# Patient Record
Sex: Female | Born: 1985
Health system: Southern US, Community
[De-identification: ages and names within clinical notes are randomized; demographics above are authoritative.]

## PROBLEM LIST (undated history)

## (undated) DIAGNOSIS — R7303 Prediabetes: Secondary | ICD-10-CM

## (undated) DIAGNOSIS — D649 Anemia, unspecified: Secondary | ICD-10-CM

## (undated) DIAGNOSIS — I10 Essential (primary) hypertension: Secondary | ICD-10-CM

## (undated) DIAGNOSIS — B009 Herpesviral infection, unspecified: Secondary | ICD-10-CM

## (undated) DIAGNOSIS — E559 Vitamin D deficiency, unspecified: Secondary | ICD-10-CM

## (undated) DIAGNOSIS — R87629 Unspecified abnormal cytological findings in specimens from vagina: Secondary | ICD-10-CM

## (undated) DIAGNOSIS — E282 Polycystic ovarian syndrome: Secondary | ICD-10-CM

## (undated) DIAGNOSIS — F419 Anxiety disorder, unspecified: Secondary | ICD-10-CM

## (undated) HISTORY — DX: Anxiety disorder, unspecified: F41.9

## (undated) HISTORY — DX: Prediabetes: R73.03

## (undated) HISTORY — PX: CHOLECYSTECTOMY: SHX55

## (undated) HISTORY — DX: Polycystic ovarian syndrome: E28.2

## (undated) HISTORY — DX: Herpesviral infection, unspecified: B00.9

## (undated) HISTORY — DX: Essential (primary) hypertension: I10

## (undated) HISTORY — DX: Anemia, unspecified: D64.9

## (undated) HISTORY — DX: Unspecified abnormal cytological findings in specimens from vagina: R87.629

## (undated) HISTORY — DX: Vitamin D deficiency, unspecified: E55.9

---

## 2015-03-06 DIAGNOSIS — K8012 Calculus of gallbladder with acute and chronic cholecystitis without obstruction: Secondary | ICD-10-CM | POA: Insufficient documentation

## 2015-03-13 DIAGNOSIS — K8020 Calculus of gallbladder without cholecystitis without obstruction: Secondary | ICD-10-CM | POA: Insufficient documentation

## 2015-10-29 DIAGNOSIS — R7303 Prediabetes: Secondary | ICD-10-CM | POA: Insufficient documentation

## 2015-10-29 DIAGNOSIS — R030 Elevated blood-pressure reading, without diagnosis of hypertension: Secondary | ICD-10-CM | POA: Insufficient documentation

## 2015-10-29 DIAGNOSIS — T7492XA Unspecified child maltreatment, confirmed, initial encounter: Secondary | ICD-10-CM | POA: Insufficient documentation

## 2016-08-30 ENCOUNTER — Emergency Department (HOSPITAL_BASED_OUTPATIENT_CLINIC_OR_DEPARTMENT_OTHER)
Admission: EM | Admit: 2016-08-30 | Discharge: 2016-08-31 | Disposition: A | Discharge status: Home / Self Care | Payer: PRIVATE HEALTH INSURANCE | Attending: Emergency Medicine | Admitting: Emergency Medicine

## 2016-08-30 ENCOUNTER — Encounter (HOSPITAL_BASED_OUTPATIENT_CLINIC_OR_DEPARTMENT_OTHER): Payer: Self-pay | Admitting: Emergency Medicine

## 2016-08-30 DIAGNOSIS — Z3A01 Less than 8 weeks gestation of pregnancy: Secondary | ICD-10-CM | POA: Diagnosis not present

## 2016-08-30 DIAGNOSIS — O039 Complete or unspecified spontaneous abortion without complication: Secondary | ICD-10-CM | POA: Diagnosis not present

## 2016-08-30 DIAGNOSIS — O209 Hemorrhage in early pregnancy, unspecified: Secondary | ICD-10-CM | POA: Diagnosis present

## 2016-08-30 LAB — CBC
HEMATOCRIT: 35.7 % — AB (ref 36.0–46.0)
HEMOGLOBIN: 11.7 g/dL — AB (ref 12.0–15.0)
MCH: 27.1 pg (ref 26.0–34.0)
MCHC: 32.8 g/dL (ref 30.0–36.0)
MCV: 82.8 fL (ref 78.0–100.0)
Platelets: 422 10*3/uL — ABNORMAL HIGH (ref 150–400)
RBC: 4.31 MIL/uL (ref 3.87–5.11)
RDW: 13.9 % (ref 11.5–15.5)
WBC: 19.4 10*3/uL — AB (ref 4.0–10.5)

## 2016-08-30 LAB — BASIC METABOLIC PANEL
ANION GAP: 13 (ref 5–15)
BUN: 15 mg/dL (ref 6–20)
CHLORIDE: 103 mmol/L (ref 101–111)
CO2: 20 mmol/L — AB (ref 22–32)
Calcium: 8.9 mg/dL (ref 8.9–10.3)
Creatinine, Ser: 0.94 mg/dL (ref 0.44–1.00)
GFR calc non Af Amer: >60 mL/min (ref >60)
Glucose, Bld: 141 mg/dL — ABNORMAL HIGH (ref 65–99)
POTASSIUM: 3.1 mmol/L — AB (ref 3.5–5.1)
SODIUM: 136 mmol/L (ref 135–145)

## 2016-08-30 MED ORDER — HYDROMORPHONE HCL 1 MG/ML IJ SOLN
1.0000 mg | Freq: Once | INTRAMUSCULAR | Status: AC
  Administered 2016-08-30: 1 mg via INTRAVENOUS
  Filled 2016-08-30: qty 1

## 2016-08-30 MED ORDER — HYDROMORPHONE HCL 1 MG/ML IJ SOLN: 1.0000 mg | Freq: Once | INTRAMUSCULAR | Status: DC

## 2016-08-30 MED ORDER — ONDANSETRON HCL 4 MG/2ML IJ SOLN
4.0000 mg | Freq: Once | INTRAMUSCULAR | Status: AC
  Administered 2016-08-30: 4 mg via INTRAVENOUS
  Filled 2016-08-30: qty 2

## 2016-08-30 NOTE — ED Notes (Signed)
Pt restless, writhing, hyperventilating, c/o contractions, EDPA into room.

## 2016-08-30 NOTE — ED Notes (Signed)
Pt went to OB last week and no fetal heart tones heard and was told to come back this week. Pt went back this Wednesday was given cytotec and didn't pass anything. PT  went ED on Thursday and got hydrocodone for the pain.  Patient took a second dose of cytotec and thought the  Hydrocodone would help the pain but is not helping this time. Pt grunting and moaning with contraction like pain.

## 2016-08-30 NOTE — ED Notes (Signed)
Pt back to room in w/c from triage, self transfers, hyperventilating and moaning, follows commands.

## 2016-08-30 NOTE — ED Provider Notes (Signed)
[place] [facility] Provider Note   CSN: [id number] Arrival date & time: 08/30/16  2051  By signing my name below, I, Courtney Mooney, attest that this documentation has been prepared under the direction and in the presence of Courtney Jaliza Seifried PA-C. Electronically Signed: Charolotte Mooney, Scribe. 08/30/16. 9:38 PM.   History   Chief Complaint Chief Complaint  Patient presents with  . Vaginal Bleeding    HPI HPI Comments: Courtney Mooney is a 31 y.o. female, currently approximately [redacted] weeks pregnant, G2P1, who presents to the Emergency Department complaining of gradually worsening, persistent, moderate-severe abdominal pain that worsened 2 hours ago, but began 3 days ago. Pt has associated back pain. Patient saw her OB/GYN on 1/31 days ago and had an ultrasound. At this time she was told that her pregnancy was no longer viable. She states that she was given Rhogam. She was given a prescription for Cytotec. She first took the Cytotec on 08/28/16. She had severe pain and went to the emergency department in [place]. She was treated with fentanyl, toradol and hydrocodone for home.  The history is provided by the patient. No language interpreter was used.    History reviewed. No pertinent past medical history.  There are no active problems to display for this patient.   Past Surgical History:  Procedure Laterality Date  . CHOLECYSTECTOMY      OB History    Gravida Para Term Preterm AB Living   1             SAB TAB Ectopic Multiple Live Births                   Home Medications    Prior to Admission medications   Not on File    Family History History reviewed. No pertinent family history.  Social History Social History  Substance Use Topics  . Smoking status: Never Smoker  . Smokeless tobacco: Never Used  . Alcohol use No     Allergies   Patient has no known allergies.   Review of Systems Review of Systems  Constitutional: Negative for fever.  HENT: Negative for  rhinorrhea and sore throat.   Eyes: Negative for redness.  Respiratory: Negative for cough.   Cardiovascular: Negative for chest pain.  Gastrointestinal: Positive for abdominal pain. Negative for diarrhea, nausea and vomiting.  Genitourinary: Positive for vaginal bleeding. Negative for dysuria.  Musculoskeletal: Positive for back pain. Negative for myalgias.  Skin: Negative for rash.  Neurological: Negative for headaches.     Physical Exam Updated Vital Signs BP 118/59 (BP Location: Right Arm)   Pulse 107   Temp 99.2 F (37.3 C) (Oral)   Resp 18   Ht 5\' 5"  (1.651 m)   Wt (!) 321 lb (145.6 kg)   LMP 08/30/2016   SpO2 100%   BMI 53.42 kg/m   Physical Exam  Constitutional: She appears well-developed and well-nourished. She appears distressed (Patient with contraction-like pain).  HENT:  Head: Normocephalic and atraumatic.  Mouth/Throat: Oropharynx is clear and moist.  Eyes: Conjunctivae are normal. Right eye exhibits no discharge. Left eye exhibits no discharge.  Neck: Normal range of motion. Neck supple.  Cardiovascular: Normal rate, regular rhythm and normal heart sounds.   No murmur heard. Pulmonary/Chest: Effort normal and breath sounds normal. No respiratory distress. She has no wheezes. She has no rales.  Abdominal: Soft. She exhibits no mass. There is no tenderness. There is no guarding.  Genitourinary: Vagina normal. There is no rash or lesion  on the right labia. There is no rash or lesion on the left labia. Uterus is not tender. Cervix exhibits discharge (blood). Right adnexum displays no mass and no tenderness. Left adnexum displays no mass and no tenderness.  Genitourinary Comments: Discharge of blood through cervix, nondilated.  Neurological: She is alert.  Skin: Skin is warm and dry.  Psychiatric: She has a normal mood and affect.  Nursing note and vitals reviewed.    ED Treatments / Results   DIAGNOSTIC STUDIES: Oxygen Saturation is 100% on room air, normal  by my interpretation.    COORDINATION OF CARE: 9:19 PM Discussed treatment plan with pt at bedside and pt agreed to plan, which includes treatment for pain, and a pelvic exam.  Labs (all labs ordered are listed, but only abnormal results are displayed) Labs Reviewed  WET PREP, GENITAL - Abnormal; Notable for the following:       Result Value   WBC, Wet Prep HPF POC FEW (*)    All other components within normal limits  CBC - Abnormal; Notable for the following:    WBC 19.4 (*)    Hemoglobin 11.7 (*)    HCT 35.7 (*)    Platelets 422 (*)    All other components within normal limits  BASIC METABOLIC PANEL - Abnormal; Notable for the following:    Potassium 3.1 (*)    CO2 20 (*)    Glucose, Bld 141 (*)    All other components within normal limits  ABO/RH  GC/CHLAMYDIA PROBE AMP ([place]) NOT AT [facility]    Procedures Procedures (including critical care time)  Medications Ordered in ED Medications  HYDROmorphone (DILAUDID) injection 1 mg (1 mg Intravenous Given 08/30/16 2125)  ondansetron (ZOFRAN) injection 4 mg (4 mg Intravenous Given 08/30/16 2125)  HYDROmorphone (DILAUDID) injection 1 mg (1 mg Intravenous Given 08/30/16 2231)  oxyCODONE-acetaminophen (PERCOCET/ROXICET) 5-325 MG per tablet 2 tablet (2 tablets Oral Given 08/31/16 0028)     Initial Impression / Assessment and Plan / ED Course  I have reviewed the triage vital signs and the nursing notes.  Pertinent labs & imaging results that were available during my care of the patient were reviewed by me and considered in my medical decision making (see chart for details).       Vital signs reviewed and are as follows: BP 132/74   Pulse 81   Temp 99.2 F (37.3 C) (Oral)   Resp 21   Ht 5\' 5"  (1.651 m)   Wt (!) 145.6 kg   LMP 08/30/2016   SpO2 100%   BMI 53.42 kg/m   Reviewed [facility] from [facility]. Patient's pain control in emergency department with parenteral narcotics. Pelvic exam performed with nurse  chaperone.  Patient recently received RhoGAM. White blood cell count noted to be elevated, likely due to stress and pain. She had a white blood cell count of 17,000 on 2/1.  Pain well controlled in emergency department after oral medications. Patient is comfortable with discharge to home at this time. We discussed all results. She will call her OB/GYN tomorrow for follow-up. She has follow-up scheduled for Wednesday.  Patient encouraged to return to the emergency department with worsening pain, fever, vomiting, if she has heavier bleeding. She verbalizes understanding and agrees with plan. Counseled to use hydrocodone prescribed to her previous visit and to use this on a scheduled basis for the next 24 hours. Also encouraged to use ibuprofen on a scheduled basis.  Final Clinical Impressions(s) / ED Diagnoses  Final diagnoses:  Miscarriage   Patient with spontaneous miscarriage. She had severe pain tonight after taking another dose of Cytotec. Leukocytosis noted. Likely due to distress. Patient does not have any unexpected tenderness on exam. No fevers. White blood cell count was elevated on 2/1 at her emergency department visit. Mild hypokalemia noted. Patient much more comfortable at time of discharge. She has appropriate follow-up. She seems reliable to return with worsening.  New Prescriptions New Prescriptions   No medications on file   I personally performed the services described in this documentation, which was scribed in my presence. The recorded information has been reviewed and is accurate.     Carlisle Cater, PA-C 08/31/16 [place], DO 08/31/16 [id number]

## 2016-08-30 NOTE — ED Notes (Signed)
Pt resting comfortably, NAD, calm, SPO2 intermittently low, recent sedating meds, states, "I probably have sleep apnea", placed on O2 2L [place], VSS.

## 2016-08-30 NOTE — ED Notes (Signed)
ED PA at BS 

## 2016-08-30 NOTE — ED Triage Notes (Addendum)
Patient states that she went to the dr and her fetus was no longer viable. The patient was given cytotec to have a miscarrage - the first dose did not work. She gave herself a second dose this am and now is having vaginal bleeding and cramping

## 2016-08-31 LAB — WET PREP, GENITAL
CLUE CELLS WET PREP: NONE SEEN
Sperm: NONE SEEN
Trich, Wet Prep: NONE SEEN
Yeast Wet Prep HPF POC: NONE SEEN

## 2016-08-31 MED ORDER — OXYCODONE-ACETAMINOPHEN 5-325 MG PO TABS
2.0000 | ORAL_TABLET | Freq: Once | ORAL | Status: AC
  Administered 2016-08-31: 2 via ORAL
  Filled 2016-08-31: qty 2

## 2016-08-31 NOTE — ED Notes (Signed)
PA aware that that pt is ABO/RH neg

## 2016-08-31 NOTE — ED Notes (Signed)
Given PO water per request, declined snack, VSS, no changes.

## 2016-08-31 NOTE — Discharge Instructions (Signed)
Please read and follow all provided instructions.  Your diagnoses today include:  1. Miscarriage     Tests performed today include:  Blood counts and electrolytes  Wet prep - no vaginal infections identified  Vital signs. See below for your results today.   Medications prescribed:   None  Take any prescribed medications only as directed.  Home care instructions:   Follow any educational materials contained in this packet.   Use home pain medications and ibuprofen on a schedule for the next 24-48 hours to keep your symptoms under control.   Follow-up instructions: Please follow-up with your OB/GYN in the next 2 days for further evaluation of your symptoms.    Return instructions:  SEEK IMMEDIATE MEDICAL ATTENTION IF:  The pain does not go away or becomes severe   If you have a larger amount of blood being passed through your vagina  A temperature above 101F develops   Repeated vomiting occurs (multiple episodes)   The pain becomes localized to portions of the abdomen. The right side could possibly be appendicitis. In an adult, the left lower portion of the abdomen could be colitis or diverticulitis.   Blood is being passed in stools or vomit (bright red or black tarry stools)   You develop chest pain, difficulty breathing, dizziness or fainting, or become confused, poorly responsive, or inconsolable (young children)  If you have any other emergent concerns regarding your health  Additional Information: Abdominal (belly) pain can be caused by many things. Your caregiver performed an examination and possibly ordered blood/urine tests and imaging (CT scan, x-rays, ultrasound). Many cases can be observed and treated at home after initial evaluation in the emergency department. Even though you are being discharged home, abdominal pain can be unpredictable. Therefore, you need a repeated exam if your pain does not resolve, returns, or worsens. Most patients with abdominal  pain don't have to be admitted to the hospital or have surgery, but serious problems like appendicitis and gallbladder attacks can start out as nonspecific pain. Many abdominal conditions cannot be diagnosed in one visit, so follow-up evaluations are very important.  Your vital signs today were: BP 132/74    Pulse 81    Temp 99.2 F (37.3 C) (Oral)    Resp 21    Ht 5\' 5"  (1.651 m)    Wt (!) 145.6 kg    LMP 08/30/2016    SpO2 100%    BMI 53.42 kg/m  If your blood pressure (bp) was elevated above 135/85 this visit, please have this repeated by your doctor within one month. --------------

## 2016-08-31 NOTE — ED Notes (Signed)
EDPA in to see pt, at BS.  

## 2016-08-31 NOTE — ED Notes (Signed)
Pt reports:  "received Rhogam yesterday at [facility]".

## 2016-09-01 LAB — GC/CHLAMYDIA PROBE AMP (~~LOC~~) NOT AT ARMC
CHLAMYDIA, DNA PROBE: NEGATIVE
NEISSERIA GONORRHEA: NEGATIVE

## 2016-09-01 LAB — ABO/RH
ABO/RH(D): O NEG
Antibody Screen: POSITIVE
DAT, IgG: NEGATIVE

## 2016-12-01 ENCOUNTER — Ambulatory Visit (INDEPENDENT_AMBULATORY_CARE_PROVIDER_SITE_OTHER): Payer: PRIVATE HEALTH INSURANCE | Admitting: Obstetrics & Gynecology

## 2016-12-01 ENCOUNTER — Encounter: Payer: Self-pay | Admitting: Obstetrics & Gynecology

## 2016-12-01 VITALS — BP 125/81 | HR 73 | Wt 308.0 lb

## 2016-12-01 DIAGNOSIS — Z3689 Encounter for other specified antenatal screening: Secondary | ICD-10-CM

## 2016-12-01 DIAGNOSIS — O3441 Maternal care for other abnormalities of cervix, first trimester: Secondary | ICD-10-CM

## 2016-12-01 DIAGNOSIS — Z113 Encounter for screening for infections with a predominantly sexual mode of transmission: Secondary | ICD-10-CM

## 2016-12-01 DIAGNOSIS — O10911 Unspecified pre-existing hypertension complicating pregnancy, first trimester: Secondary | ICD-10-CM

## 2016-12-01 DIAGNOSIS — O99210 Obesity complicating pregnancy, unspecified trimester: Secondary | ICD-10-CM

## 2016-12-01 DIAGNOSIS — O0990 Supervision of high risk pregnancy, unspecified, unspecified trimester: Secondary | ICD-10-CM | POA: Insufficient documentation

## 2016-12-01 DIAGNOSIS — O99211 Obesity complicating pregnancy, first trimester: Secondary | ICD-10-CM

## 2016-12-01 DIAGNOSIS — O34219 Maternal care for unspecified type scar from previous cesarean delivery: Secondary | ICD-10-CM | POA: Insufficient documentation

## 2016-12-01 DIAGNOSIS — I10 Essential (primary) hypertension: Secondary | ICD-10-CM | POA: Insufficient documentation

## 2016-12-01 DIAGNOSIS — Z3480 Encounter for supervision of other normal pregnancy, unspecified trimester: Secondary | ICD-10-CM

## 2016-12-01 DIAGNOSIS — O10919 Unspecified pre-existing hypertension complicating pregnancy, unspecified trimester: Secondary | ICD-10-CM

## 2016-12-01 DIAGNOSIS — O3440 Maternal care for other abnormalities of cervix, unspecified trimester: Secondary | ICD-10-CM

## 2016-12-01 DIAGNOSIS — Z124 Encounter for screening for malignant neoplasm of cervix: Secondary | ICD-10-CM

## 2016-12-01 DIAGNOSIS — B009 Herpesviral infection, unspecified: Secondary | ICD-10-CM

## 2016-12-01 DIAGNOSIS — O98511 Other viral diseases complicating pregnancy, first trimester: Secondary | ICD-10-CM

## 2016-12-01 DIAGNOSIS — O98519 Other viral diseases complicating pregnancy, unspecified trimester: Principal | ICD-10-CM

## 2016-12-01 MED ORDER — ASPIRIN 81 MG PO CHEW: 81.0000 mg | CHEWABLE_TABLET | Freq: Every day | ORAL | Status: DC

## 2016-12-01 MED ORDER — PREPLUS 27-1 MG PO TABS
1.0000 | ORAL_TABLET | Freq: Every day | ORAL | 13 refills | Status: DC
Start: 2016-12-01 — End: 2019-05-02

## 2016-12-01 NOTE — Patient Instructions (Signed)
Hypertension During Pregnancy °Hypertension, commonly called high blood pressure, is when the force of blood pumping through your arteries is too strong. Arteries are blood vessels that carry blood from the heart throughout the body. Hypertension during pregnancy can cause problems for you and your baby. Your baby may be born early (prematurely) or may not weigh as much as he or she should at birth. Very bad cases of hypertension during pregnancy can be life-threatening. °Different types of hypertension can occur during pregnancy. These include: °· Chronic hypertension. This happens when: °¨ You have hypertension before pregnancy and it continues during pregnancy. °¨ You develop hypertension before you are [redacted] weeks pregnant, and it continues during pregnancy. °· Gestational hypertension. This is hypertension that develops after the 20th week of pregnancy. °· Preeclampsia, also called toxemia of pregnancy. This is a very serious type of hypertension that develops only during pregnancy. It affects the whole body, and it can be very dangerous for you and your baby. °Gestational hypertension and preeclampsia usually go away within 6 weeks after your baby is born. Women who have hypertension during pregnancy have a greater chance of developing hypertension later in life or during future pregnancies. °What are the causes? °The exact cause of hypertension is not known. °What increases the risk? °There are certain factors that make it more likely for you to develop hypertension during pregnancy. These include: °· Having hypertension during a previous pregnancy or prior to pregnancy. °· Being overweight. °· Being older than age 40. °· Being pregnant for the first time or being pregnant with more than one baby. °· Becoming pregnant using fertilization methods such as IVF (in vitro fertilization). °· Having diabetes, kidney problems, or systemic lupus erythematosus. °· Having a family history of hypertension. °What are the  signs or symptoms? °Chronic hypertension and gestational hypertension rarely cause symptoms. Preeclampsia causes symptoms, which may include: °· Increased protein in your urine. Your health care provider will check for this at every visit before you give birth (prenatal visit). °· Severe headaches. °· Sudden weight gain. °· Swelling of the hands, face, legs, and feet. °· Nausea and vomiting. °· Vision problems, such as blurred or double vision. °· Numbness in the face, arms, legs, and feet. °· Dizziness. °· Slurred speech. °· Sensitivity to bright lights. °· Abdominal pain. °· Convulsions. °How is this diagnosed? °You may be diagnosed with hypertension during a routine prenatal exam. At each prenatal visit, you may: °· Have a urine test to check for high amounts of protein in your urine. °· Have your blood pressure checked. A blood pressure reading is recorded as two numbers, such as "120 over 80" (or 120/80). The first ("top") number is called the systolic pressure. It is a measure of the pressure in your arteries when your heart beats. The second ("bottom") number is called the diastolic pressure. It is a measure of the pressure in your arteries as your heart relaxes between beats. Blood pressure is measured in a unit called mm Hg. A normal blood pressure reading is: °¨ Systolic: below 120. °¨ Diastolic: below 80. °The type of hypertension that you are diagnosed with depends on your test results and when your symptoms developed. °· Chronic hypertension is usually diagnosed before 20 weeks of pregnancy. °· Gestational hypertension is usually diagnosed after 20 weeks of pregnancy. °· Hypertension with high amounts of protein in the urine is diagnosed as preeclampsia. °· Blood pressure measurements that stay above 160 systolic, or above 110 diastolic, are signs of severe preeclampsia. °  How is this treated? °Treatment for hypertension during pregnancy varies depending on the type of hypertension you have and how  serious it is. °· If you take medicines called ACE inhibitors to treat chronic hypertension, you may need to switch medicines. ACE inhibitors should not be taken during pregnancy. °· If you have gestational hypertension, you may need to take blood pressure medicine. °· If you are at risk for preeclampsia, your health care provider may recommend that you take a low-dose aspirin every day to prevent high blood pressure during your pregnancy. °· If you have severe preeclampsia, you may need to be hospitalized so you and your baby can be monitored closely. You may also need to take medicine (magnesium sulfate) to prevent seizures and to lower blood pressure. This medicine may be given as an injection or through an IV tube. °· In some cases, if your condition gets worse, you may need to deliver your baby early. °Follow these instructions at home: °Eating and drinking °· Drink enough fluid to keep your urine clear or pale yellow. °· Eat a healthy diet that is low in salt (sodium). Do not add salt to your food. Check food labels to see how much sodium a food or beverage contains. °Lifestyle °· Do not use any products that contain nicotine or tobacco, such as cigarettes and e-cigarettes. If you need help quitting, ask your health care provider. °· Do not use alcohol. °· Avoid caffeine. °· Avoid stress as much as possible. Rest and get plenty of sleep. °General instructions °· Take over-the-counter and prescription medicines only as told by your health care provider. °· While lying down, lie on your left side. This keeps pressure off your baby. °· While sitting or lying down, raise (elevate) your feet. Try putting some pillows under your lower legs. °· Exercise regularly. Ask your health care provider what kinds of exercise are best for you. °· Keep all prenatal and follow-up visits as told by your health care provider. This is important. °Contact a health care provider if: °· You have symptoms that your health care provider  told you may require more treatment or monitoring, such as: °¨ Fever. °¨ Vomiting. °¨ Headache. °Get help right away if: °· You have severe abdominal pain or vomiting that does not get better with treatment. °· You suddenly develop swelling in your hands, ankles, or face. °· You gain 4 lbs (1.8 kg) or more in 1 week. °· You develop vaginal bleeding, or you have blood in your urine. °· You do not feel your baby moving as much as usual. °· You have blurred or double vision. °· You have muscle twitching or sudden tightening (spasms). °· You have shortness of breath. °· Your lips or fingernails turn blue. °This information is not intended to replace advice given to you by your health care provider. Make sure you discuss any questions you have with your health care provider. °Document Released: 04/01/2011 Document Revised: 02/01/2016 Document Reviewed: 12/28/2015 °Elsevier Interactive Patient Education © 2017 [employer] ° °

## 2016-12-01 NOTE — Progress Notes (Signed)
  Subjective:    Courtney Mooney is being seen today for her first obstetrical visit.  This is a planned pregnancy. She is at [redacted]w[redacted]d gestation. Her obstetrical history is significant for obesity and chronic HTN. Relationship with FOB: spouse, living together. Patient does intend to breast feed. Pregnancy history fully reviewed. Pt had an SAB 08/2016 (early)  Patient reports nausea.  Review of Systems:   Review of Systems see problem list  Objective:     BP 125/81   Pulse 73   Wt (!) 308 lb (139.7 kg)   LMP 10/07/2016   BMI 51.25 kg/m  Physical Exam  Exam  General Appearance:    Alert, cooperative, no distress, appears stated age  Head:    Normocephalic, without obvious abnormality, atraumatic  Eyes:    conjunctiva/corneas clear, EOM's intact, both eyes  Ears:    Normal external ear canals, both ears  Nose:   Nares normal, septum midline, mucosa normal, no drainage    or sinus tenderness  Throat:   Lips, mucosa, and tongue normal; teeth and gums normal  Neck:   Supple, symmetrical, trachea midline, no adenopathy;    thyroid:  no enlargement/tenderness/nodules  Back:     Symmetric, no curvature, ROM normal, no CVA tenderness  Lungs:     Clear to auscultation bilaterally, respirations unlabored  Chest Wall:    No tenderness or deformity   Heart:    Regular rate and rhythm, S1 and S2 normal, no murmur, rub   or gallop  Breast Exam:    No tenderness, masses, or nipple abnormality  Abdomen:     Soft, non-tender, bowel sounds active all four quadrants,    no masses, no organomegaly; obese  Genitalia:    Normal female without lesion, discharge or tenderness     Extremities:   Extremities normal, atraumatic, no cyanosis or edema  Pulses:   2+ and symmetric all extremities  Skin:   Skin color, texture, turgor normal, no rashes or lesions       Assessment:    Pregnancy: G3P1011 1. Supervision of other normal pregnancy, antepartum  - Prenatal Vit-Fe Fumarate-FA (PREPLUS) 27-1 MG  TABS; Take 1 tablet by mouth daily.  Dispense: 30 tablet; Refill: 13 - Protein / creatinine ratio, urine - CULTURE, URINE COMPREHENSIVE - Obstetric Panel, Including HIV - Sickle Cell Scr - Cytology - PAP - GC/Chlamydia probe amp ([place])not at [facility]  2. Previous cesarean delivery, antepartum This was for h/o HSV with prodromal sx. Pt did not labor  3. Cervical abnormality affecting pregnancy, antepartum h/o cryotherapy of the cervix at age 4 years of age  13. Chronic hypertension during pregnancy, antepartum  - aspirin chewable tablet 81 mg; Chew 1 tablet (81 mg total) by mouth daily. Hold Labetalol for now  5. Herpes infection during pregnancy, antepartum Suppression to begin at 36 weeks or sooner prn  6. Maternal obesity, antepartum      Plan:     Initial labs drawn including Pr:Cr ratio. Prenatal vitamins. Begin baby ASA Problem list reviewed and updated. AFP3 discussed: requested.  Pt and husband wish to discuss and decide on genetic screen. They are aware that if they delay they may not be abelto get 1st trimester screening  Role of ultrasound in pregnancy discussed; fetal survey: requested. Amniocentesis discussed: not indicated. Follow up in 4 weeks. 60% of 65 min visit spent on counseling and coordination of care.  Stop labetalol for now   Lavonia Drafts 12/01/2016

## 2016-12-01 NOTE — Progress Notes (Signed)
CLINICAL DATA:  Pregnant patient in 1st trimester pregnancy with no complaints  EXAM: Transvaginal OB ULTRASOUND   TECHNIQUE:  Transvaginal ultrasound was performed for complete evaluation of the gestation as well as the maternal uterus, adnexal regions, and pelvic cul-de-sac.   FINDINGS: Intrauterine gestational sac: Single Yolk sac:  Present tEmbryo:present Cardiac Activity: present Heart Rate: 150 bpm.  CRL:1.15 cm 7 Weeks 2 days  Kathrene Alu RN

## 2016-12-02 LAB — PROTEIN / CREATININE RATIO, URINE
CREATININE, UR: 60.4 mg/dL
CREATININE, UR: 81 mg/dL
Protein, Ur: 4.8 mg/dL
Protein, Ur: <4 mg/dL
Protein/Creat Ratio: 59 mg/g creat (ref 0–200)

## 2016-12-02 LAB — OBSTETRIC PANEL, INCLUDING HIV
Antibody Screen: NEGATIVE
BASOS ABS: 0 10*3/uL (ref 0.0–0.2)
Basos: 0 %
EOS (ABSOLUTE): 0 10*3/uL (ref 0.0–0.4)
EOS: 0 %
HEMOGLOBIN: 12.5 g/dL (ref 11.1–15.9)
HIV Screen 4th Generation wRfx: NONREACTIVE
Hematocrit: 38.6 % (ref 34.0–46.6)
Hepatitis B Surface Ag: NEGATIVE
IMMATURE GRANS (ABS): 0 10*3/uL (ref 0.0–0.1)
IMMATURE GRANULOCYTES: 0 %
LYMPHS ABS: 2.3 10*3/uL (ref 0.7–3.1)
LYMPHS: 19 %
MCH: 27.1 pg (ref 26.6–33.0)
MCHC: 32.4 g/dL (ref 31.5–35.7)
MCV: 84 fL (ref 79–97)
MONOS ABS: 0.6 10*3/uL (ref 0.1–0.9)
Monocytes: 5 %
NEUTROS PCT: 76 %
Neutrophils Absolute: 8.9 10*3/uL — ABNORMAL HIGH (ref 1.4–7.0)
PLATELETS: 386 10*3/uL — AB (ref 150–379)
RBC: 4.62 x10E6/uL (ref 3.77–5.28)
RDW: 13.7 % (ref 12.3–15.4)
RPR Ser Ql: NONREACTIVE
Rh Factor: NEGATIVE
Rubella Antibodies, IGG: 2.56 index (ref >0.99)
WBC: 11.8 10*3/uL — AB (ref 3.4–10.8)

## 2016-12-02 LAB — GC/CHLAMYDIA PROBE AMP (~~LOC~~) NOT AT ARMC
Chlamydia: NEGATIVE
NEISSERIA GONORRHEA: NEGATIVE

## 2016-12-02 LAB — SICKLE CELL SCREEN: Sickle Cell Screen: NEGATIVE

## 2016-12-03 ENCOUNTER — Encounter: Payer: Self-pay | Admitting: Obstetrics & Gynecology

## 2016-12-03 ENCOUNTER — Other Ambulatory Visit: Payer: Self-pay | Admitting: Obstetrics & Gynecology

## 2016-12-03 DIAGNOSIS — O26899 Other specified pregnancy related conditions, unspecified trimester: Secondary | ICD-10-CM

## 2016-12-03 DIAGNOSIS — Z6791 Unspecified blood type, Rh negative: Secondary | ICD-10-CM | POA: Insufficient documentation

## 2016-12-03 DIAGNOSIS — B3731 Acute candidiasis of vulva and vagina: Secondary | ICD-10-CM

## 2016-12-03 DIAGNOSIS — B373 Candidiasis of vulva and vagina: Secondary | ICD-10-CM

## 2016-12-03 LAB — CYTOLOGY - PAP
DIAGNOSIS: NEGATIVE
HPV (WINDOPATH): NOT DETECTED

## 2016-12-03 MED ORDER — TERCONAZOLE 0.4 % VA CREA: 1.0000 | TOPICAL_CREAM | Freq: Every day | VAGINAL | 0 refills | Status: DC

## 2016-12-04 ENCOUNTER — Telehealth: Payer: Self-pay

## 2016-12-04 LAB — CULTURE, URINE COMPREHENSIVE

## 2016-12-04 NOTE — Telephone Encounter (Signed)
Patient made aware that her pap smear showed she has a yeast infection. Patient made aware medication for treatment has been sent to her pharmacy. Kathrene Alu RNBSN

## 2016-12-29 ENCOUNTER — Ambulatory Visit (INDEPENDENT_AMBULATORY_CARE_PROVIDER_SITE_OTHER): Payer: PRIVATE HEALTH INSURANCE | Admitting: Obstetrics & Gynecology

## 2016-12-29 ENCOUNTER — Encounter: Payer: PRIVATE HEALTH INSURANCE | Admitting: Obstetrics & Gynecology

## 2016-12-29 VITALS — BP 134/78 | HR 78 | Wt 308.0 lb

## 2016-12-29 DIAGNOSIS — O3441 Maternal care for other abnormalities of cervix, first trimester: Secondary | ICD-10-CM

## 2016-12-29 DIAGNOSIS — O10911 Unspecified pre-existing hypertension complicating pregnancy, first trimester: Secondary | ICD-10-CM

## 2016-12-29 DIAGNOSIS — O26899 Other specified pregnancy related conditions, unspecified trimester: Principal | ICD-10-CM

## 2016-12-29 DIAGNOSIS — Z6791 Unspecified blood type, Rh negative: Secondary | ICD-10-CM

## 2016-12-29 DIAGNOSIS — O09891 Supervision of other high risk pregnancies, first trimester: Secondary | ICD-10-CM

## 2016-12-29 DIAGNOSIS — O98311 Other infections with a predominantly sexual mode of transmission complicating pregnancy, first trimester: Secondary | ICD-10-CM

## 2016-12-29 DIAGNOSIS — O99211 Obesity complicating pregnancy, first trimester: Secondary | ICD-10-CM

## 2016-12-29 DIAGNOSIS — A630 Anogenital (venereal) warts: Secondary | ICD-10-CM

## 2016-12-29 DIAGNOSIS — O34219 Maternal care for unspecified type scar from previous cesarean delivery: Secondary | ICD-10-CM

## 2016-12-29 DIAGNOSIS — O10919 Unspecified pre-existing hypertension complicating pregnancy, unspecified trimester: Secondary | ICD-10-CM

## 2016-12-29 DIAGNOSIS — Z3480 Encounter for supervision of other normal pregnancy, unspecified trimester: Secondary | ICD-10-CM

## 2016-12-29 DIAGNOSIS — O3440 Maternal care for other abnormalities of cervix, unspecified trimester: Secondary | ICD-10-CM

## 2016-12-29 DIAGNOSIS — O99210 Obesity complicating pregnancy, unspecified trimester: Secondary | ICD-10-CM

## 2016-12-29 DIAGNOSIS — O98319 Other infections with a predominantly sexual mode of transmission complicating pregnancy, unspecified trimester: Secondary | ICD-10-CM

## 2016-12-29 MED ORDER — IMIQUIMOD 5 % EX CREA: TOPICAL_CREAM | CUTANEOUS | 5 refills | Status: DC

## 2016-12-29 NOTE — Progress Notes (Signed)
   PRENATAL VISIT NOTE  Subjective:  Courtney Mooney is a 31 y.o. G3P1011 at [redacted]w[redacted]d being seen today for ongoing prenatal care.  She is currently monitored for the following issues for this high-risk pregnancy and has Supervision of other normal pregnancy, antepartum; Previous cesarean delivery, antepartum; Cervical abnormality affecting pregnancy, antepartum; Chronic hypertension during pregnancy, antepartum; Maternal obesity, antepartum; and Rh negative state in antepartum period on her problem list.  Patient reports pt reprots large wart on vulva that she wants treated. .  Contractions: Not present. Vag. Bleeding: None.  Movement: Absent. Denies leaking of fluid.   The following portions of the patient's history were reviewed and updated as appropriate: allergies, current medications, past family history, past medical history, past social history, past surgical history and problem list. Problem list updated.  Objective:   Vitals:   12/29/16 0912  BP: 134/78  Pulse: 78  Weight: (!) 308 lb (139.7 kg)    Fetal Status:     Movement: Absent     General:  Alert, oriented and cooperative. Patient is in no acute distress.  Skin: Skin is warm and dry. No rash noted.   Cardiovascular: Normal heart rate noted  Respiratory: Normal respiratory effort, no problems with respiration noted  Abdomen: Soft, gravid, appropriate for gestational age. Pain/Pressure: Absent     Pelvic:  Cervical exam deferred        Extremities: Normal range of motion.  Edema: None  Mental Status: Normal mood and affect. Normal behavior. Normal judgment and thought content.   Assessment and Plan:  Pregnancy: [id number] at [redacted]w[redacted]d  1. Supervision of other normal pregnancy, antepartum  2. Rh negative state in antepartum period Needs Rhogam at 28 weeks  3. Previous cesarean delivery, antepartum Desires TOLAC  4. Maternal obesity, antepartum  5. Chronic hypertension during pregnancy, antepartum Taking baby ASA  6.  Cervical abnormality affecting pregnancy, antepartum Has a genital wart- rec Aldara 3x/week  Preterm labor symptoms and general obstetric precautions including but not limited to vaginal bleeding, contractions, leaking of fluid and fetal movement were reviewed in detail with the patient. Please refer to After Visit Summary for other counseling recommendations.  F/u in 5 weeks  Lavonia Drafts, MD

## 2016-12-29 NOTE — Patient Instructions (Signed)
Imiquimod skin cream What is this medicine? IMIQUIMOD (i mi KWI mod) cream is used to treat external genital or anal warts. It is also used to treat other skin conditions such as actinic keratosis and certain types of skin cancer. This medicine may be used for other purposes; ask your health care provider or pharmacist if you have questions. COMMON BRAND NAME(S): Tawni Levy What should I tell my health care provider before I take this medicine? They need to know if you have any of these conditions: -decreased immune function -an unusual or allergic reaction to imiquimod, other medicines, foods, dyes, or preservatives -pregnant or trying to get pregnant -breast-feeding How should I use this medicine? This medicine is for external use only. Do not take by mouth. Follow the directions on the prescription label. Apply just before bedtime. Wash your hands before and after use. Apply a thin layer of cream and massage gently into the affected areas until no longer visible. Do not use in the mouth, eyes or the vagina. Use this medicine only on the affected area as directed by your health care provider. Do not use for longer than prescribed. It is important not to use more medicine than prescribed. To do so may increase the chance of side effects. Talk to your pediatrician regarding the use of this medicine in children. While this drug may be prescribed for children as young as 92 years of age for selected conditions, precautions do apply. Overdosage: If you think you have taken too much of this medicine contact a poison control center or emergency room at once. NOTE: This medicine is only for you. Do not share this medicine with others. What if I miss a dose? If you miss a dose, use it as soon as you can. If it is almost time for your next dose, use only that dose. Do not use double or extra doses. What may interact with this medicine? Interactions are not expected. Do not use any other medicines on  the treated area without asking your doctor or health care professional. This list may not describe all possible interactions. Give your health care provider a list of all the medicines, herbs, non-prescription drugs, or dietary supplements you use. Also tell them if you smoke, drink alcohol, or use illegal drugs. Some items may interact with your medicine. What should I watch for while using this medicine? Visit your health care professional for regular checks on your progress. Do not use this medicine until the skin has healed from any other drug (example: podofilox or podophyllin resin) or surgical skin treatment. Females should receive regular pelvic exams while being treated for genital warts. Most patients see improvement within 4 weeks. It may take up to 16 weeks to see a full clearing of the warts. This medicine is not a cure. New warts may develop during or after treatment. Avoid sexual (genital, anal, oral) contact while the cream is on the skin. If warts are visible in the genital area, sexual contact should be avoided until the warts are treated. The use of latex condoms during sexual contact may reduce, but not entirely prevent, infecting others. This medicine may weaken condoms, diaphragms, cervical caps or other barrier devices and make them less effective as birth control. Do not cover the treated area with an airtight bandage. Cotton gauze dressings can be used. Cotton underwear can be worn after using this medicine on the genital or anal area. Actinic keratoses that were not seen before may appear during treatment and  may later go away. The treatment area and surrounding area may lighten or darken after treatment with this medicine. These skin color changes may be permanent in some patients. If you experience a skin reaction at the treatment site that interferes or prevents you from doing any daily activity, contact your health care provider. You may need a rest period from treatment.  Treatment may be restarted once the reaction has gotten better as recommended by your doctor or health care professional. This medicine can make you more sensitive to the sun. Keep out of the sun. If you cannot avoid being in the sun, wear protective clothing and use sunscreen. Do not use sun lamps or tanning beds/booths. What side effects may I notice from receiving this medicine? Side effects that you should report to your doctor or health care professional as soon as possible: -open sores with or without drainage -skin infection -skin rash -unusual or severe skin reaction Side effects that usually do not require medical attention (report to your doctor or health care professional if they continue or are bothersome): -burning or itching -redness of the skin (very common but is usually not painful or harmful) -scabbing, crusting, or peeling skin -skin that becomes hard or thickened -swelling of the skin This list may not describe all possible side effects. Call your doctor for medical advice about side effects. You may report side effects to FDA at 1-800-FDA-1088. Where should I keep my medicine? Keep out of the reach of children. Store between 4 and 25 degrees C (39 and 77 degrees F). Do not freeze. Throw away any unused medicine after the expiration date. Discard packet after applying to affected area. Partial packets should not be saved or reused. NOTE: This sheet is a summary. It may not cover all possible information. If you have questions about this medicine, talk to your doctor, pharmacist, or health care provider.  2018 Elsevier/Gold Standard (2008-06-27 10:33:25) Genital Warts Genital warts are a common STD (sexually transmitted disease). They may appear as small bumps on the tissues of the genital area or anal area. Sometimes, they can become irritated and cause pain. Genital warts are easily passed to other people through sexual contact. Getting treatment is important because  genital warts can lead to other problems. In females, the virus that causes genital warts may increase the risk of cervical cancer. What are the causes? Genital warts are caused by a virus that is called human papillomavirus (HPV). HPV is spread by having unprotected sex with an infected person. It can be spread through vaginal, anal, and oral sex. Many people do not know that they are infected. They may be infected for years without problems. However, even if they do not have problems, they can pass the infection to their sexual partners. What increases the risk? Genital warts are more likely to develop in:  People who have unprotected sex.  People who have multiple sexual partners.  People who become sexually active before they are 33 years of age.  Men who are not circumcised.  Women who have a female sexual partner who is not circumcised.  People who have a weakened body defense system (immune system) due to disease or medicine.  People who smoke.  What are the signs or symptoms? Symptoms of genital warts include:  Small growths in the genital area or anal area. These warts often grow in clusters.  Itching and irritation in the genital area or anal area.  Bleeding from the warts.  Painful sexual intercourse.  How is this diagnosed? Genital warts can usually be diagnosed from their appearance on the vagina, vulva, penis, perineum, anus, or rectum. Tests may also be done, such as:  Biopsy. A tissue sample is removed so it can be looked at under a microscope.  Colposcopy. In females, a magnifying tool is used to examine the vagina and cervix. Certain solutions may be used to make the HPV cells change color so they can be seen more easily.  A Pap test in females.  Tests for other STDs.  How is this treated? Treatment for genital warts may include:  Applying prescription medicines to the warts. These may be solutions or creams.  Freezing the warts with liquid nitrogen  (cryotherapy).  Burning the warts with: ? Laser treatment. ? An electrified probe (electrocautery).  Injecting a substance (Candida antigen or Trichophyton antigen) into the warts to help the body's immune system to fight off the warts.  Interferon injections.  Surgery to remove the warts.  Follow these instructions at home: Medicines  Apply over-the-counter and prescription medicines only as told by your health care provider.  Do not treat genital warts with medicines that are used for treating hand warts.  Talk with your health care provider about using over-the-counter anti-itch creams. General instructions  Do not touch or scratch the warts.  Do not have sex until your treatment has been completed.  Tell your current and past sexual partners about your condition because they may also need treatment.  Keep all follow-up visits as told by your health care provider. This is important.  After treatment, use condoms during sex to prevent future infections. Other Instructions for Women  Women who have genital warts might need increased screening for cervical cancer. This type of cancer is slow growing and can be cured if it is found early. Chances of developing cervical cancer are increased with HPV.  If you become pregnant, tell your health care provider that you have had HPV. Your health care provider will monitor you closely during pregnancy to be sure that your baby is safe. How is this prevented? Talk with your health care provider about getting the HPV vaccines. These vaccines prevent some HPV infections and cancers. It is recommended that the vaccine be given to males and females who are 45-14 years of age. It will not work if you already have HPV, and it is not recommended for pregnant women. Contact a health care provider if:  You have redness, swelling, or pain in the area of the treated skin.  You have a fever.  You feel generally ill.  You feel lumps in and  around your genital area or anal area.  You have bleeding in your genital area or anal area.  You have pain during sexual intercourse. This information is not intended to replace advice given to you by your health care provider. Make sure you discuss any questions you have with your health care provider. Document Released: 07/11/2000 Document Revised: 12/20/2015 Document Reviewed: 10/09/2014 Elsevier Interactive Patient Education  [employer].

## 2017-02-02 ENCOUNTER — Ambulatory Visit (INDEPENDENT_AMBULATORY_CARE_PROVIDER_SITE_OTHER): Payer: PRIVATE HEALTH INSURANCE | Admitting: Obstetrics & Gynecology

## 2017-02-02 VITALS — BP 134/72 | HR 81 | Wt 310.0 lb

## 2017-02-02 DIAGNOSIS — O34219 Maternal care for unspecified type scar from previous cesarean delivery: Secondary | ICD-10-CM

## 2017-02-02 DIAGNOSIS — O09892 Supervision of other high risk pregnancies, second trimester: Secondary | ICD-10-CM

## 2017-02-02 DIAGNOSIS — O99212 Obesity complicating pregnancy, second trimester: Secondary | ICD-10-CM

## 2017-02-02 DIAGNOSIS — O10912 Unspecified pre-existing hypertension complicating pregnancy, second trimester: Secondary | ICD-10-CM

## 2017-02-02 DIAGNOSIS — O3440 Maternal care for other abnormalities of cervix, unspecified trimester: Secondary | ICD-10-CM

## 2017-02-02 DIAGNOSIS — Z3482 Encounter for supervision of other normal pregnancy, second trimester: Secondary | ICD-10-CM

## 2017-02-02 DIAGNOSIS — Z3480 Encounter for supervision of other normal pregnancy, unspecified trimester: Secondary | ICD-10-CM

## 2017-02-02 DIAGNOSIS — Z6791 Unspecified blood type, Rh negative: Secondary | ICD-10-CM

## 2017-02-02 DIAGNOSIS — O99210 Obesity complicating pregnancy, unspecified trimester: Secondary | ICD-10-CM

## 2017-02-02 DIAGNOSIS — O3442 Maternal care for other abnormalities of cervix, second trimester: Secondary | ICD-10-CM

## 2017-02-02 DIAGNOSIS — O10919 Unspecified pre-existing hypertension complicating pregnancy, unspecified trimester: Secondary | ICD-10-CM

## 2017-02-02 DIAGNOSIS — O26899 Other specified pregnancy related conditions, unspecified trimester: Secondary | ICD-10-CM

## 2017-02-02 DIAGNOSIS — Z87898 Personal history of other specified conditions: Secondary | ICD-10-CM

## 2017-02-02 NOTE — Progress Notes (Signed)
   PRENATAL VISIT NOTE  Subjective:  Courtney Mooney is a 31 y.o. G3P1011 at [redacted]w[redacted]d being seen today for ongoing prenatal care.  She is currently monitored for the following issues for this high-risk pregnancy and has Supervision of other normal pregnancy, antepartum; Previous cesarean delivery, antepartum; Cervical abnormality affecting pregnancy, antepartum; Chronic hypertension during pregnancy, antepartum; Maternal obesity, antepartum; and Rh negative state in antepartum period on her problem list.  Patient reports no complaints.  Contractions: Not present. Vag. Bleeding: None.  Movement: Absent. Denies leaking of fluid.   The following portions of the patient's history were reviewed and updated as appropriate: allergies, current medications, past family history, past medical history, past social history, past surgical history and problem list. Problem list updated.  Objective:   Vitals:   02/02/17 0859  BP: 134/72  Pulse: 81  Weight: (!) 310 lb (140.6 kg)    Fetal Status:     Movement: Absent     General:  Alert, oriented and cooperative. Patient is in no acute distress.  Skin: Skin is warm and dry. No rash noted.   Cardiovascular: Normal heart rate noted  Respiratory: Normal respiratory effort, no problems with respiration noted  Abdomen: Soft, gravid, appropriate for gestational age. Pain/Pressure: Absent     Pelvic:  Cervical exam deferred        Extremities: Normal range of motion.  Edema: None  Mental Status: Normal mood and affect. Normal behavior. Normal judgment and thought content.   Assessment and Plan:  Pregnancy: G3P1011 at [redacted]w[redacted]d  1. Supervision of other normal pregnancy, antepartum Quad screen Scheduled anatomy scan   2. Rh negative state in antepartum period Needs Rhogam at 28 weeks  3. Previous cesarean delivery, antepartum  4. Maternal obesity, antepartum Pt reports today that in 2016 she was dx'd with prediabetes. She lost some weight and it was not  elevated on recheck. She also has a FH of DM  1 hour GTT today with HgbA1c   5. Chronic hypertension during pregnancy, antepartum BP stable daily babyASA  6. Cervical abnormality affecting pregnancy, antepartum Genital warts on Aldara  Preterm labor symptoms and general obstetric precautions including but not limited to vaginal bleeding, contractions, leaking of fluid and fetal movement were reviewed in detail with the patient. Please refer to After Visit Summary for other counseling recommendations.  F/u in 4 weeks or sooner prn  Lavonia Drafts, MD

## 2017-02-02 NOTE — Patient Instructions (Signed)

## 2017-02-03 LAB — HEMOGLOBIN A1C
Est. average glucose Bld gHb Est-mCnc: 120 mg/dL
Hgb A1c MFr Bld: 5.8 % — ABNORMAL HIGH (ref 4.8–5.6)

## 2017-02-05 LAB — AFP TETRA
DIA Mom Value: 0.98
DIA VALUE (EIA): 110.67 pg/mL
DSR (By Age)    1 IN: 420
DSR (Second Trimester) 1 IN: 5654
GESTATIONAL AGE AFP: 16.6 wk
MSAFP Mom: 1.89
MSAFP: 49.1 ng/mL
MSHCG MOM: 0.81
MSHCG: 17702 m[IU]/mL
Maternal Age At EDD: 33.3 yr
Osb Risk: 2068
Test Results:: NEGATIVE
UE3 MOM: 1
Weight: 310 [lb_av]
uE3 Value: 0.76 ng/mL

## 2017-02-05 LAB — GLUCOSE TOLERANCE, 1 HOUR: Glucose, 1Hr PP: 123 mg/dL (ref 65–199)

## 2017-02-16 ENCOUNTER — Telehealth: Payer: Self-pay

## 2017-02-16 DIAGNOSIS — R7309 Other abnormal glucose: Secondary | ICD-10-CM

## 2017-02-16 NOTE — Telephone Encounter (Signed)
-----   Message from Lavonia Drafts, MD sent at 02/15/2017  7:14 PM EDT ----- Please call pt. She is still PRE diabetic but, her 1 hour GTT is WNL. Please refer her to the nutritionist so we can prevent GDM.  Thx, clh-S

## 2017-02-16 NOTE — Telephone Encounter (Signed)
Patient called and made aware that her glucose one hour test was normal but that her HGBA1C was elevated. Patient made aware that we are placing referral for nutrition education. Kathrene Alu RNBSN

## 2017-02-25 ENCOUNTER — Encounter (HOSPITAL_COMMUNITY): Payer: Self-pay

## 2017-02-25 ENCOUNTER — Encounter: Payer: Self-pay | Admitting: Skilled Nursing Facility1

## 2017-02-25 ENCOUNTER — Ambulatory Visit (HOSPITAL_COMMUNITY)
Admission: RE | Admit: 2017-02-25 | Discharge: 2017-02-25 | Disposition: A | Discharge status: Home / Self Care | Payer: PRIVATE HEALTH INSURANCE | Source: Ambulatory Visit | Attending: Obstetrics & Gynecology | Admitting: Obstetrics & Gynecology

## 2017-02-25 ENCOUNTER — Encounter: Payer: PRIVATE HEALTH INSURANCE | Attending: Obstetrics & Gynecology | Admitting: Skilled Nursing Facility1

## 2017-02-25 ENCOUNTER — Other Ambulatory Visit: Payer: Self-pay | Admitting: Obstetrics & Gynecology

## 2017-02-25 ENCOUNTER — Other Ambulatory Visit (HOSPITAL_COMMUNITY): Payer: Self-pay | Admitting: *Deleted

## 2017-02-25 DIAGNOSIS — O99210 Obesity complicating pregnancy, unspecified trimester: Secondary | ICD-10-CM

## 2017-02-25 DIAGNOSIS — Z3689 Encounter for other specified antenatal screening: Secondary | ICD-10-CM

## 2017-02-25 DIAGNOSIS — O34219 Maternal care for unspecified type scar from previous cesarean delivery: Secondary | ICD-10-CM | POA: Diagnosis not present

## 2017-02-25 DIAGNOSIS — O99212 Obesity complicating pregnancy, second trimester: Secondary | ICD-10-CM | POA: Insufficient documentation

## 2017-02-25 DIAGNOSIS — O10919 Unspecified pre-existing hypertension complicating pregnancy, unspecified trimester: Secondary | ICD-10-CM

## 2017-02-25 DIAGNOSIS — Z6791 Unspecified blood type, Rh negative: Secondary | ICD-10-CM

## 2017-02-25 DIAGNOSIS — O10019 Pre-existing essential hypertension complicating pregnancy, unspecified trimester: Secondary | ICD-10-CM | POA: Diagnosis not present

## 2017-02-25 DIAGNOSIS — R7309 Other abnormal glucose: Secondary | ICD-10-CM | POA: Diagnosis present

## 2017-02-25 DIAGNOSIS — Z3480 Encounter for supervision of other normal pregnancy, unspecified trimester: Secondary | ICD-10-CM | POA: Insufficient documentation

## 2017-02-25 DIAGNOSIS — O3440 Maternal care for other abnormalities of cervix, unspecified trimester: Secondary | ICD-10-CM

## 2017-02-25 DIAGNOSIS — Z6841 Body Mass Index (BMI) 40.0 and over, adult: Secondary | ICD-10-CM | POA: Insufficient documentation

## 2017-02-25 DIAGNOSIS — O26899 Other specified pregnancy related conditions, unspecified trimester: Secondary | ICD-10-CM

## 2017-02-25 DIAGNOSIS — Z3A20 20 weeks gestation of pregnancy: Secondary | ICD-10-CM

## 2017-02-25 DIAGNOSIS — Z87898 Personal history of other specified conditions: Secondary | ICD-10-CM

## 2017-02-25 NOTE — Progress Notes (Signed)
  Medical Nutrition Therapy:  Appt start time: 3:05 end time:  3:55   Assessment:  Primary concerns today: hypertension in pregnancy. Pt states she is prediabetic and pregnant. Pt states she is [redacted] weeks along this being her second pregnancy. Pt reports her weight being 313 pounds. Pts A1C 5.8. Pt states her husband is a vegetarian.   MEDICATIONS: See List   DIETARY INTAKE:  Usual eating pattern includes 3 meals and 2 snacks per day.  Everyday foods include popcicle.  Avoided foods include none identified.    24-hr recall:  B ( AM): banana or yogurt or biscuit out Snk ( AM):  L ( PM): fast food Snk ( PM): popcicle and pickle  D ( PM): baked pork chop with rice and broccoli/cauliflower  Snk ( PM): popcicle and pickle  Beverages: water, koolaid, juice   Usual physical activity: ADL's  Estimated energy needs: 2000 calories 225 g carbohydrates 150 g protein 156 g fat  Progress Towards Goal(s):  In progress.   Nutritional Diagnosis:  NB-1.1 Food and nutrition-related knowledge deficit As related to newly diagnosed pre-diabetes in pregnancy .  As evidenced by referral.    Intervention:  Nutrition counseling. Dietitian educated the pt on appropriate eating to control pre-diabetes and offer nutrition for her baby.  Goals: -Try not to eat fast food more than 2 times a week Motivators:   To be healthy for belly baby and born baby  To be healthy:   Being able to walk without getting winded   Feeling full of energy    Avoid gaining excessive weight Barriers:   Not being prepared/preplanning Logistics:   Meal prep: cook the night before and have that for lunch  Faster meals for lunch: Like:    Salad: grilled chicken, lettuce/spinach, cucumbers, tomatoes, mandarin oranges, sunflower seeds    Wheat bread with [place] or ham and cheese and lettuce and tomato and pickle on the side fruit or vegetable and a small handful of chips  -Try to stick with one Popcicle a day -Choose the  whole wheat pasta and brown rice  -Get to steppin   Teaching Method Utilized: Visual Auditory Hands on  Handouts given during visit include:  MyPlate  Barriers to learning/adherence to lifestyle change: none identified   Demonstrated degree of understanding via:  Teach Back   Monitoring/Evaluation:  Dietary intake, exercise, and body weight prn.

## 2017-02-25 NOTE — Patient Instructions (Signed)
-  Try not to eat fast food more than 2 times a week  Motivators:   To be healthy for belly baby and born baby  To be healthy:   Being able to walk without getting winded   Feeling full of energy    Avoid gaining excessive weight  Barriers:   Not being prepared/preplanning    Logistics:   Meal prep: cook the night before and have that for lunch  Faster meals for lunch: Like:    Salad: grilled chicken, lettuce/spinach, cucumbers, tomatoes, mandarin oranges, sunflower seeds    Wheat bread with [place] or ham and cheese and lettuce and tomato and pickle on the side fruit or vegetable and a small handful of chips   -Try to stick with one Popcicle a day  -Choose the whole wheat pasta and brown rice   -Get to steppin

## 2017-03-05 ENCOUNTER — Ambulatory Visit (INDEPENDENT_AMBULATORY_CARE_PROVIDER_SITE_OTHER): Payer: PRIVATE HEALTH INSURANCE | Admitting: Family Medicine

## 2017-03-05 VITALS — BP 119/79 | HR 86 | Wt 309.0 lb

## 2017-03-05 DIAGNOSIS — O26899 Other specified pregnancy related conditions, unspecified trimester: Secondary | ICD-10-CM

## 2017-03-05 DIAGNOSIS — O09892 Supervision of other high risk pregnancies, second trimester: Secondary | ICD-10-CM

## 2017-03-05 DIAGNOSIS — O10919 Unspecified pre-existing hypertension complicating pregnancy, unspecified trimester: Secondary | ICD-10-CM

## 2017-03-05 DIAGNOSIS — O10912 Unspecified pre-existing hypertension complicating pregnancy, second trimester: Secondary | ICD-10-CM

## 2017-03-05 DIAGNOSIS — Z6791 Unspecified blood type, Rh negative: Secondary | ICD-10-CM

## 2017-03-05 DIAGNOSIS — Z029 Encounter for administrative examinations, unspecified: Secondary | ICD-10-CM

## 2017-03-05 DIAGNOSIS — E669 Obesity, unspecified: Secondary | ICD-10-CM

## 2017-03-05 DIAGNOSIS — O99212 Obesity complicating pregnancy, second trimester: Secondary | ICD-10-CM

## 2017-03-05 DIAGNOSIS — Z3480 Encounter for supervision of other normal pregnancy, unspecified trimester: Secondary | ICD-10-CM

## 2017-03-05 DIAGNOSIS — O34219 Maternal care for unspecified type scar from previous cesarean delivery: Secondary | ICD-10-CM

## 2017-03-05 DIAGNOSIS — O99210 Obesity complicating pregnancy, unspecified trimester: Secondary | ICD-10-CM

## 2017-03-05 NOTE — Progress Notes (Signed)
   PRENATAL VISIT NOTE  Subjective:  Courtney Mooney is a 31 y.o. G3P1011 at [redacted]w[redacted]d being seen today for ongoing prenatal care.  She is currently monitored for the following issues for this high-risk pregnancy and has Supervision of other normal pregnancy, antepartum; Previous cesarean delivery, antepartum; Cervical abnormality affecting pregnancy, antepartum; Chronic hypertension during pregnancy, antepartum; Maternal obesity, antepartum; Rh negative state in antepartum period; and History of prediabetes on her problem list.  Patient reports no complaints.  Contractions: Not present. Vag. Bleeding: None.  Movement: Present. Denies leaking of fluid.   The following portions of the patient's history were reviewed and updated as appropriate: allergies, current medications, past family history, past medical history, past social history, past surgical history and problem list. Problem list updated.  Objective:   Vitals:   03/05/17 0903  BP: 119/79  Pulse: 86  Weight: (!) 309 lb (140.2 kg)    Fetal Status: Fetal Heart Rate (bpm): 145   Movement: Present     General:  Alert, oriented and cooperative. Patient is in no acute distress.  Skin: Skin is warm and dry. No rash noted.   Cardiovascular: Normal heart rate noted  Respiratory: Normal respiratory effort, no problems with respiration noted  Abdomen: Soft, gravid, appropriate for gestational age.  Pain/Pressure: Present     Pelvic: Cervical exam deferred        Extremities: Normal range of motion.  Edema: None  Mental Status:  Normal mood and affect. Normal behavior. Normal judgment and thought content.   Assessment and Plan:  Pregnancy: G3P1011 at [redacted]w[redacted]d  1. Supervision of other normal pregnancy, antepartum FHT normal. Incomplete anatomy scan - [place] scheduled of 8/29.  2. Chronic hypertension during pregnancy, antepartum BP controlled. Discussed antenatal testing. Growth [place] every 4 weeks. Continue ASA  3. Previous cesarean delivery,  antepartum [facility]. Prior cesarean for HSV outbreak.  4. Rh negative state in antepartum period Rhogam  5. Maternal obesity, antepartum  Preterm labor symptoms and general obstetric precautions including but not limited to vaginal bleeding, contractions, leaking of fluid and fetal movement were reviewed in detail with the patient. Please refer to After Visit Summary for other counseling recommendations.  No Follow-up on file.   Truett Mainland, DO

## 2017-03-25 ENCOUNTER — Ambulatory Visit (HOSPITAL_COMMUNITY)
Admission: RE | Admit: 2017-03-25 | Discharge: 2017-03-25 | Disposition: A | Discharge status: Home / Self Care | Payer: PRIVATE HEALTH INSURANCE | Source: Ambulatory Visit | Attending: Obstetrics & Gynecology | Admitting: Obstetrics & Gynecology

## 2017-03-25 ENCOUNTER — Other Ambulatory Visit (HOSPITAL_COMMUNITY): Payer: Self-pay | Admitting: Maternal and Fetal Medicine

## 2017-03-25 DIAGNOSIS — O34219 Maternal care for unspecified type scar from previous cesarean delivery: Secondary | ICD-10-CM

## 2017-03-25 DIAGNOSIS — O10919 Unspecified pre-existing hypertension complicating pregnancy, unspecified trimester: Secondary | ICD-10-CM

## 2017-03-25 DIAGNOSIS — O10912 Unspecified pre-existing hypertension complicating pregnancy, second trimester: Secondary | ICD-10-CM | POA: Diagnosis present

## 2017-03-25 DIAGNOSIS — Z3A24 24 weeks gestation of pregnancy: Secondary | ICD-10-CM | POA: Diagnosis not present

## 2017-03-25 DIAGNOSIS — O99212 Obesity complicating pregnancy, second trimester: Secondary | ICD-10-CM | POA: Insufficient documentation

## 2017-03-25 DIAGNOSIS — O10012 Pre-existing essential hypertension complicating pregnancy, second trimester: Secondary | ICD-10-CM | POA: Insufficient documentation

## 2017-03-25 DIAGNOSIS — Z362 Encounter for other antenatal screening follow-up: Secondary | ICD-10-CM | POA: Diagnosis not present

## 2017-04-06 ENCOUNTER — Encounter: Payer: PRIVATE HEALTH INSURANCE | Admitting: Obstetrics & Gynecology

## 2017-04-06 ENCOUNTER — Ambulatory Visit (INDEPENDENT_AMBULATORY_CARE_PROVIDER_SITE_OTHER): Payer: PRIVATE HEALTH INSURANCE | Admitting: Obstetrics & Gynecology

## 2017-04-06 VITALS — BP 127/74 | HR 87 | Wt 313.0 lb

## 2017-04-06 DIAGNOSIS — O99212 Obesity complicating pregnancy, second trimester: Secondary | ICD-10-CM

## 2017-04-06 DIAGNOSIS — Z6791 Unspecified blood type, Rh negative: Secondary | ICD-10-CM

## 2017-04-06 DIAGNOSIS — Z87898 Personal history of other specified conditions: Secondary | ICD-10-CM

## 2017-04-06 DIAGNOSIS — O34219 Maternal care for unspecified type scar from previous cesarean delivery: Secondary | ICD-10-CM

## 2017-04-06 DIAGNOSIS — Z3490 Encounter for supervision of normal pregnancy, unspecified, unspecified trimester: Secondary | ICD-10-CM

## 2017-04-06 DIAGNOSIS — O98512 Other viral diseases complicating pregnancy, second trimester: Secondary | ICD-10-CM

## 2017-04-06 DIAGNOSIS — B009 Herpesviral infection, unspecified: Secondary | ICD-10-CM

## 2017-04-06 DIAGNOSIS — O99210 Obesity complicating pregnancy, unspecified trimester: Secondary | ICD-10-CM

## 2017-04-06 DIAGNOSIS — O10912 Unspecified pre-existing hypertension complicating pregnancy, second trimester: Secondary | ICD-10-CM

## 2017-04-06 DIAGNOSIS — O3440 Maternal care for other abnormalities of cervix, unspecified trimester: Secondary | ICD-10-CM

## 2017-04-06 DIAGNOSIS — Z3480 Encounter for supervision of other normal pregnancy, unspecified trimester: Secondary | ICD-10-CM

## 2017-04-06 DIAGNOSIS — O26899 Other specified pregnancy related conditions, unspecified trimester: Secondary | ICD-10-CM

## 2017-04-06 DIAGNOSIS — O3442 Maternal care for other abnormalities of cervix, second trimester: Secondary | ICD-10-CM

## 2017-04-06 DIAGNOSIS — O10919 Unspecified pre-existing hypertension complicating pregnancy, unspecified trimester: Secondary | ICD-10-CM

## 2017-04-06 DIAGNOSIS — O09892 Supervision of other high risk pregnancies, second trimester: Secondary | ICD-10-CM

## 2017-04-06 DIAGNOSIS — O98519 Other viral diseases complicating pregnancy, unspecified trimester: Secondary | ICD-10-CM

## 2017-04-06 NOTE — Progress Notes (Signed)
   PRENATAL VISIT NOTE  Subjective:  Courtney Mooney is a 31 y.o. G3P1011 at [redacted]w[redacted]d being seen today for ongoing prenatal care.  She is currently monitored for the following issues for this high-risk pregnancy and has Supervision of other normal pregnancy, antepartum; Previous cesarean delivery, antepartum; Cervical abnormality affecting pregnancy, antepartum; Chronic hypertension during pregnancy, antepartum; Maternal obesity, antepartum; Rh negative state in antepartum period; and History of prediabetes on her problem list.  Patient reports no complaints.  Contractions: Not present. Vag. Bleeding: None.  Movement: Present. Denies leaking of fluid.   The following portions of the patient's history were reviewed and updated as appropriate: allergies, current medications, past family history, past medical history, past social history, past surgical history and problem list. Problem list updated.  Objective:   Vitals:   04/06/17 0848  BP: 127/74  Pulse: 87  Weight: (!) 313 lb (142 kg)    Fetal Status:     Movement: Present     General:  Alert, oriented and cooperative. Patient is in no acute distress.  Skin: Skin is warm and dry. No rash noted.   Cardiovascular: Normal heart rate noted  Respiratory: Normal respiratory effort, no problems with respiration noted  Abdomen: Soft, gravid, appropriate for gestational age.  Pain/Pressure: Present     Pelvic: Cervical exam deferred        Extremities: Normal range of motion.  Edema: None  Mental Status:  Normal mood and affect. Normal behavior. Normal judgment and thought content.   Assessment and Plan:  Pregnancy: G3P1011 at [redacted]w[redacted]d  1. Prenatal care, antepartum  - Glucose Tolerance, 2 Hours w/1 Hour - HIV antibody (with reflex) - RPR - CBC  2. Supervision of other normal pregnancy, antepartum  3. Rh negative state in antepartum period Rhogam at next visit  4. Previous cesarean delivery, antepartum VBAC consent signed today- pev  c/s dueo [place] outbreak  5. Maternal obesity, antepartum 03/25/2017  Est. FW:     638  gm      1 lb 7 oz     46  %  6. History of prediabetes 2 hour GTT today  7. Chronic hypertension during pregnancy, antepartum BO's WNL Begin antenatal testing at 30-32 weeks  8. Cervical abnormality affecting pregnancy, antepartum  9. H/o HSV Suppression at 36 weeks  Preterm labor symptoms and general obstetric precautions including but not limited to vaginal bleeding, contractions, leaking of fluid and fetal movement were reviewed in detail with the patient. Please refer to After Visit Summary for other counseling recommendations.  Return in about 4 weeks (around 05/04/2017).   Lavonia Drafts, MD

## 2017-04-07 LAB — CBC
HEMATOCRIT: 33.9 % — AB (ref 34.0–46.6)
HEMOGLOBIN: 10.9 g/dL — AB (ref 11.1–15.9)
MCH: 27.1 pg (ref 26.6–33.0)
MCHC: 32.2 g/dL (ref 31.5–35.7)
MCV: 84 fL (ref 79–97)
Platelets: 352 10*3/uL (ref 150–379)
RBC: 4.02 x10E6/uL (ref 3.77–5.28)
RDW: 14 % (ref 12.3–15.4)
WBC: 13.6 10*3/uL — AB (ref 3.4–10.8)

## 2017-04-07 LAB — GLUCOSE TOLERANCE, 2 HOURS W/ 1HR
GLUCOSE, 2 HOUR: 91 mg/dL (ref 65–152)
Glucose, 1 hour: 88 mg/dL (ref 65–179)
Glucose, Fasting: 79 mg/dL (ref 65–91)

## 2017-04-07 LAB — HIV ANTIBODY (ROUTINE TESTING W REFLEX): HIV Screen 4th Generation wRfx: NONREACTIVE

## 2017-04-07 LAB — RPR: RPR Ser Ql: NONREACTIVE

## 2017-04-08 ENCOUNTER — Ambulatory Visit: Payer: PRIVATE HEALTH INSURANCE | Admitting: Skilled Nursing Facility1

## 2017-04-20 ENCOUNTER — Ambulatory Visit (INDEPENDENT_AMBULATORY_CARE_PROVIDER_SITE_OTHER): Payer: PRIVATE HEALTH INSURANCE | Admitting: Obstetrics & Gynecology

## 2017-04-20 VITALS — BP 121/76 | HR 88 | Wt 312.0 lb

## 2017-04-20 DIAGNOSIS — Z3480 Encounter for supervision of other normal pregnancy, unspecified trimester: Secondary | ICD-10-CM

## 2017-04-20 DIAGNOSIS — O34219 Maternal care for unspecified type scar from previous cesarean delivery: Secondary | ICD-10-CM

## 2017-04-20 DIAGNOSIS — Z23 Encounter for immunization: Secondary | ICD-10-CM

## 2017-04-20 DIAGNOSIS — O3440 Maternal care for other abnormalities of cervix, unspecified trimester: Secondary | ICD-10-CM

## 2017-04-20 DIAGNOSIS — O26899 Other specified pregnancy related conditions, unspecified trimester: Secondary | ICD-10-CM

## 2017-04-20 DIAGNOSIS — Z3490 Encounter for supervision of normal pregnancy, unspecified, unspecified trimester: Secondary | ICD-10-CM

## 2017-04-20 DIAGNOSIS — O3442 Maternal care for other abnormalities of cervix, second trimester: Secondary | ICD-10-CM

## 2017-04-20 DIAGNOSIS — O99210 Obesity complicating pregnancy, unspecified trimester: Secondary | ICD-10-CM

## 2017-04-20 DIAGNOSIS — B009 Herpesviral infection, unspecified: Secondary | ICD-10-CM

## 2017-04-20 DIAGNOSIS — O98519 Other viral diseases complicating pregnancy, unspecified trimester: Secondary | ICD-10-CM

## 2017-04-20 DIAGNOSIS — O99212 Obesity complicating pregnancy, second trimester: Secondary | ICD-10-CM

## 2017-04-20 DIAGNOSIS — O09892 Supervision of other high risk pregnancies, second trimester: Secondary | ICD-10-CM

## 2017-04-20 DIAGNOSIS — Z87898 Personal history of other specified conditions: Secondary | ICD-10-CM

## 2017-04-20 DIAGNOSIS — Z6791 Unspecified blood type, Rh negative: Secondary | ICD-10-CM

## 2017-04-20 DIAGNOSIS — O10919 Unspecified pre-existing hypertension complicating pregnancy, unspecified trimester: Secondary | ICD-10-CM

## 2017-04-20 DIAGNOSIS — O10912 Unspecified pre-existing hypertension complicating pregnancy, second trimester: Secondary | ICD-10-CM

## 2017-04-20 DIAGNOSIS — O98512 Other viral diseases complicating pregnancy, second trimester: Secondary | ICD-10-CM

## 2017-04-20 MED ORDER — RHO D IMMUNE GLOBULIN 1500 UNITS IM SOSY: 1500.0000 [IU] | PREFILLED_SYRINGE | Freq: Once | INTRAMUSCULAR | Status: DC

## 2017-04-20 NOTE — Patient Instructions (Signed)
Third Trimester of Pregnancy The third trimester is from week 28 through week 40 (months 7 through 9). The third trimester is a time when the unborn baby (fetus) is growing rapidly. At the end of the ninth month, the fetus is about 20 inches in length and weighs 6-10 pounds. Body changes during your third trimester Your body will continue to go through many changes during pregnancy. The changes vary from woman to woman. During the third trimester:  Your weight will continue to increase. You can expect to gain 25-35 pounds (11-16 kg) by the end of the pregnancy.  You may begin to get stretch marks on your hips, abdomen, and breasts.  You may urinate more often because the fetus is moving lower into your pelvis and pressing on your bladder.  You may develop or continue to have heartburn. This is caused by increased hormones that slow down muscles in the digestive tract.  You may develop or continue to have constipation because increased hormones slow digestion and cause the muscles that push waste through your intestines to relax.  You may develop hemorrhoids. These are swollen veins (varicose veins) in the rectum that can itch or be painful.  You may develop swollen, bulging veins (varicose veins) in your legs.  You may have increased body aches in the pelvis, back, or thighs. This is due to weight gain and increased hormones that are relaxing your joints.  You may have changes in your hair. These can include thickening of your hair, rapid growth, and changes in texture. Some women also have hair loss during or after pregnancy, or hair that feels dry or thin. Your hair will most likely return to normal after your baby is born.  Your breasts will continue to grow and they will continue to become tender. A yellow fluid (colostrum) may leak from your breasts. This is the first milk you are producing for your baby.  Your belly button may stick out.  You may notice more swelling in your hands,  face, or ankles.  You may have increased tingling or numbness in your hands, arms, and legs. The skin on your belly may also feel numb.  You may feel short of breath because of your expanding uterus.  You may have more problems sleeping. This can be caused by the size of your belly, increased need to urinate, and an increase in your body's metabolism.  You may notice the fetus "dropping," or moving lower in your abdomen (lightening).  You may have increased vaginal discharge.  You may notice your joints feel loose and you may have pain around your pelvic bone.  What to expect at prenatal visits You will have prenatal exams every 2 weeks until week 36. Then you will have weekly prenatal exams. During a routine prenatal visit:  You will be weighed to make sure you and the baby are growing normally.  Your blood pressure will be taken.  Your abdomen will be measured to track your baby's growth.  The fetal heartbeat will be listened to.  Any test results from the previous visit will be discussed.  You may have a cervical check near your due date to see if your cervix has softened or thinned (effaced).  You will be tested for Group B streptococcus. This happens between 35 and 37 weeks.  Your health care provider may ask you:  What your birth plan is.  How you are feeling.  If you are feeling the baby move.  If you have had   any abnormal symptoms, such as leaking fluid, bleeding, severe headaches, or abdominal cramping.  If you are using any tobacco products, including cigarettes, chewing tobacco, and electronic cigarettes.  If you have any questions.  Other tests or screenings that may be performed during your third trimester include:  Blood tests that check for low iron levels (anemia).  Fetal testing to check the health, activity level, and growth of the fetus. Testing is done if you have certain medical conditions or if there are problems during the  pregnancy.  Nonstress test (NST). This test checks the health of your baby to make sure there are no signs of problems, such as the baby not getting enough oxygen. During this test, a belt is placed around your belly. The baby is made to move, and its heart rate is monitored during movement.  What is false labor? False labor is a condition in which you feel small, irregular tightenings of the muscles in the womb (contractions) that usually go away with rest, changing position, or drinking water. These are called Braxton Hicks contractions. Contractions may last for hours, days, or even weeks before true labor sets in. If contractions come at regular intervals, become more frequent, increase in intensity, or become painful, you should see your health care provider. What are the signs of labor?  Abdominal cramps.  Regular contractions that start at 10 minutes apart and become stronger and more frequent with time.  Contractions that start on the top of the uterus and spread down to the lower abdomen and back.  Increased pelvic pressure and dull back pain.  A watery or bloody mucus discharge that comes from the vagina.  Leaking of amniotic fluid. This is also known as your "water breaking." It could be a slow trickle or a gush. Let your health care provider know if it has a color or strange odor. If you have any of these signs, call your health care provider right away, even if it is before your due date. Follow these instructions at home: Medicines  Follow your health care provider's instructions regarding medicine use. Specific medicines may be either safe or unsafe to take during pregnancy.  Take a prenatal vitamin that contains at least 600 micrograms (mcg) of folic acid.  If you develop constipation, try taking a stool softener if your health care provider approves. Eating and drinking  Eat a balanced diet that includes fresh fruits and vegetables, whole grains, good sources of protein  such as meat, eggs, or tofu, and low-fat dairy. Your health care provider will help you determine the amount of weight gain that is right for you.  Avoid raw meat and uncooked cheese. These carry germs that can cause birth defects in the baby.  If you have low calcium intake from food, talk to your health care provider about whether you should take a daily calcium supplement.  Eat four or five small meals rather than three large meals a day.  Limit foods that are high in fat and processed sugars, such as fried and sweet foods.  To prevent constipation: ? Drink enough fluid to keep your urine clear or pale yellow. ? Eat foods that are high in fiber, such as fresh fruits and vegetables, whole grains, and beans. Activity  Exercise only as directed by your health care provider. Most women can continue their usual exercise routine during pregnancy. Try to exercise for 30 minutes at least 5 days a week. Stop exercising if you experience uterine contractions.  Avoid heavy   lifting.  Do not exercise in extreme heat or humidity, or at high altitudes.  Wear low-heel, comfortable shoes.  Practice good posture.  You may continue to have sex unless your health care provider tells you otherwise. Relieving pain and discomfort  Take frequent breaks and rest with your legs elevated if you have leg cramps or low back pain.  Take warm sitz baths to soothe any pain or discomfort caused by hemorrhoids. Use hemorrhoid cream if your health care provider approves.  Wear a good support bra to prevent discomfort from breast tenderness.  If you develop varicose veins: ? Wear support pantyhose or compression stockings as told by your healthcare provider. ? Elevate your feet for 15 minutes, 3-4 times a day. Prenatal care  Write down your questions. Take them to your prenatal visits.  Keep all your prenatal visits as told by your health care provider. This is important. Safety  Wear your seat belt at  all times when driving.  Make a list of emergency phone numbers, including numbers for family, friends, the hospital, and police and fire departments. General instructions  Avoid cat litter boxes and soil used by cats. These carry germs that can cause birth defects in the baby. If you have a cat, ask someone to clean the litter box for you.  Do not travel far distances unless it is absolutely necessary and only with the approval of your health care provider.  Do not use hot tubs, steam rooms, or saunas.  Do not drink alcohol.  Do not use any products that contain nicotine or tobacco, such as cigarettes and e-cigarettes. If you need help quitting, ask your health care provider.  Do not use any medicinal herbs or unprescribed drugs. These chemicals affect the formation and growth of the baby.  Do not douche or use tampons or scented sanitary pads.  Do not cross your legs for long periods of time.  To prepare for the arrival of your baby: ? Take prenatal classes to understand, practice, and ask questions about labor and delivery. ? Make a trial run to the hospital. ? Visit the hospital and tour the maternity area. ? Arrange for maternity or paternity leave through employers. ? Arrange for family and friends to take care of pets while you are in the hospital. ? Purchase a rear-facing car seat and make sure you know how to install it in your car. ? Pack your hospital bag. ? Prepare the baby's nursery. Make sure to remove all pillows and stuffed animals from the baby's crib to prevent suffocation.  Visit your dentist if you have not gone during your pregnancy. Use a soft toothbrush to brush your teeth and be gentle when you floss. Contact a health care provider if:  You are unsure if you are in labor or if your water has broken.  You become dizzy.  You have mild pelvic cramps, pelvic pressure, or nagging pain in your abdominal area.  You have lower back pain.  You have persistent  nausea, vomiting, or diarrhea.  You have an unusual or bad smelling vaginal discharge.  You have pain when you urinate. Get help right away if:  Your water breaks before 37 weeks.  You have regular contractions less than 5 minutes apart before 37 weeks.  You have a fever.  You are leaking fluid from your vagina.  You have spotting or bleeding from your vagina.  You have severe abdominal pain or cramping.  You have rapid weight loss or weight gain.    You have shortness of breath with chest pain.  You notice sudden or extreme swelling of your face, hands, ankles, feet, or legs.  Your baby makes fewer than 10 movements in 2 hours.  You have severe headaches that do not go away when you take medicine.  You have vision changes. Summary  The third trimester is from week 28 through week 40, months 7 through 9. The third trimester is a time when the unborn baby (fetus) is growing rapidly.  During the third trimester, your discomfort may increase as you and your baby continue to gain weight. You may have abdominal, leg, and back pain, sleeping problems, and an increased need to urinate.  During the third trimester your breasts will keep growing and they will continue to become tender. A yellow fluid (colostrum) may leak from your breasts. This is the first milk you are producing for your baby.  False labor is a condition in which you feel small, irregular tightenings of the muscles in the womb (contractions) that eventually go away. These are called Braxton Hicks contractions. Contractions may last for hours, days, or even weeks before true labor sets in.  Signs of labor can include: abdominal cramps; regular contractions that start at 10 minutes apart and become stronger and more frequent with time; watery or bloody mucus discharge that comes from the vagina; increased pelvic pressure and dull back pain; and leaking of amniotic fluid. This information is not intended to replace advice  given to you by your health care provider. Make sure you discuss any questions you have with your health care provider. Document Released: 07/08/2001 Document Revised: 12/20/2015 Document Reviewed: 09/14/2012 Elsevier Interactive Patient Education  2017 [employer]  

## 2017-04-20 NOTE — Progress Notes (Signed)
   PRENATAL VISIT NOTE  Subjective:  Courtney Mooney is a 31 y.o. G3P1011 at [redacted]w[redacted]d being seen today for ongoing prenatal care.  She is currently monitored for the following issues for this high-risk pregnancy and has Supervision of other normal pregnancy, antepartum; Previous cesarean delivery, antepartum; Cervical abnormality affecting pregnancy, antepartum; Chronic hypertension during pregnancy, antepartum; Maternal obesity, antepartum; Rh negative state in antepartum period; History of prediabetes; and Herpes simplex type 2 (HSV-2) infection affecting pregnancy, antepartum on her problem list.  Patient reports no complaints.  Contractions: Not present. Vag. Bleeding: None.  Movement: Present. Denies leaking of fluid.   The following portions of the patient's history were reviewed and updated as appropriate: allergies, current medications, past family history, past medical history, past social history, past surgical history and problem list. Problem list updated.  Objective:   Vitals:   04/20/17 0849  BP: 121/76  Pulse: 88  Weight: (!) 312 lb (141.5 kg)    Fetal Status: Fetal Heart Rate (bpm): 141   Movement: Present     General:  Alert, oriented and cooperative. Patient is in no acute distress.  Skin: Skin is warm and dry. No rash noted.   Cardiovascular: Normal heart rate noted  Respiratory: Normal respiratory effort, no problems with respiration noted  Abdomen: Soft, gravid, appropriate for gestational age.  Pain/Pressure: Present     Pelvic: Cervical exam deferred        Extremities: Normal range of motion.  Edema: Trace  Mental Status:  Normal mood and affect. Normal behavior. Normal judgment and thought content.   Assessment and Plan:  Pregnancy: G3P1011 at [redacted]w[redacted]d  1. Encounter for supervision of normal pregnancy, antepartum, unspecified gravidity  - Tdap vaccine greater than or equal to 7yo IM  2. Supervision of other normal pregnancy, antepartum  3. Rh negative state  in antepartum period Rhogam given today   4. Previous cesarean delivery, antepartum TOLAC papers signed  5. Maternal obesity, antepartum S>D rec [place] for size due to obesity   6. History of prediabetes Pt with a normal 2 hour GTT rec random fasting glc- 3x/week and random 2 hour 3x/week  7. Herpes simplex type 2 (HSV-2) infection affecting pregnancy, antepartum Begin antiviral @ 36 weeks   8. Chronic hypertension during pregnancy, antepartum On baby ASA Begin antenatal testing biweekly.   9. Cervical abnormality affecting pregnancy, antepartum  Preterm labor symptoms and general obstetric precautions including but not limited to vaginal bleeding, contractions, leaking of fluid and fetal movement were reviewed in detail with the patient. Please refer to After Visit Summary for other counseling recommendations.  Return in about 2 weeks (around 05/04/2017).   Lavonia Drafts, MD

## 2017-04-21 ENCOUNTER — Telehealth: Payer: Self-pay

## 2017-04-21 LAB — RH TYPE: Rh Factor: NEGATIVE

## 2017-04-21 MED ORDER — ONETOUCH ULTRASOFT LANCETS MISC: 12 refills | Status: DC

## 2017-04-21 MED ORDER — GLUCOSE BLOOD VI STRP: ORAL_STRIP | 12 refills | Status: DC

## 2017-04-21 NOTE — Telephone Encounter (Signed)
Patient called and states that she has a One touch Verio blood glucose meter and need test strips and lancets called into her pharmacy. Kathrene Alu RN BSN

## 2017-04-30 ENCOUNTER — Other Ambulatory Visit: Payer: Self-pay | Admitting: Obstetrics & Gynecology

## 2017-04-30 ENCOUNTER — Ambulatory Visit (HOSPITAL_COMMUNITY)
Admission: RE | Admit: 2017-04-30 | Discharge: 2017-04-30 | Disposition: A | Discharge status: Home / Self Care | Payer: PRIVATE HEALTH INSURANCE | Source: Ambulatory Visit | Attending: Obstetrics & Gynecology | Admitting: Obstetrics & Gynecology

## 2017-04-30 DIAGNOSIS — O99213 Obesity complicating pregnancy, third trimester: Secondary | ICD-10-CM | POA: Insufficient documentation

## 2017-04-30 DIAGNOSIS — Z3A29 29 weeks gestation of pregnancy: Secondary | ICD-10-CM | POA: Diagnosis not present

## 2017-04-30 DIAGNOSIS — O10013 Pre-existing essential hypertension complicating pregnancy, third trimester: Secondary | ICD-10-CM | POA: Diagnosis not present

## 2017-04-30 DIAGNOSIS — O10919 Unspecified pre-existing hypertension complicating pregnancy, unspecified trimester: Secondary | ICD-10-CM

## 2017-04-30 DIAGNOSIS — O34211 Maternal care for low transverse scar from previous cesarean delivery: Secondary | ICD-10-CM | POA: Diagnosis not present

## 2017-04-30 DIAGNOSIS — O99210 Obesity complicating pregnancy, unspecified trimester: Secondary | ICD-10-CM

## 2017-04-30 DIAGNOSIS — O34219 Maternal care for unspecified type scar from previous cesarean delivery: Secondary | ICD-10-CM

## 2017-05-04 ENCOUNTER — Ambulatory Visit (INDEPENDENT_AMBULATORY_CARE_PROVIDER_SITE_OTHER): Payer: PRIVATE HEALTH INSURANCE | Admitting: Obstetrics & Gynecology

## 2017-05-04 VITALS — BP 116/70 | HR 93 | Wt 311.0 lb

## 2017-05-04 DIAGNOSIS — O99213 Obesity complicating pregnancy, third trimester: Secondary | ICD-10-CM

## 2017-05-04 DIAGNOSIS — O99210 Obesity complicating pregnancy, unspecified trimester: Secondary | ICD-10-CM

## 2017-05-04 DIAGNOSIS — Z6791 Unspecified blood type, Rh negative: Secondary | ICD-10-CM | POA: Diagnosis not present

## 2017-05-04 DIAGNOSIS — O10913 Unspecified pre-existing hypertension complicating pregnancy, third trimester: Secondary | ICD-10-CM

## 2017-05-04 DIAGNOSIS — O34219 Maternal care for unspecified type scar from previous cesarean delivery: Secondary | ICD-10-CM

## 2017-05-04 DIAGNOSIS — O3440 Maternal care for other abnormalities of cervix, unspecified trimester: Secondary | ICD-10-CM

## 2017-05-04 DIAGNOSIS — O98519 Other viral diseases complicating pregnancy, unspecified trimester: Secondary | ICD-10-CM

## 2017-05-04 DIAGNOSIS — O10919 Unspecified pre-existing hypertension complicating pregnancy, unspecified trimester: Secondary | ICD-10-CM

## 2017-05-04 DIAGNOSIS — Z3480 Encounter for supervision of other normal pregnancy, unspecified trimester: Secondary | ICD-10-CM

## 2017-05-04 DIAGNOSIS — B009 Herpesviral infection, unspecified: Secondary | ICD-10-CM

## 2017-05-04 DIAGNOSIS — Z87898 Personal history of other specified conditions: Secondary | ICD-10-CM

## 2017-05-04 DIAGNOSIS — Z23 Encounter for immunization: Secondary | ICD-10-CM | POA: Diagnosis not present

## 2017-05-04 DIAGNOSIS — O3443 Maternal care for other abnormalities of cervix, third trimester: Secondary | ICD-10-CM

## 2017-05-04 DIAGNOSIS — O09893 Supervision of other high risk pregnancies, third trimester: Secondary | ICD-10-CM

## 2017-05-04 DIAGNOSIS — O98513 Other viral diseases complicating pregnancy, third trimester: Secondary | ICD-10-CM

## 2017-05-04 DIAGNOSIS — O26899 Other specified pregnancy related conditions, unspecified trimester: Secondary | ICD-10-CM

## 2017-05-04 MED ORDER — RHO D IMMUNE GLOBULIN 1500 UNIT/2ML IJ SOSY
300.0000 ug | PREFILLED_SYRINGE | Freq: Once | INTRAMUSCULAR | Status: AC
  Administered 2017-05-04: 300 ug via INTRAMUSCULAR

## 2017-05-04 NOTE — Progress Notes (Signed)
Pt states that she is having increase in pelvic pain/crampiness.

## 2017-05-04 NOTE — Progress Notes (Signed)
   PRENATAL VISIT NOTE  Subjective:  Courtney Mooney is a 31 y.o. G3P1011 at [redacted]w[redacted]d being seen today for ongoing prenatal care.  She is currently monitored for the following issues for this high-risk pregnancy and has Supervision of other normal pregnancy, antepartum; Previous cesarean delivery, antepartum; Cervical abnormality affecting pregnancy, antepartum; Chronic hypertension during pregnancy, antepartum; Maternal obesity, antepartum; Rh negative state in antepartum period; History of prediabetes; and Herpes simplex type 2 (HSV-2) infection affecting pregnancy, antepartum on her problem list.  Patient reports cramping occ .  Contractions: Not present. Vag. Bleeding: None.  Movement: Present. Denies leaking of fluid.   The following portions of the patient's history were reviewed and updated as appropriate: allergies, current medications, past family history, past medical history, past social history, past surgical history and problem list. Problem list updated.  Objective:   Vitals:   05/04/17 0918  BP: 116/70  Pulse: 93  Weight: (!) 311 lb (141.1 kg)    Fetal Status: Fetal Heart Rate (bpm): 150   Movement: Present     General:  Alert, oriented and cooperative. Patient is in no acute distress.  Skin: Skin is warm and dry. No rash noted.   Cardiovascular: Normal heart rate noted  Respiratory: Normal respiratory effort, no problems with respiration noted  Abdomen: Soft, gravid, appropriate for gestational age.  Pain/Pressure: Present     Pelvic: mild cramping        Extremities: Normal range of motion.     Mental Status:  Normal mood and affect. Normal behavior. Normal judgment and thought content.   Assessment and Plan:  Pregnancy: G3P1011 at [redacted]w[redacted]d  1. Supervision of other normal pregnancy, antepartum Flu vaccine today  2. Rh negative state in antepartum period Rhogam today  3. Previous cesarean delivery, antepartum Desires TOLAC. Papers signed   4. Maternal obesity,  antepartum EFW 52%ile  5. History of prediabetes Fasting glc all above 90. Reviewed with pt diet and increased risk of DM \\2  hour GTT WNL  6. Herpes simplex type 2 (HSV-2) infection affecting pregnancy, antepartumanti antiviral at 36 week  7. Chronic hypertension during pregnancy, antepartum Pt in baby ASA BP WNL on no meds  8. Cervical abnormality affecting pregnancy, antepartum   Preterm labor symptoms and general obstetric precautions including but not limited to vaginal bleeding, contractions, leaking of fluid and fetal movement were reviewed in detail with the patient. Please refer to After Visit Summary for other counseling recommendations.  Return in about 2 weeks (around 05/18/2017).   Lavonia Drafts, MD

## 2017-05-04 NOTE — Patient Instructions (Signed)
Third Trimester of Pregnancy The third trimester is from week 28 through week 40 (months 7 through 9). The third trimester is a time when the unborn baby (fetus) is growing rapidly. At the end of the ninth month, the fetus is about 20 inches in length and weighs 6-10 pounds. Body changes during your third trimester Your body will continue to go through many changes during pregnancy. The changes vary from woman to woman. During the third trimester:  Your weight will continue to increase. You can expect to gain 25-35 pounds (11-16 kg) by the end of the pregnancy.  You may begin to get stretch marks on your hips, abdomen, and breasts.  You may urinate more often because the fetus is moving lower into your pelvis and pressing on your bladder.  You may develop or continue to have heartburn. This is caused by increased hormones that slow down muscles in the digestive tract.  You may develop or continue to have constipation because increased hormones slow digestion and cause the muscles that push waste through your intestines to relax.  You may develop hemorrhoids. These are swollen veins (varicose veins) in the rectum that can itch or be painful.  You may develop swollen, bulging veins (varicose veins) in your legs.  You may have increased body aches in the pelvis, back, or thighs. This is due to weight gain and increased hormones that are relaxing your joints.  You may have changes in your hair. These can include thickening of your hair, rapid growth, and changes in texture. Some women also have hair loss during or after pregnancy, or hair that feels dry or thin. Your hair will most likely return to normal after your baby is born.  Your breasts will continue to grow and they will continue to become tender. A yellow fluid (colostrum) may leak from your breasts. This is the first milk you are producing for your baby.  Your belly button may stick out.  You may notice more swelling in your hands,  face, or ankles.  You may have increased tingling or numbness in your hands, arms, and legs. The skin on your belly may also feel numb.  You may feel short of breath because of your expanding uterus.  You may have more problems sleeping. This can be caused by the size of your belly, increased need to urinate, and an increase in your body's metabolism.  You may notice the fetus "dropping," or moving lower in your abdomen (lightening).  You may have increased vaginal discharge.  You may notice your joints feel loose and you may have pain around your pelvic bone.  What to expect at prenatal visits You will have prenatal exams every 2 weeks until week 36. Then you will have weekly prenatal exams. During a routine prenatal visit:  You will be weighed to make sure you and the baby are growing normally.  Your blood pressure will be taken.  Your abdomen will be measured to track your baby's growth.  The fetal heartbeat will be listened to.  Any test results from the previous visit will be discussed.  You may have a cervical check near your due date to see if your cervix has softened or thinned (effaced).  You will be tested for Group B streptococcus. This happens between 35 and 37 weeks.  Your health care provider may ask you:  What your birth plan is.  How you are feeling.  If you are feeling the baby move.  If you have had   any abnormal symptoms, such as leaking fluid, bleeding, severe headaches, or abdominal cramping.  If you are using any tobacco products, including cigarettes, chewing tobacco, and electronic cigarettes.  If you have any questions.  Other tests or screenings that may be performed during your third trimester include:  Blood tests that check for low iron levels (anemia).  Fetal testing to check the health, activity level, and growth of the fetus. Testing is done if you have certain medical conditions or if there are problems during the  pregnancy.  Nonstress test (NST). This test checks the health of your baby to make sure there are no signs of problems, such as the baby not getting enough oxygen. During this test, a belt is placed around your belly. The baby is made to move, and its heart rate is monitored during movement.  What is false labor? False labor is a condition in which you feel small, irregular tightenings of the muscles in the womb (contractions) that usually go away with rest, changing position, or drinking water. These are called Braxton Hicks contractions. Contractions may last for hours, days, or even weeks before true labor sets in. If contractions come at regular intervals, become more frequent, increase in intensity, or become painful, you should see your health care provider. What are the signs of labor?  Abdominal cramps.  Regular contractions that start at 10 minutes apart and become stronger and more frequent with time.  Contractions that start on the top of the uterus and spread down to the lower abdomen and back.  Increased pelvic pressure and dull back pain.  A watery or bloody mucus discharge that comes from the vagina.  Leaking of amniotic fluid. This is also known as your "water breaking." It could be a slow trickle or a gush. Let your health care provider know if it has a color or strange odor. If you have any of these signs, call your health care provider right away, even if it is before your due date. Follow these instructions at home: Medicines  Follow your health care provider's instructions regarding medicine use. Specific medicines may be either safe or unsafe to take during pregnancy.  Take a prenatal vitamin that contains at least 600 micrograms (mcg) of folic acid.  If you develop constipation, try taking a stool softener if your health care provider approves. Eating and drinking  Eat a balanced diet that includes fresh fruits and vegetables, whole grains, good sources of protein  such as meat, eggs, or tofu, and low-fat dairy. Your health care provider will help you determine the amount of weight gain that is right for you.  Avoid raw meat and uncooked cheese. These carry germs that can cause birth defects in the baby.  If you have low calcium intake from food, talk to your health care provider about whether you should take a daily calcium supplement.  Eat four or five small meals rather than three large meals a day.  Limit foods that are high in fat and processed sugars, such as fried and sweet foods.  To prevent constipation: ? Drink enough fluid to keep your urine clear or pale yellow. ? Eat foods that are high in fiber, such as fresh fruits and vegetables, whole grains, and beans. Activity  Exercise only as directed by your health care provider. Most women can continue their usual exercise routine during pregnancy. Try to exercise for 30 minutes at least 5 days a week. Stop exercising if you experience uterine contractions.  Avoid heavy   lifting.  Do not exercise in extreme heat or humidity, or at high altitudes.  Wear low-heel, comfortable shoes.  Practice good posture.  You may continue to have sex unless your health care provider tells you otherwise. Relieving pain and discomfort  Take frequent breaks and rest with your legs elevated if you have leg cramps or low back pain.  Take warm sitz baths to soothe any pain or discomfort caused by hemorrhoids. Use hemorrhoid cream if your health care provider approves.  Wear a good support bra to prevent discomfort from breast tenderness.  If you develop varicose veins: ? Wear support pantyhose or compression stockings as told by your healthcare provider. ? Elevate your feet for 15 minutes, 3-4 times a day. Prenatal care  Write down your questions. Take them to your prenatal visits.  Keep all your prenatal visits as told by your health care provider. This is important. Safety  Wear your seat belt at  all times when driving.  Make a list of emergency phone numbers, including numbers for family, friends, the hospital, and police and fire departments. General instructions  Avoid cat litter boxes and soil used by cats. These carry germs that can cause birth defects in the baby. If you have a cat, ask someone to clean the litter box for you.  Do not travel far distances unless it is absolutely necessary and only with the approval of your health care provider.  Do not use hot tubs, steam rooms, or saunas.  Do not drink alcohol.  Do not use any products that contain nicotine or tobacco, such as cigarettes and e-cigarettes. If you need help quitting, ask your health care provider.  Do not use any medicinal herbs or unprescribed drugs. These chemicals affect the formation and growth of the baby.  Do not douche or use tampons or scented sanitary pads.  Do not cross your legs for long periods of time.  To prepare for the arrival of your baby: ? Take prenatal classes to understand, practice, and ask questions about labor and delivery. ? Make a trial run to the hospital. ? Visit the hospital and tour the maternity area. ? Arrange for maternity or paternity leave through employers. ? Arrange for family and friends to take care of pets while you are in the hospital. ? Purchase a rear-facing car seat and make sure you know how to install it in your car. ? Pack your hospital bag. ? Prepare the baby's nursery. Make sure to remove all pillows and stuffed animals from the baby's crib to prevent suffocation.  Visit your dentist if you have not gone during your pregnancy. Use a soft toothbrush to brush your teeth and be gentle when you floss. Contact a health care provider if:  You are unsure if you are in labor or if your water has broken.  You become dizzy.  You have mild pelvic cramps, pelvic pressure, or nagging pain in your abdominal area.  You have lower back pain.  You have persistent  nausea, vomiting, or diarrhea.  You have an unusual or bad smelling vaginal discharge.  You have pain when you urinate. Get help right away if:  Your water breaks before 37 weeks.  You have regular contractions less than 5 minutes apart before 37 weeks.  You have a fever.  You are leaking fluid from your vagina.  You have spotting or bleeding from your vagina.  You have severe abdominal pain or cramping.  You have rapid weight loss or weight gain.    You have shortness of breath with chest pain.  You notice sudden or extreme swelling of your face, hands, ankles, feet, or legs.  Your baby makes fewer than 10 movements in 2 hours.  You have severe headaches that do not go away when you take medicine.  You have vision changes. Summary  The third trimester is from week 28 through week 40, months 7 through 9. The third trimester is a time when the unborn baby (fetus) is growing rapidly.  During the third trimester, your discomfort may increase as you and your baby continue to gain weight. You may have abdominal, leg, and back pain, sleeping problems, and an increased need to urinate.  During the third trimester your breasts will keep growing and they will continue to become tender. A yellow fluid (colostrum) may leak from your breasts. This is the first milk you are producing for your baby.  False labor is a condition in which you feel small, irregular tightenings of the muscles in the womb (contractions) that eventually go away. These are called Braxton Hicks contractions. Contractions may last for hours, days, or even weeks before true labor sets in.  Signs of labor can include: abdominal cramps; regular contractions that start at 10 minutes apart and become stronger and more frequent with time; watery or bloody mucus discharge that comes from the vagina; increased pelvic pressure and dull back pain; and leaking of amniotic fluid. This information is not intended to replace advice  given to you by your health care provider. Make sure you discuss any questions you have with your health care provider. Document Released: 07/08/2001 Document Revised: 12/20/2015 Document Reviewed: 09/14/2012 Elsevier Interactive Patient Education  2017 [employer]  

## 2017-05-12 ENCOUNTER — Ambulatory Visit (INDEPENDENT_AMBULATORY_CARE_PROVIDER_SITE_OTHER): Payer: PRIVATE HEALTH INSURANCE | Admitting: *Deleted

## 2017-05-12 VITALS — BP 125/80 | HR 100

## 2017-05-12 DIAGNOSIS — O10913 Unspecified pre-existing hypertension complicating pregnancy, third trimester: Secondary | ICD-10-CM | POA: Diagnosis not present

## 2017-05-12 DIAGNOSIS — O10919 Unspecified pre-existing hypertension complicating pregnancy, unspecified trimester: Secondary | ICD-10-CM

## 2017-05-12 NOTE — Progress Notes (Signed)
Pt in office today for NST for [facility]. Reviewed NST with M.Williams, CNM- reactive NST today.

## 2017-05-12 NOTE — Progress Notes (Signed)
Patient ID: Courtney Mooney, female   DOB: 1985-10-25, 31 y.o.   MRN: [id number] Reviewed nonstress test Baseline variability is average Good accelerations meet criteria  Category I tracing  Seabron Spates, CNM

## 2017-05-15 ENCOUNTER — Ambulatory Visit (INDEPENDENT_AMBULATORY_CARE_PROVIDER_SITE_OTHER): Payer: PRIVATE HEALTH INSURANCE

## 2017-05-15 DIAGNOSIS — Z3689 Encounter for other specified antenatal screening: Secondary | ICD-10-CM

## 2017-05-15 DIAGNOSIS — O10913 Unspecified pre-existing hypertension complicating pregnancy, third trimester: Secondary | ICD-10-CM

## 2017-05-19 ENCOUNTER — Ambulatory Visit (INDEPENDENT_AMBULATORY_CARE_PROVIDER_SITE_OTHER): Payer: PRIVATE HEALTH INSURANCE | Admitting: Advanced Practice Midwife

## 2017-05-19 ENCOUNTER — Encounter: Payer: Self-pay | Admitting: Advanced Practice Midwife

## 2017-05-19 VITALS — BP 128/64 | HR 92 | Wt 312.0 lb

## 2017-05-19 DIAGNOSIS — O34219 Maternal care for unspecified type scar from previous cesarean delivery: Secondary | ICD-10-CM

## 2017-05-19 DIAGNOSIS — O10913 Unspecified pre-existing hypertension complicating pregnancy, third trimester: Secondary | ICD-10-CM | POA: Diagnosis not present

## 2017-05-19 DIAGNOSIS — Z87898 Personal history of other specified conditions: Secondary | ICD-10-CM

## 2017-05-19 DIAGNOSIS — O10919 Unspecified pre-existing hypertension complicating pregnancy, unspecified trimester: Secondary | ICD-10-CM

## 2017-05-19 NOTE — Progress Notes (Signed)
   PRENATAL VISIT NOTE  Subjective:  Courtney Mooney is a 31 y.o. G3P1011 at [redacted]w[redacted]d being seen today for ongoing prenatal care.  She is currently monitored for the following issues for this high-risk pregnancy and has Supervision of other normal pregnancy, antepartum; Previous cesarean delivery, antepartum; Cervical abnormality affecting pregnancy, antepartum; Chronic hypertension during pregnancy, antepartum; Maternal obesity, antepartum; Rh negative state in antepartum period; History of prediabetes; and Herpes simplex type 2 (HSV-2) infection affecting pregnancy, antepartum on her problem list.  Patient reports no complaints.  Contractions: Not present. Vag. Bleeding: None.  Movement: Present. Denies leaking of fluid.   The following portions of the patient's history were reviewed and updated as appropriate: allergies, current medications, past family history, past medical history, past social history, past surgical history and problem list. Problem list updated.  Objective:   Vitals:   05/19/17 0824  BP: 128/64  Pulse: 92  Weight: (!) 312 lb (141.5 kg)    Fetal Status: Fetal Heart Rate (bpm): NST   Movement: Present     General:  Alert, oriented and cooperative. Patient is in no acute distress.  Skin: Skin is warm and dry. No rash noted.   Cardiovascular: Normal heart rate noted  Respiratory: Normal respiratory effort, no problems with respiration noted  Abdomen: Soft, gravid, appropriate for gestational age.  Pain/Pressure: Present     Pelvic: Cervical exam deferred        Extremities: Normal range of motion.  Edema: None  Mental Status:  Normal mood and affect. Normal behavior. Normal judgment and thought content.   NST reactive  Assessment and Plan:  Pregnancy: G3P1011 at [redacted]w[redacted]d  1. Supervision of other normal pregnancy, antepartum      Flu shot last visit  2. Rh negative state in antepartum period Rhogam done last visit  3. Previous cesarean delivery,  antepartum Desires TOLAC. Papers signed last visit  4. Maternal obesity, antepartum      [place] scheduled 34 wks  5. History of prediabetes FBS 90-103. 2 hour post meal values under 120.    Reviewed with pt diet and increased risk of DM \\2  hour GTT WNL  6. Herpes simplex type 2 (HSV-2) infection affecting pregnancy, antepartumanti antiviral at 36 week  7. Chronic hypertension during pregnancy, antepartum Pt on baby ASA BP WNL on no meds Will schedule [place] for 34 weeks per protocol  8. Cervical abnormality affecting pregnancy, antepartum  Preterm labor symptoms and general obstetric precautions including but not limited to vaginal bleeding, contractions, leaking of fluid and fetal movement were reviewed in detail with the patient. Please refer to After Visit Summary for other counseling recommendations.     Hansel Feinstein, CNM

## 2017-05-19 NOTE — Patient Instructions (Signed)
Third Trimester of Pregnancy The third trimester is from week 28 through week 40 (months 7 through 9). The third trimester is a time when the unborn baby (fetus) is growing rapidly. At the end of the ninth month, the fetus is about 20 inches in length and weighs 6-10 pounds. Body changes during your third trimester Your body will continue to go through many changes during pregnancy. The changes vary from woman to woman. During the third trimester:  Your weight will continue to increase. You can expect to gain 25-35 pounds (11-16 kg) by the end of the pregnancy.  You may begin to get stretch marks on your hips, abdomen, and breasts.  You may urinate more often because the fetus is moving lower into your pelvis and pressing on your bladder.  You may develop or continue to have heartburn. This is caused by increased hormones that slow down muscles in the digestive tract.  You may develop or continue to have constipation because increased hormones slow digestion and cause the muscles that push waste through your intestines to relax.  You may develop hemorrhoids. These are swollen veins (varicose veins) in the rectum that can itch or be painful.  You may develop swollen, bulging veins (varicose veins) in your legs.  You may have increased body aches in the pelvis, back, or thighs. This is due to weight gain and increased hormones that are relaxing your joints.  You may have changes in your hair. These can include thickening of your hair, rapid growth, and changes in texture. Some women also have hair loss during or after pregnancy, or hair that feels dry or thin. Your hair will most likely return to normal after your baby is born.  Your breasts will continue to grow and they will continue to become tender. A yellow fluid (colostrum) may leak from your breasts. This is the first milk you are producing for your baby.  Your belly button may stick out.  You may notice more swelling in your hands,  face, or ankles.  You may have increased tingling or numbness in your hands, arms, and legs. The skin on your belly may also feel numb.  You may feel short of breath because of your expanding uterus.  You may have more problems sleeping. This can be caused by the size of your belly, increased need to urinate, and an increase in your body's metabolism.  You may notice the fetus "dropping," or moving lower in your abdomen (lightening).  You may have increased vaginal discharge.  You may notice your joints feel loose and you may have pain around your pelvic bone.  What to expect at prenatal visits You will have prenatal exams every 2 weeks until week 36. Then you will have weekly prenatal exams. During a routine prenatal visit:  You will be weighed to make sure you and the baby are growing normally.  Your blood pressure will be taken.  Your abdomen will be measured to track your baby's growth.  The fetal heartbeat will be listened to.  Any test results from the previous visit will be discussed.  You may have a cervical check near your due date to see if your cervix has softened or thinned (effaced).  You will be tested for Group B streptococcus. This happens between 35 and 37 weeks.  Your health care provider may ask you:  What your birth plan is.  How you are feeling.  If you are feeling the baby move.  If you have had   any abnormal symptoms, such as leaking fluid, bleeding, severe headaches, or abdominal cramping.  If you are using any tobacco products, including cigarettes, chewing tobacco, and electronic cigarettes.  If you have any questions.  Other tests or screenings that may be performed during your third trimester include:  Blood tests that check for low iron levels (anemia).  Fetal testing to check the health, activity level, and growth of the fetus. Testing is done if you have certain medical conditions or if there are problems during the  pregnancy.  Nonstress test (NST). This test checks the health of your baby to make sure there are no signs of problems, such as the baby not getting enough oxygen. During this test, a belt is placed around your belly. The baby is made to move, and its heart rate is monitored during movement.  What is false labor? False labor is a condition in which you feel small, irregular tightenings of the muscles in the womb (contractions) that usually go away with rest, changing position, or drinking water. These are called Braxton Hicks contractions. Contractions may last for hours, days, or even weeks before true labor sets in. If contractions come at regular intervals, become more frequent, increase in intensity, or become painful, you should see your health care provider. What are the signs of labor?  Abdominal cramps.  Regular contractions that start at 10 minutes apart and become stronger and more frequent with time.  Contractions that start on the top of the uterus and spread down to the lower abdomen and back.  Increased pelvic pressure and dull back pain.  A watery or bloody mucus discharge that comes from the vagina.  Leaking of amniotic fluid. This is also known as your "water breaking." It could be a slow trickle or a gush. Let your health care provider know if it has a color or strange odor. If you have any of these signs, call your health care provider right away, even if it is before your due date. Follow these instructions at home: Medicines  Follow your health care provider's instructions regarding medicine use. Specific medicines may be either safe or unsafe to take during pregnancy.  Take a prenatal vitamin that contains at least 600 micrograms (mcg) of folic acid.  If you develop constipation, try taking a stool softener if your health care provider approves. Eating and drinking  Eat a balanced diet that includes fresh fruits and vegetables, whole grains, good sources of protein  such as meat, eggs, or tofu, and low-fat dairy. Your health care provider will help you determine the amount of weight gain that is right for you.  Avoid raw meat and uncooked cheese. These carry germs that can cause birth defects in the baby.  If you have low calcium intake from food, talk to your health care provider about whether you should take a daily calcium supplement.  Eat four or five small meals rather than three large meals a day.  Limit foods that are high in fat and processed sugars, such as fried and sweet foods.  To prevent constipation: ? Drink enough fluid to keep your urine clear or pale yellow. ? Eat foods that are high in fiber, such as fresh fruits and vegetables, whole grains, and beans. Activity  Exercise only as directed by your health care provider. Most women can continue their usual exercise routine during pregnancy. Try to exercise for 30 minutes at least 5 days a week. Stop exercising if you experience uterine contractions.  Avoid heavy   lifting.  Do not exercise in extreme heat or humidity, or at high altitudes.  Wear low-heel, comfortable shoes.  Practice good posture.  You may continue to have sex unless your health care provider tells you otherwise. Relieving pain and discomfort  Take frequent breaks and rest with your legs elevated if you have leg cramps or low back pain.  Take warm sitz baths to soothe any pain or discomfort caused by hemorrhoids. Use hemorrhoid cream if your health care provider approves.  Wear a good support bra to prevent discomfort from breast tenderness.  If you develop varicose veins: ? Wear support pantyhose or compression stockings as told by your healthcare provider. ? Elevate your feet for 15 minutes, 3-4 times a day. Prenatal care  Write down your questions. Take them to your prenatal visits.  Keep all your prenatal visits as told by your health care provider. This is important. Safety  Wear your seat belt at  all times when driving.  Make a list of emergency phone numbers, including numbers for family, friends, the hospital, and police and fire departments. General instructions  Avoid cat litter boxes and soil used by cats. These carry germs that can cause birth defects in the baby. If you have a cat, ask someone to clean the litter box for you.  Do not travel far distances unless it is absolutely necessary and only with the approval of your health care provider.  Do not use hot tubs, steam rooms, or saunas.  Do not drink alcohol.  Do not use any products that contain nicotine or tobacco, such as cigarettes and e-cigarettes. If you need help quitting, ask your health care provider.  Do not use any medicinal herbs or unprescribed drugs. These chemicals affect the formation and growth of the baby.  Do not douche or use tampons or scented sanitary pads.  Do not cross your legs for long periods of time.  To prepare for the arrival of your baby: ? Take prenatal classes to understand, practice, and ask questions about labor and delivery. ? Make a trial run to the hospital. ? Visit the hospital and tour the maternity area. ? Arrange for maternity or paternity leave through employers. ? Arrange for family and friends to take care of pets while you are in the hospital. ? Purchase a rear-facing car seat and make sure you know how to install it in your car. ? Pack your hospital bag. ? Prepare the baby's nursery. Make sure to remove all pillows and stuffed animals from the baby's crib to prevent suffocation.  Visit your dentist if you have not gone during your pregnancy. Use a soft toothbrush to brush your teeth and be gentle when you floss. Contact a health care provider if:  You are unsure if you are in labor or if your water has broken.  You become dizzy.  You have mild pelvic cramps, pelvic pressure, or nagging pain in your abdominal area.  You have lower back pain.  You have persistent  nausea, vomiting, or diarrhea.  You have an unusual or bad smelling vaginal discharge.  You have pain when you urinate. Get help right away if:  Your water breaks before 37 weeks.  You have regular contractions less than 5 minutes apart before 37 weeks.  You have a fever.  You are leaking fluid from your vagina.  You have spotting or bleeding from your vagina.  You have severe abdominal pain or cramping.  You have rapid weight loss or weight gain.    You have shortness of breath with chest pain.  You notice sudden or extreme swelling of your face, hands, ankles, feet, or legs.  Your baby makes fewer than 10 movements in 2 hours.  You have severe headaches that do not go away when you take medicine.  You have vision changes. Summary  The third trimester is from week 28 through week 40, months 7 through 9. The third trimester is a time when the unborn baby (fetus) is growing rapidly.  During the third trimester, your discomfort may increase as you and your baby continue to gain weight. You may have abdominal, leg, and back pain, sleeping problems, and an increased need to urinate.  During the third trimester your breasts will keep growing and they will continue to become tender. A yellow fluid (colostrum) may leak from your breasts. This is the first milk you are producing for your baby.  False labor is a condition in which you feel small, irregular tightenings of the muscles in the womb (contractions) that eventually go away. These are called Braxton Hicks contractions. Contractions may last for hours, days, or even weeks before true labor sets in.  Signs of labor can include: abdominal cramps; regular contractions that start at 10 minutes apart and become stronger and more frequent with time; watery or bloody mucus discharge that comes from the vagina; increased pelvic pressure and dull back pain; and leaking of amniotic fluid. This information is not intended to replace advice  given to you by your health care provider. Make sure you discuss any questions you have with your health care provider. Document Released: 07/08/2001 Document Revised: 12/20/2015 Document Reviewed: 09/14/2012 Elsevier Interactive Patient Education  2017 [employer]  

## 2017-05-22 ENCOUNTER — Ambulatory Visit (INDEPENDENT_AMBULATORY_CARE_PROVIDER_SITE_OTHER): Payer: PRIVATE HEALTH INSURANCE

## 2017-05-22 VITALS — BP 116/76 | HR 80 | Wt 312.0 lb

## 2017-05-22 DIAGNOSIS — O10919 Unspecified pre-existing hypertension complicating pregnancy, unspecified trimester: Secondary | ICD-10-CM

## 2017-05-22 DIAGNOSIS — O10913 Unspecified pre-existing hypertension complicating pregnancy, third trimester: Secondary | ICD-10-CM

## 2017-05-22 NOTE — Progress Notes (Signed)
NST only visit. Kathrene Alu RNBSN  NST reviewed and reactive.  HARRAWAY-SMITH, CAROLYN

## 2017-05-26 ENCOUNTER — Ambulatory Visit (INDEPENDENT_AMBULATORY_CARE_PROVIDER_SITE_OTHER): Payer: PRIVATE HEALTH INSURANCE

## 2017-05-26 VITALS — BP 126/80 | HR 103

## 2017-05-26 DIAGNOSIS — O10913 Unspecified pre-existing hypertension complicating pregnancy, third trimester: Secondary | ICD-10-CM | POA: Diagnosis not present

## 2017-05-26 DIAGNOSIS — O10919 Unspecified pre-existing hypertension complicating pregnancy, unspecified trimester: Secondary | ICD-10-CM

## 2017-05-26 NOTE — Progress Notes (Signed)
NST Only visit. Kathrene Alu RNBSN

## 2017-05-29 ENCOUNTER — Ambulatory Visit (INDEPENDENT_AMBULATORY_CARE_PROVIDER_SITE_OTHER): Payer: PRIVATE HEALTH INSURANCE

## 2017-05-29 VITALS — BP 117/79 | HR 92

## 2017-05-29 DIAGNOSIS — O10913 Unspecified pre-existing hypertension complicating pregnancy, third trimester: Secondary | ICD-10-CM

## 2017-05-29 DIAGNOSIS — O10919 Unspecified pre-existing hypertension complicating pregnancy, unspecified trimester: Secondary | ICD-10-CM

## 2017-06-02 ENCOUNTER — Ambulatory Visit (INDEPENDENT_AMBULATORY_CARE_PROVIDER_SITE_OTHER): Payer: PRIVATE HEALTH INSURANCE | Admitting: Advanced Practice Midwife

## 2017-06-02 VITALS — BP 119/77 | HR 92 | Wt 311.0 lb

## 2017-06-02 DIAGNOSIS — B009 Herpesviral infection, unspecified: Secondary | ICD-10-CM

## 2017-06-02 DIAGNOSIS — O26899 Other specified pregnancy related conditions, unspecified trimester: Secondary | ICD-10-CM

## 2017-06-02 DIAGNOSIS — O98519 Other viral diseases complicating pregnancy, unspecified trimester: Secondary | ICD-10-CM

## 2017-06-02 DIAGNOSIS — O3443 Maternal care for other abnormalities of cervix, third trimester: Secondary | ICD-10-CM

## 2017-06-02 DIAGNOSIS — O09893 Supervision of other high risk pregnancies, third trimester: Secondary | ICD-10-CM

## 2017-06-02 DIAGNOSIS — Z87898 Personal history of other specified conditions: Secondary | ICD-10-CM

## 2017-06-02 DIAGNOSIS — O10913 Unspecified pre-existing hypertension complicating pregnancy, third trimester: Secondary | ICD-10-CM | POA: Diagnosis not present

## 2017-06-02 DIAGNOSIS — O34219 Maternal care for unspecified type scar from previous cesarean delivery: Secondary | ICD-10-CM

## 2017-06-02 DIAGNOSIS — O3440 Maternal care for other abnormalities of cervix, unspecified trimester: Secondary | ICD-10-CM

## 2017-06-02 DIAGNOSIS — Z6791 Unspecified blood type, Rh negative: Secondary | ICD-10-CM

## 2017-06-02 DIAGNOSIS — O10919 Unspecified pre-existing hypertension complicating pregnancy, unspecified trimester: Secondary | ICD-10-CM

## 2017-06-02 DIAGNOSIS — O98513 Other viral diseases complicating pregnancy, third trimester: Secondary | ICD-10-CM

## 2017-06-02 DIAGNOSIS — Z3480 Encounter for supervision of other normal pregnancy, unspecified trimester: Secondary | ICD-10-CM

## 2017-06-02 MED ORDER — VALACYCLOVIR HCL 500 MG PO TABS: 500.0000 mg | ORAL_TABLET | Freq: Two times a day (BID) | ORAL | 6 refills | Status: DC

## 2017-06-02 NOTE — Patient Instructions (Addendum)
Braxton Hicks Contractions Contractions of the uterus can occur throughout pregnancy, but they are not always a sign that you are in labor. You may have practice contractions called Braxton Hicks contractions. These false labor contractions are sometimes confused with true labor. What are [place] contractions? Braxton Hicks contractions are tightening movements that occur in the muscles of the uterus before labor. Unlike true labor contractions, these contractions do not result in opening (dilation) and thinning of the cervix. Toward the end of pregnancy (32-34 weeks), Braxton Hicks contractions can happen more often and may become stronger. These contractions are sometimes difficult to tell apart from true labor because they can be very uncomfortable. You should not feel embarrassed if you go to the hospital with false labor. Sometimes, the only way to tell if you are in true labor is for your health care provider to look for changes in the cervix. The health care provider will do a physical exam and may monitor your contractions. If you are not in true labor, the exam should show that your cervix is not dilating and your water has not broken. If there are no prenatal problems or other health problems associated with your pregnancy, it is completely safe for you to be sent home with false labor. You may continue to have Braxton Hicks contractions until you go into true labor. How can I tell the difference between true labor and false labor?  Differences ? False labor ? Contractions last 30-70 seconds.: Contractions are usually shorter and not as strong as true labor contractions. ? Contractions become very regular.: Contractions are usually irregular. ? Discomfort is usually felt in the top of the uterus, and it spreads to the lower abdomen and low back.: Contractions are often felt in the front of the lower abdomen and in the groin. ? Contractions do not go away with walking.: Contractions may  go away when you walk around or change positions while lying down. ? Contractions usually become more intense and increase in frequency.: Contractions get weaker and are shorter-lasting as time goes on. ? The cervix dilates and gets thinner.: The cervix usually does not dilate or become thin. Follow these instructions at home:  Take over-the-counter and prescription medicines only as told by your health care provider.  Keep up with your usual exercises and follow other instructions from your health care provider.  Eat and drink lightly if you think you are going into labor.  If Braxton Hicks contractions are making you uncomfortable: ? Change your position from lying down or resting to walking, or change from walking to resting. ? Sit and rest in a tub of warm water. ? Drink enough fluid to keep your urine clear or pale yellow. Dehydration may cause these contractions. ? Do slow and deep breathing several times an hour.  Keep all follow-up prenatal visits as told by your health care provider. This is important. Contact a health care provider if:  You have a fever.  You have continuous pain in your abdomen. Get help right away if:  Your contractions become stronger, more regular, and closer together.  You have fluid leaking or gushing from your vagina.  You pass blood-tinged mucus (bloody show).  You have bleeding from your vagina.  You have low back pain that you never had before.  You feel your baby's head pushing down and causing pelvic pressure.  Your baby is not moving inside you as much as it used to. Summary  Contractions that occur before labor are  called Braxton Hicks contractions, false labor, or practice contractions.  Braxton Hicks contractions are usually shorter, weaker, farther apart, and less regular than true labor contractions. True labor contractions usually become progressively stronger and regular and they become more frequent.  Manage discomfort from  [facility] contractions by changing position, resting in a warm bath, drinking plenty of water, or practicing deep breathing. This information is not intended to replace advice given to you by your health care provider. Make sure you discuss any questions you have with your health care provider. Document Released: 07/14/2005 Document Revised: 06/02/2016 Document Reviewed: 06/02/2016 Elsevier Interactive Patient Education  2017 [place] [address] [place], [place]  [address] Phone - [phone]   Fax - [phone]  [facility] [address] [place], [place] [address] Phone - [phone]   Fax - [place] [address] [place], [place]  [address] Phone - [phone]   Fax - [phone]  [place]. [address], [place] [address] [place], [place]  [address] Phone - [phone]   Fax - [phone]  [place] [address] [place], [place]  [address] Phone - [phone]   Fax - [phone]  [facility] [address] [place], [place]  [address] Phone - [phone]   Fax - [place] [address], [place], [place]  [address] Phone - [phone]   Fax - [phone]  [place] [address] [place], [place] [address] [place], [place]  [address] Phone - [phone]   Fax - [place] [address] [place], [place]  [address] Phone - [phone]   Fax - [phone] [facility] [place] [address] [place], [place]  [address] Phone - [phone]   Fax - [phone]  [place] [address] [place], [place]  [address] Phone - [phone]   Fax - [phone]  [facility] [place], [place], [place]  [address] Phone - [phone]   Fax - [place] [address], [place], [place]  [address] Phone - [phone]   Fax - [phone]  [place] [address], [place], [place]  [address] Phone - [place], [place]  [address] Phone - [phone]   Fax - [place] [address] [place], [place]  [address] Phone - [phone]   Fax - [phone]  [place] [address] [place], [place]  [address] Phone - [phone]   Fax - [place]. [place], [place]  [address] Phone - [phone]   Fax - [place], [place], [place]  [address] Phone - [phone]   Fax - [place] [address], [place], [place]  [address] Phone - [phone]   Fax - [phone]  [place]UB[place] [address], [place], [place]  [address] Phone - [phone]   Fax - [place] [address], [place], [place]  [address] Phone - [phone]   Fax - [place],  [place]  [address] Phone - [phone]   Fax - [phone] [place]naldo [place] [phone] [address] [place], [place]  [address] Phone - [phone]   Fax - [place] [address] [place], [place]  [address] Phone - [phone]   Fax - [place] [address], [place], [place]  [address] Phone - [phone]   Fax - [phone]  [place] MD [address] [place] [address] Phone [phone]  Fax [phone]

## 2017-06-02 NOTE — Progress Notes (Signed)
   PRENATAL VISIT NOTE  Subjective:  Courtney Mooney is a 31 y.o. G3P1011 at [redacted]w[redacted]d being seen today for ongoing prenatal care.  She is currently monitored for the following issues for this high-risk pregnancy and has Supervision of other normal pregnancy, antepartum; Previous cesarean delivery, antepartum; Cervical abnormality affecting pregnancy, antepartum; Chronic hypertension during pregnancy, antepartum; Maternal obesity, antepartum; Rh negative state in antepartum period; History of prediabetes; and Herpes simplex type 2 (HSV-2) infection affecting pregnancy, antepartum on their problem list.  Patient reports no complaints.  Contractions: Not present.  .  Movement: Present. Denies leaking of fluid.   The following portions of the patient's history were reviewed and updated as appropriate: allergies, current medications, past family history, past medical history, past social history, past surgical history and problem list. Problem list updated.  Objective:   Vitals:   06/02/17 0843  BP: 119/77  Pulse: 92  Weight: (!) 311 lb (141.1 kg)    Fetal Status: Fetal Heart Rate (bpm): NST Fundal Height: 35 cm Movement: Present     General:  Alert, oriented and cooperative. Patient is in no acute distress.  Skin: Skin is warm and dry. No rash noted.   Cardiovascular: Normal heart rate noted  Respiratory: Normal respiratory effort, no problems with respiration noted  Abdomen: Soft, gravid, appropriate for gestational age.  Pain/Pressure: Present     Pelvic: Cervical exam deferred        Extremities: Normal range of motion.  Edema: Trace  Mental Status:  Normal mood and affect. Normal behavior. Normal judgment and thought content.   Assessment and Plan:  Pregnancy: G3P1011 at [redacted]w[redacted]d  1. Chronic hypertension during pregnancy, antepartum--Nml BP off meds - Growth [place] scheduled - [place] MFM FETAL BPP WO NON STRESS; Future  2. Supervision of other normal pregnancy, antepartum  - [place] MFM FETAL  BPP WO NON STRESS; Future  3. Rh negative state in antepartum period   4. Previous cesarean delivery, antepartum  - Planning TOLAC - [place] MFM FETAL BPP WO NON STRESS; Future  5. History of prediabetes   6. Herpes simplex type 2 (HSV-2) infection affecting pregnancy, antepartum-No outbreak or prodrome  - valACYclovir (VALTREX) 500 MG tablet; Take 1 tablet (500 mg total) 2 (two) times daily by mouth.  Dispense: 30 tablet; Refill: 6  7. Cervical abnormality affecting pregnancy, antepartum   Preterm labor symptoms and general obstetric precautions including but not limited to vaginal bleeding, contractions, leaking of fluid and fetal movement were reviewed in detail with the patient. Please refer to After Visit Summary for other counseling recommendations.  Return in about 2 weeks (around 06/16/2017) for ROB.   Manya Silvas, CNM

## 2017-06-05 ENCOUNTER — Ambulatory Visit (HOSPITAL_COMMUNITY)
Admission: RE | Admit: 2017-06-05 | Discharge: 2017-06-05 | Disposition: A | Discharge status: Home / Self Care | Payer: PRIVATE HEALTH INSURANCE | Source: Ambulatory Visit | Attending: Advanced Practice Midwife | Admitting: Advanced Practice Midwife

## 2017-06-05 ENCOUNTER — Other Ambulatory Visit: Payer: Self-pay | Admitting: Advanced Practice Midwife

## 2017-06-05 ENCOUNTER — Other Ambulatory Visit: Payer: PRIVATE HEALTH INSURANCE

## 2017-06-05 ENCOUNTER — Encounter (HOSPITAL_COMMUNITY): Payer: Self-pay

## 2017-06-05 ENCOUNTER — Ambulatory Visit (HOSPITAL_COMMUNITY): Payer: PRIVATE HEALTH INSURANCE

## 2017-06-05 DIAGNOSIS — E669 Obesity, unspecified: Secondary | ICD-10-CM | POA: Diagnosis not present

## 2017-06-05 DIAGNOSIS — O99213 Obesity complicating pregnancy, third trimester: Secondary | ICD-10-CM | POA: Diagnosis not present

## 2017-06-05 DIAGNOSIS — O26899 Other specified pregnancy related conditions, unspecified trimester: Secondary | ICD-10-CM

## 2017-06-05 DIAGNOSIS — O34219 Maternal care for unspecified type scar from previous cesarean delivery: Secondary | ICD-10-CM | POA: Insufficient documentation

## 2017-06-05 DIAGNOSIS — Z3A34 34 weeks gestation of pregnancy: Secondary | ICD-10-CM

## 2017-06-05 DIAGNOSIS — O99210 Obesity complicating pregnancy, unspecified trimester: Secondary | ICD-10-CM

## 2017-06-05 DIAGNOSIS — Z362 Encounter for other antenatal screening follow-up: Secondary | ICD-10-CM | POA: Insufficient documentation

## 2017-06-05 DIAGNOSIS — O10013 Pre-existing essential hypertension complicating pregnancy, third trimester: Secondary | ICD-10-CM | POA: Insufficient documentation

## 2017-06-05 DIAGNOSIS — Z3480 Encounter for supervision of other normal pregnancy, unspecified trimester: Secondary | ICD-10-CM

## 2017-06-05 DIAGNOSIS — O10919 Unspecified pre-existing hypertension complicating pregnancy, unspecified trimester: Secondary | ICD-10-CM

## 2017-06-05 DIAGNOSIS — O98519 Other viral diseases complicating pregnancy, unspecified trimester: Secondary | ICD-10-CM

## 2017-06-05 DIAGNOSIS — Z6791 Unspecified blood type, Rh negative: Secondary | ICD-10-CM

## 2017-06-05 DIAGNOSIS — Z87898 Personal history of other specified conditions: Secondary | ICD-10-CM

## 2017-06-05 DIAGNOSIS — Z6841 Body Mass Index (BMI) 40.0 and over, adult: Secondary | ICD-10-CM | POA: Diagnosis not present

## 2017-06-05 DIAGNOSIS — B009 Herpesviral infection, unspecified: Secondary | ICD-10-CM

## 2017-06-05 DIAGNOSIS — O3440 Maternal care for other abnormalities of cervix, unspecified trimester: Secondary | ICD-10-CM

## 2017-06-05 NOTE — Addendum Note (Signed)
Encounter addended by: Hessie Dibble, RDMS on: 06/05/2017 3:18 PM  Actions taken: Imaging Exam ended

## 2017-06-09 ENCOUNTER — Other Ambulatory Visit: Payer: PRIVATE HEALTH INSURANCE

## 2017-06-12 ENCOUNTER — Ambulatory Visit (INDEPENDENT_AMBULATORY_CARE_PROVIDER_SITE_OTHER): Payer: PRIVATE HEALTH INSURANCE

## 2017-06-12 VITALS — BP 124/79 | HR 95

## 2017-06-12 DIAGNOSIS — O10913 Unspecified pre-existing hypertension complicating pregnancy, third trimester: Secondary | ICD-10-CM

## 2017-06-12 DIAGNOSIS — Z3490 Encounter for supervision of normal pregnancy, unspecified, unspecified trimester: Secondary | ICD-10-CM

## 2017-06-12 NOTE — Progress Notes (Signed)
Patient for NST only visit.  Kathrene Alu RN BSN

## 2017-06-16 ENCOUNTER — Encounter: Payer: Self-pay | Admitting: Advanced Practice Midwife

## 2017-06-16 ENCOUNTER — Ambulatory Visit (INDEPENDENT_AMBULATORY_CARE_PROVIDER_SITE_OTHER): Payer: PRIVATE HEALTH INSURANCE | Admitting: Advanced Practice Midwife

## 2017-06-16 ENCOUNTER — Ambulatory Visit (HOSPITAL_COMMUNITY)
Admission: RE | Admit: 2017-06-16 | Discharge: 2017-06-16 | Disposition: A | Discharge status: Home / Self Care | Payer: PRIVATE HEALTH INSURANCE | Source: Ambulatory Visit | Attending: Family Medicine | Admitting: Family Medicine

## 2017-06-16 ENCOUNTER — Encounter (HOSPITAL_COMMUNITY): Payer: Self-pay

## 2017-06-16 ENCOUNTER — Other Ambulatory Visit: Payer: Self-pay | Admitting: Family Medicine

## 2017-06-16 VITALS — BP 131/75 | HR 87 | Wt 316.0 lb

## 2017-06-16 DIAGNOSIS — Z98891 History of uterine scar from previous surgery: Secondary | ICD-10-CM

## 2017-06-16 DIAGNOSIS — O99213 Obesity complicating pregnancy, third trimester: Secondary | ICD-10-CM

## 2017-06-16 DIAGNOSIS — O10919 Unspecified pre-existing hypertension complicating pregnancy, unspecified trimester: Secondary | ICD-10-CM

## 2017-06-16 DIAGNOSIS — Z3A36 36 weeks gestation of pregnancy: Secondary | ICD-10-CM | POA: Diagnosis not present

## 2017-06-16 DIAGNOSIS — O99210 Obesity complicating pregnancy, unspecified trimester: Secondary | ICD-10-CM

## 2017-06-16 DIAGNOSIS — O3440 Maternal care for other abnormalities of cervix, unspecified trimester: Secondary | ICD-10-CM

## 2017-06-16 DIAGNOSIS — O163 Unspecified maternal hypertension, third trimester: Secondary | ICD-10-CM

## 2017-06-16 DIAGNOSIS — O10013 Pre-existing essential hypertension complicating pregnancy, third trimester: Secondary | ICD-10-CM | POA: Insufficient documentation

## 2017-06-16 DIAGNOSIS — Z3480 Encounter for supervision of other normal pregnancy, unspecified trimester: Secondary | ICD-10-CM

## 2017-06-16 DIAGNOSIS — O26899 Other specified pregnancy related conditions, unspecified trimester: Secondary | ICD-10-CM

## 2017-06-16 DIAGNOSIS — Z6791 Unspecified blood type, Rh negative: Secondary | ICD-10-CM

## 2017-06-16 DIAGNOSIS — Z3490 Encounter for supervision of normal pregnancy, unspecified, unspecified trimester: Secondary | ICD-10-CM

## 2017-06-16 DIAGNOSIS — O34219 Maternal care for unspecified type scar from previous cesarean delivery: Secondary | ICD-10-CM

## 2017-06-16 DIAGNOSIS — Z113 Encounter for screening for infections with a predominantly sexual mode of transmission: Secondary | ICD-10-CM | POA: Diagnosis not present

## 2017-06-16 DIAGNOSIS — O98519 Other viral diseases complicating pregnancy, unspecified trimester: Secondary | ICD-10-CM

## 2017-06-16 DIAGNOSIS — B009 Herpesviral infection, unspecified: Secondary | ICD-10-CM

## 2017-06-16 DIAGNOSIS — Z87898 Personal history of other specified conditions: Secondary | ICD-10-CM

## 2017-06-16 LAB — OB RESULTS CONSOLE GC/CHLAMYDIA: Gonorrhea: NEGATIVE

## 2017-06-16 LAB — OB RESULTS CONSOLE GBS: STREP GROUP B AG: POSITIVE

## 2017-06-16 NOTE — Patient Instructions (Signed)

## 2017-06-16 NOTE — Progress Notes (Signed)
   PRENATAL VISIT NOTE  Subjective:  Courtney Mooney is a 31 y.o. G3P1011 at [redacted]w[redacted]d being seen today for ongoing prenatal care.  She is currently monitored for the following issues for this high-risk pregnancy and has Supervision of other normal pregnancy, antepartum; Previous cesarean delivery, antepartum; Cervical abnormality affecting pregnancy, antepartum; Chronic hypertension during pregnancy, antepartum; Maternal obesity, antepartum; Rh negative state in antepartum period; History of prediabetes; and Herpes simplex type 2 (HSV-2) infection affecting pregnancy, antepartum on their problem list.  Patient reports pelvic pressure.  Contractions: Irritability. Vag. Bleeding: None.  Movement: Present. Denies leaking of fluid.   The following portions of the patient's history were reviewed and updated as appropriate: allergies, current medications, past family history, past medical history, past social history, past surgical history and problem list. Problem list updated.  Objective:   Vitals:   06/16/17 0812  BP: 131/75  Pulse: 87  Weight: (!) 316 lb (143.3 kg)    Fetal Status: Fetal Heart Rate (bpm): 155   Movement: Present     General:  Alert, oriented and cooperative. Patient is in no acute distress.  Skin: Skin is warm and dry. No rash noted.   Cardiovascular: Normal heart rate noted  Respiratory: Normal respiratory effort, no problems with respiration noted  Abdomen: Soft, gravid, appropriate for gestational age.  Pain/Pressure: Present     Pelvic: Cervical exam performed        Cervix closed/80/-3/vtx  Extremities: Normal range of motion.  Edema: Trace  Mental Status:  Normal mood and affect. Normal behavior. Normal judgment and thought content.   Assessment and Plan:  Pregnancy: G3P1011 at [redacted]w[redacted]d  1. Prenatal care, antepartum      - GC/Chlamydia probe amp ([name])not at [facility] - Culture, beta strep (group b only)  2. Chronic hypertension during pregnancy, antepartum    Having US done today      Fundal height measures > dates, growth [place] today     Twice weekly testing  Preterm labor symptoms and general obstetric precautions including but not limited to vaginal bleeding, contractions, leaking of fluid and fetal movement were reviewed in detail with the patient. Please refer to After Visit Summary for other counseling recommendations.  Return in about 1 week (around 06/23/2017) for [employer].   Hansel Feinstein, CNM

## 2017-06-17 ENCOUNTER — Other Ambulatory Visit (HOSPITAL_COMMUNITY): Payer: Self-pay | Admitting: *Deleted

## 2017-06-17 DIAGNOSIS — O10919 Unspecified pre-existing hypertension complicating pregnancy, unspecified trimester: Secondary | ICD-10-CM

## 2017-06-19 LAB — GC/CHLAMYDIA PROBE AMP (~~LOC~~) NOT AT ARMC
Chlamydia: NEGATIVE
NEISSERIA GONORRHEA: NEGATIVE

## 2017-06-19 LAB — CULTURE, BETA STREP (GROUP B ONLY): STREP GP B CULTURE: POSITIVE — AB

## 2017-06-21 ENCOUNTER — Encounter: Payer: Self-pay | Admitting: Advanced Practice Midwife

## 2017-06-21 DIAGNOSIS — B951 Streptococcus, group B, as the cause of diseases classified elsewhere: Secondary | ICD-10-CM | POA: Insufficient documentation

## 2017-06-23 ENCOUNTER — Encounter (HOSPITAL_COMMUNITY): Payer: Self-pay

## 2017-06-23 ENCOUNTER — Ambulatory Visit (INDEPENDENT_AMBULATORY_CARE_PROVIDER_SITE_OTHER): Payer: PRIVATE HEALTH INSURANCE | Admitting: Advanced Practice Midwife

## 2017-06-23 ENCOUNTER — Other Ambulatory Visit (HOSPITAL_COMMUNITY): Payer: Self-pay | Admitting: Maternal and Fetal Medicine

## 2017-06-23 ENCOUNTER — Ambulatory Visit (HOSPITAL_COMMUNITY)
Admission: RE | Admit: 2017-06-23 | Discharge: 2017-06-23 | Disposition: A | Discharge status: Home / Self Care | Payer: PRIVATE HEALTH INSURANCE | Source: Ambulatory Visit | Attending: Advanced Practice Midwife | Admitting: Advanced Practice Midwife

## 2017-06-23 ENCOUNTER — Encounter: Payer: Self-pay | Admitting: Advanced Practice Midwife

## 2017-06-23 VITALS — BP 138/83 | HR 102 | Wt 310.0 lb

## 2017-06-23 DIAGNOSIS — O10913 Unspecified pre-existing hypertension complicating pregnancy, third trimester: Secondary | ICD-10-CM

## 2017-06-23 DIAGNOSIS — Z6841 Body Mass Index (BMI) 40.0 and over, adult: Secondary | ICD-10-CM | POA: Diagnosis not present

## 2017-06-23 DIAGNOSIS — B951 Streptococcus, group B, as the cause of diseases classified elsewhere: Secondary | ICD-10-CM

## 2017-06-23 DIAGNOSIS — B009 Herpesviral infection, unspecified: Secondary | ICD-10-CM

## 2017-06-23 DIAGNOSIS — Z3480 Encounter for supervision of other normal pregnancy, unspecified trimester: Secondary | ICD-10-CM

## 2017-06-23 DIAGNOSIS — O99213 Obesity complicating pregnancy, third trimester: Secondary | ICD-10-CM | POA: Insufficient documentation

## 2017-06-23 DIAGNOSIS — O10919 Unspecified pre-existing hypertension complicating pregnancy, unspecified trimester: Secondary | ICD-10-CM

## 2017-06-23 DIAGNOSIS — O34219 Maternal care for unspecified type scar from previous cesarean delivery: Secondary | ICD-10-CM | POA: Insufficient documentation

## 2017-06-23 DIAGNOSIS — Z87898 Personal history of other specified conditions: Secondary | ICD-10-CM

## 2017-06-23 DIAGNOSIS — O10013 Pre-existing essential hypertension complicating pregnancy, third trimester: Secondary | ICD-10-CM | POA: Diagnosis not present

## 2017-06-23 DIAGNOSIS — Z3A37 37 weeks gestation of pregnancy: Secondary | ICD-10-CM | POA: Diagnosis not present

## 2017-06-23 DIAGNOSIS — Z6791 Unspecified blood type, Rh negative: Secondary | ICD-10-CM

## 2017-06-23 DIAGNOSIS — O26899 Other specified pregnancy related conditions, unspecified trimester: Secondary | ICD-10-CM

## 2017-06-23 DIAGNOSIS — O98519 Other viral diseases complicating pregnancy, unspecified trimester: Secondary | ICD-10-CM

## 2017-06-23 DIAGNOSIS — O99210 Obesity complicating pregnancy, unspecified trimester: Secondary | ICD-10-CM

## 2017-06-23 DIAGNOSIS — O3440 Maternal care for other abnormalities of cervix, unspecified trimester: Secondary | ICD-10-CM

## 2017-06-23 NOTE — Patient Instructions (Signed)

## 2017-06-23 NOTE — Progress Notes (Addendum)
   PRENATAL VISIT NOTE  Subjective:  Courtney Mooney is a 31 y.o. G3P1011 at [redacted]w[redacted]d being seen today for ongoing prenatal care.  She is currently monitored for the following issues for this high-risk pregnancy and has Supervision of other normal pregnancy, antepartum; Previous cesarean delivery, antepartum; Cervical abnormality affecting pregnancy, antepartum; Chronic hypertension during pregnancy, antepartum; Maternal obesity, antepartum; Rh negative state in antepartum period; History of prediabetes; Herpes simplex type 2 (HSV-2) infection affecting pregnancy, antepartum; and Positive GBS test on their problem list.  Patient reports pelvic pressure  . Vag. Bleeding: None.  Movement: Present. Denies leaking of fluid.   The following portions of the patient's history were reviewed and updated as appropriate: allergies, current medications, past family history, past medical history, past social history, past surgical history and problem list. Problem list updated.  Objective:   Vitals:   06/23/17 0849  BP: 138/83  Pulse: (!) 102  Weight: (!) 310 lb (140.6 kg)    Fetal Status:     Movement: Present     General:  Alert, oriented and cooperative. Patient is in no acute distress.  Skin: Skin is warm and dry. No rash noted.   Cardiovascular: Normal heart rate noted  Respiratory: Normal respiratory effort, no problems with respiration noted  Abdomen: Soft, gravid, appropriate for gestational age.  Pain/Pressure: Present     Pelvic: Cervical exam deferred        Extremities: Normal range of motion.  Edema: Trace  Mental Status:  Normal mood and affect. Normal behavior. Normal judgment and thought content.   Assessment and Plan:  Pregnancy: G3P1011 at [redacted]w[redacted]d   1. Chronic hypertension during pregnancy, antepartum--Nml BP off meds - Growth [place] scheduled - [place] MFM FETAL BPP WO NON STRESS; Future     Per protocol, IOL should be at 40 wks since she is not on meds, and BPs are good  Discussed with pt.  Reviewed process of IOL      Will go ahead and schedule      Signs of preeclampsia reviewed in detail.  She needs to report any change and if develops preeclampsia would IOL now.  2. Supervision of other normal pregnancy, antepartum  3. Rh negative state in antepartum period    Got RhIG 04/20/17  4. Previous cesarean delivery, antepartum  - Planning TOLAC  5. History of prediabetes   6. Herpes simplex type 2 (HSV-2) infection affecting pregnancy, antepartum-No outbreak or prodrome  - valACYclovir (VALTREX) 500 MG tablet; Take 1 tablet (500 mg total) 2 (two) times daily by mouth.  Dispense: 30 tablet; Refill: 6  7. Cervical abnormality affecting pregnancy, antepartum   Term labor symptoms and general obstetric precautions including but not limited to vaginal bleeding, contractions, leaking of fluid and fetal movement were reviewed in detail with the patient. Please refer to After Visit Summary for other counseling recommendations.   RTO 1 week   Hansel Feinstein, CNM

## 2017-06-24 ENCOUNTER — Other Ambulatory Visit (HOSPITAL_COMMUNITY): Payer: Self-pay | Admitting: *Deleted

## 2017-06-24 DIAGNOSIS — O10913 Unspecified pre-existing hypertension complicating pregnancy, third trimester: Secondary | ICD-10-CM

## 2017-06-26 ENCOUNTER — Other Ambulatory Visit: Payer: PRIVATE HEALTH INSURANCE

## 2017-06-30 ENCOUNTER — Telehealth (HOSPITAL_COMMUNITY): Payer: Self-pay | Admitting: *Deleted

## 2017-06-30 ENCOUNTER — Ambulatory Visit (HOSPITAL_COMMUNITY)
Admission: RE | Admit: 2017-06-30 | Discharge: 2017-06-30 | Disposition: A | Discharge status: Home / Self Care | Payer: PRIVATE HEALTH INSURANCE | Source: Ambulatory Visit | Attending: Advanced Practice Midwife | Admitting: Advanced Practice Midwife

## 2017-06-30 ENCOUNTER — Encounter (HOSPITAL_COMMUNITY): Payer: Self-pay

## 2017-06-30 ENCOUNTER — Ambulatory Visit (INDEPENDENT_AMBULATORY_CARE_PROVIDER_SITE_OTHER): Payer: PRIVATE HEALTH INSURANCE | Admitting: Advanced Practice Midwife

## 2017-06-30 VITALS — BP 128/76 | HR 89 | Wt 316.0 lb

## 2017-06-30 DIAGNOSIS — O10913 Unspecified pre-existing hypertension complicating pregnancy, third trimester: Secondary | ICD-10-CM

## 2017-06-30 DIAGNOSIS — Z3A38 38 weeks gestation of pregnancy: Secondary | ICD-10-CM | POA: Insufficient documentation

## 2017-06-30 DIAGNOSIS — Z6791 Unspecified blood type, Rh negative: Secondary | ICD-10-CM

## 2017-06-30 DIAGNOSIS — O34219 Maternal care for unspecified type scar from previous cesarean delivery: Secondary | ICD-10-CM

## 2017-06-30 DIAGNOSIS — O99210 Obesity complicating pregnancy, unspecified trimester: Secondary | ICD-10-CM

## 2017-06-30 DIAGNOSIS — O98519 Other viral diseases complicating pregnancy, unspecified trimester: Secondary | ICD-10-CM

## 2017-06-30 DIAGNOSIS — Z87898 Personal history of other specified conditions: Secondary | ICD-10-CM

## 2017-06-30 DIAGNOSIS — O26899 Other specified pregnancy related conditions, unspecified trimester: Secondary | ICD-10-CM

## 2017-06-30 DIAGNOSIS — O10919 Unspecified pre-existing hypertension complicating pregnancy, unspecified trimester: Secondary | ICD-10-CM

## 2017-06-30 DIAGNOSIS — O3440 Maternal care for other abnormalities of cervix, unspecified trimester: Secondary | ICD-10-CM

## 2017-06-30 DIAGNOSIS — Z3480 Encounter for supervision of other normal pregnancy, unspecified trimester: Secondary | ICD-10-CM

## 2017-06-30 DIAGNOSIS — B951 Streptococcus, group B, as the cause of diseases classified elsewhere: Secondary | ICD-10-CM

## 2017-06-30 DIAGNOSIS — B009 Herpesviral infection, unspecified: Secondary | ICD-10-CM

## 2017-06-30 NOTE — Telephone Encounter (Signed)
Preadmission screen  

## 2017-06-30 NOTE — Progress Notes (Signed)
   PRENATAL VISIT NOTE  Subjective:  Courtney Mooney is a 31 y.o. G3P1011 at [redacted]w[redacted]d being seen today for ongoing prenatal care.  She is currently monitored for the following issues for this high-risk pregnancy and has Supervision of other normal pregnancy, antepartum; Previous cesarean delivery, antepartum; Cervical abnormality affecting pregnancy, antepartum; Chronic hypertension during pregnancy, antepartum; Maternal obesity, antepartum; Rh negative state in antepartum period; History of prediabetes; Herpes simplex type 2 (HSV-2) infection affecting pregnancy, antepartum; and Positive GBS test on their problem list.  Patient reports occasional contractions.  Contractions: Irregular. Vag. Bleeding: None.  Movement: Present. Denies leaking of fluid.   The following portions of the patient's history were reviewed and updated as appropriate: allergies, current medications, past family history, past medical history, past social history, past surgical history and problem list. Problem list updated.  Objective:   Vitals:   06/30/17 0845  BP: 128/76  Pulse: 89  Weight: (!) 316 lb (143.3 kg)    Fetal Status:     Movement: Present     General:  Alert, oriented and cooperative. Patient is in no acute distress.  Skin: Skin is warm and dry. No rash noted.   Cardiovascular: Normal heart rate noted  Respiratory: Normal respiratory effort, no problems with respiration noted  Abdomen: Soft, gravid, appropriate for gestational age.  Pain/Pressure: Present     Pelvic: Cervical exam performed       Cervix closed/80%/-3/vtx  Extremities: Normal range of motion.  Edema: Trace  Mental Status:  Normal mood and affect. Normal behavior. Normal judgment and thought content.   Assessment and Plan:  Pregnancy: G3P1011 at [redacted]w[redacted]d                       Chronic HTN, good control, no meds                       Prior C/S, desires TOLAC  Term labor symptoms and general obstetric precautions including but not  limited to vaginal bleeding, contractions, leaking of fluid and fetal movement were reviewed in detail with the patient. Please refer to After Visit Summary for other counseling recommendations.   Scheduled for BPP/measurements today Message sent to Dr Burnett Harry re: plan for IOL.  Pt states she recommended IOL at 39 wks, but our protocol says 40 wks.  Dr Kennon Rounds recommends 40wks since pt is TOLAC.   Labor precautions   Hansel Feinstein, CNM

## 2017-06-30 NOTE — Progress Notes (Signed)
Patient is doing weekly BPPs. Patient states that MFM mentioned to her induction next week- would like to discuss plan of care today. Kathrene Alu RNBSN

## 2017-06-30 NOTE — Patient Instructions (Signed)

## 2017-07-01 ENCOUNTER — Other Ambulatory Visit (HOSPITAL_COMMUNITY): Payer: Self-pay | Admitting: *Deleted

## 2017-07-01 DIAGNOSIS — O10919 Unspecified pre-existing hypertension complicating pregnancy, unspecified trimester: Secondary | ICD-10-CM

## 2017-07-03 ENCOUNTER — Other Ambulatory Visit: Payer: PRIVATE HEALTH INSURANCE

## 2017-07-07 ENCOUNTER — Encounter: Payer: PRIVATE HEALTH INSURANCE | Admitting: Advanced Practice Midwife

## 2017-07-07 ENCOUNTER — Other Ambulatory Visit: Payer: Self-pay | Admitting: Advanced Practice Midwife

## 2017-07-08 ENCOUNTER — Ambulatory Visit (INDEPENDENT_AMBULATORY_CARE_PROVIDER_SITE_OTHER): Payer: PRIVATE HEALTH INSURANCE | Admitting: Obstetrics & Gynecology

## 2017-07-08 ENCOUNTER — Other Ambulatory Visit (HOSPITAL_COMMUNITY): Payer: Self-pay | Admitting: Maternal and Fetal Medicine

## 2017-07-08 ENCOUNTER — Other Ambulatory Visit: Payer: Self-pay | Admitting: Obstetrics and Gynecology

## 2017-07-08 ENCOUNTER — Ambulatory Visit (HOSPITAL_COMMUNITY)
Admission: RE | Admit: 2017-07-08 | Discharge: 2017-07-08 | Disposition: A | Discharge status: Home / Self Care | Payer: PRIVATE HEALTH INSURANCE | Source: Ambulatory Visit | Attending: Advanced Practice Midwife | Admitting: Advanced Practice Midwife

## 2017-07-08 ENCOUNTER — Encounter (HOSPITAL_COMMUNITY): Payer: Self-pay

## 2017-07-08 VITALS — BP 128/87 | HR 87 | Wt 314.0 lb

## 2017-07-08 DIAGNOSIS — B951 Streptococcus, group B, as the cause of diseases classified elsewhere: Secondary | ICD-10-CM

## 2017-07-08 DIAGNOSIS — O98519 Other viral diseases complicating pregnancy, unspecified trimester: Secondary | ICD-10-CM

## 2017-07-08 DIAGNOSIS — Z3A39 39 weeks gestation of pregnancy: Secondary | ICD-10-CM

## 2017-07-08 DIAGNOSIS — Z87898 Personal history of other specified conditions: Secondary | ICD-10-CM

## 2017-07-08 DIAGNOSIS — O99210 Obesity complicating pregnancy, unspecified trimester: Secondary | ICD-10-CM

## 2017-07-08 DIAGNOSIS — Z6841 Body Mass Index (BMI) 40.0 and over, adult: Secondary | ICD-10-CM | POA: Diagnosis not present

## 2017-07-08 DIAGNOSIS — Z3480 Encounter for supervision of other normal pregnancy, unspecified trimester: Secondary | ICD-10-CM

## 2017-07-08 DIAGNOSIS — Z362 Encounter for other antenatal screening follow-up: Secondary | ICD-10-CM | POA: Insufficient documentation

## 2017-07-08 DIAGNOSIS — Z6791 Unspecified blood type, Rh negative: Secondary | ICD-10-CM

## 2017-07-08 DIAGNOSIS — O10919 Unspecified pre-existing hypertension complicating pregnancy, unspecified trimester: Secondary | ICD-10-CM

## 2017-07-08 DIAGNOSIS — O34219 Maternal care for unspecified type scar from previous cesarean delivery: Secondary | ICD-10-CM

## 2017-07-08 DIAGNOSIS — O09899 Supervision of other high risk pregnancies, unspecified trimester: Secondary | ICD-10-CM

## 2017-07-08 DIAGNOSIS — O26899 Other specified pregnancy related conditions, unspecified trimester: Secondary | ICD-10-CM

## 2017-07-08 DIAGNOSIS — O99213 Obesity complicating pregnancy, third trimester: Secondary | ICD-10-CM | POA: Insufficient documentation

## 2017-07-08 DIAGNOSIS — B009 Herpesviral infection, unspecified: Secondary | ICD-10-CM

## 2017-07-08 DIAGNOSIS — O3440 Maternal care for other abnormalities of cervix, unspecified trimester: Secondary | ICD-10-CM

## 2017-07-08 DIAGNOSIS — O10013 Pre-existing essential hypertension complicating pregnancy, third trimester: Secondary | ICD-10-CM | POA: Diagnosis not present

## 2017-07-08 NOTE — Progress Notes (Signed)
   PRENATAL VISIT NOTE  Subjective:  Courtney Mooney is a 31 y.o. G3P1011 at 103w1d being seen today for ongoing prenatal care.  She is currently monitored for the following issues for this high-risk pregnancy and has Supervision of other normal pregnancy, antepartum; Previous cesarean delivery, antepartum; Cervical abnormality affecting pregnancy, antepartum; Chronic hypertension during pregnancy, antepartum; Maternal obesity, antepartum; Rh negative state in antepartum period; History of prediabetes; Herpes simplex type 2 (HSV-2) infection affecting pregnancy, antepartum; and Positive GBS test on their problem list.  Patient reports no complaints.  Contractions: Irregular. Vag. Bleeding: None.  Movement: Present. Denies leaking of fluid.   The following portions of the patient's history were reviewed and updated as appropriate: allergies, current medications, past family history, past medical history, past social history, past surgical history and problem list. Problem list updated.  Objective:   Vitals:   07/08/17 1447  BP: 128/87  Pulse: 87  Weight: (!) 314 lb (142.4 kg)    Fetal Status: Fetal Heart Rate (bpm): 145   Movement: Present     General:  Alert, oriented and cooperative. Patient is in no acute distress.  Skin: Skin is warm and dry. No rash noted.   Cardiovascular: Normal heart rate noted  Respiratory: Normal respiratory effort, no problems with respiration noted  Abdomen: Soft, gravid, appropriate for gestational age.  Pain/Pressure: Present     Pelvic: Cervical exam deferred        Extremities: Normal range of motion.     Mental Status:  Normal mood and affect. Normal behavior. Normal judgment and thought content.   Assessment and Plan:  Pregnancy: G3P1011 at [redacted]w[redacted]d  1. Supervision of other normal pregnancy, antepartum For IOL 07/14/2017  2. Rh negative state in antepartum period Rhogam after del  3. Previous cesarean delivery, antepartum Desires VBAC. VBAC  consent signed and on chart frm 04/09/2017  4. Positive GBS test Needs ATBX in labor  5. Maternal obesity, antepartum  6. History of prediabetes  7. Herpes simplex type 2 (HSV-2) infection affecting pregnancy, antepartum On suppression  8. Chronic hypertension during pregnancy, antepartum Stable off meds For IOL 07/14/2017  9. Cervical abnormality affecting pregnancy, antepartum  Term labor symptoms and general obstetric precautions including but not limited to vaginal bleeding, contractions, leaking of fluid and fetal movement were reviewed in detail with the patient. Please refer to After Visit Summary for other counseling recommendations.  F/u for PP care  Lavonia Drafts, MD

## 2017-07-08 NOTE — Patient Instructions (Signed)
Labor Induction Labor induction is when steps are taken to cause a pregnant woman to begin the labor process. Most women go into labor on their own between 37 weeks and 42 weeks of the pregnancy. When this does not happen or when there is a medical need, methods may be used to induce labor. Labor induction causes a pregnant woman's uterus to contract. It also causes the cervix to soften (ripen), open (dilate), and thin out (efface). Usually, labor is not induced before 39 weeks of the pregnancy unless there is a problem with the baby or mother. Before inducing labor, your health care provider will consider a number of factors, including the following:  The medical condition of you and the baby.  How many weeks along you are.  The status of the baby's lung maturity.  The condition of the cervix.  The position of the baby. What are the reasons for labor induction? Labor may be induced for the following reasons:  The health of the baby or mother is at risk.  The pregnancy is overdue by 1 week or more.  The water breaks but labor does not start on its own.  The mother has a health condition or serious illness, such as high blood pressure, infection, placental abruption, or diabetes.  The amniotic fluid amounts are low around the baby.  The baby is distressed. Convenience or wanting the baby to be born on a certain date is not a reason for inducing labor. What methods are used for labor induction? Several methods of labor induction may be used, such as:  Prostaglandin medicine. This medicine causes the cervix to dilate and ripen. The medicine will also start contractions. It can be taken by mouth or by inserting a suppository into the vagina.  Inserting a thin tube (catheter) with a balloon on the end into the vagina to dilate the cervix. Once inserted, the balloon is expanded with water, which causes the cervix to open.  Stripping the membranes. Your health care provider separates  amniotic sac tissue from the cervix, causing the cervix to be stretched and causing the release of a hormone called progesterone. This may cause the uterus to contract. It is often done during an office visit. You will be sent home to wait for the contractions to begin. You will then come in for an induction.  Breaking the water. Your health care provider makes a hole in the amniotic sac using a small instrument. Once the amniotic sac breaks, contractions should begin. This may still take hours to see an effect.  Medicine to trigger or strengthen contractions. This medicine is given through an IV access tube inserted into a vein in your arm. All of the methods of induction, besides stripping the membranes, will be done in the hospital. Induction is done in the hospital so that you and the baby can be carefully monitored. How long does it take for labor to be induced? Some inductions can take up to 2-3 days. Depending on the cervix, it usually takes less time. It takes longer when you are induced early in the pregnancy or if this is your first pregnancy. If a mother is still pregnant and the induction has been going on for 2-3 days, either the mother will be sent home or a cesarean delivery will be needed. What are the risks associated with labor induction? Some of the risks of induction include:  Changes in fetal heart rate, such as too high, too low, or erratic.  Fetal distress.    Chance of infection for the mother and baby.  Increased chance of having a cesarean delivery.  Breaking off (abruption) of the placenta from the uterus (rare).  Uterine rupture (very rare). When induction is needed for medical reasons, the benefits of induction may outweigh the risks. What are some reasons for not inducing labor? Labor induction should not be done if:  It is shown that your baby does not tolerate labor.  You have had previous surgeries on your uterus, such as a myomectomy or the removal of  fibroids.  Your placenta lies very low in the uterus and blocks the opening of the cervix (placenta previa).  Your baby is not in a head-down position.  The umbilical cord drops down into the birth canal in front of the baby. This could cut off the baby's blood and oxygen supply.  You have had a previous cesarean delivery.  There are unusual circumstances, such as the baby being extremely premature. This information is not intended to replace advice given to you by your health care provider. Make sure you discuss any questions you have with your health care provider. Document Released: 12/03/2006 Document Revised: 12/20/2015 Document Reviewed: 02/10/2013 Elsevier Interactive Patient Education  2017 Elsevier Inc.  

## 2017-07-10 ENCOUNTER — Other Ambulatory Visit: Payer: PRIVATE HEALTH INSURANCE

## 2017-07-14 ENCOUNTER — Inpatient Hospital Stay (HOSPITAL_COMMUNITY)
Admission: RE | Admit: 2017-07-14 | Discharge: 2017-07-17 | DRG: 787 | Disposition: A | Discharge status: Home / Self Care | Payer: PRIVATE HEALTH INSURANCE | Source: Ambulatory Visit | Attending: Obstetrics and Gynecology | Admitting: Obstetrics and Gynecology

## 2017-07-14 ENCOUNTER — Encounter (HOSPITAL_COMMUNITY): Payer: Self-pay

## 2017-07-14 DIAGNOSIS — O9832 Other infections with a predominantly sexual mode of transmission complicating childbirth: Secondary | ICD-10-CM | POA: Diagnosis present

## 2017-07-14 DIAGNOSIS — Z6791 Unspecified blood type, Rh negative: Secondary | ICD-10-CM

## 2017-07-14 DIAGNOSIS — O99824 Streptococcus B carrier state complicating childbirth: Secondary | ICD-10-CM | POA: Diagnosis present

## 2017-07-14 DIAGNOSIS — O98519 Other viral diseases complicating pregnancy, unspecified trimester: Secondary | ICD-10-CM

## 2017-07-14 DIAGNOSIS — Z7982 Long term (current) use of aspirin: Secondary | ICD-10-CM | POA: Diagnosis not present

## 2017-07-14 DIAGNOSIS — O26899 Other specified pregnancy related conditions, unspecified trimester: Secondary | ICD-10-CM

## 2017-07-14 DIAGNOSIS — O34211 Maternal care for low transverse scar from previous cesarean delivery: Secondary | ICD-10-CM | POA: Diagnosis present

## 2017-07-14 DIAGNOSIS — Z531 Procedure and treatment not carried out because of patient's decision for reasons of belief and group pressure: Secondary | ICD-10-CM

## 2017-07-14 DIAGNOSIS — O10919 Unspecified pre-existing hypertension complicating pregnancy, unspecified trimester: Secondary | ICD-10-CM

## 2017-07-14 DIAGNOSIS — O26893 Other specified pregnancy related conditions, third trimester: Secondary | ICD-10-CM | POA: Diagnosis present

## 2017-07-14 DIAGNOSIS — A6000 Herpesviral infection of urogenital system, unspecified: Secondary | ICD-10-CM | POA: Diagnosis present

## 2017-07-14 DIAGNOSIS — Z130 Encounter for screening for diseases of the blood and blood-forming organs and certain disorders involving the immune mechanism: Secondary | ICD-10-CM | POA: Diagnosis present

## 2017-07-14 DIAGNOSIS — Z87898 Personal history of other specified conditions: Secondary | ICD-10-CM

## 2017-07-14 DIAGNOSIS — Z302 Encounter for sterilization: Secondary | ICD-10-CM | POA: Diagnosis present

## 2017-07-14 DIAGNOSIS — Z3A40 40 weeks gestation of pregnancy: Secondary | ICD-10-CM

## 2017-07-14 DIAGNOSIS — O99214 Obesity complicating childbirth: Secondary | ICD-10-CM | POA: Diagnosis present

## 2017-07-14 DIAGNOSIS — Z13220 Encounter for screening for lipoid disorders: Secondary | ICD-10-CM | POA: Diagnosis present

## 2017-07-14 DIAGNOSIS — B009 Herpesviral infection, unspecified: Secondary | ICD-10-CM

## 2017-07-14 DIAGNOSIS — Z3490 Encounter for supervision of normal pregnancy, unspecified, unspecified trimester: Secondary | ICD-10-CM

## 2017-07-14 DIAGNOSIS — O1002 Pre-existing essential hypertension complicating childbirth: Principal | ICD-10-CM | POA: Diagnosis present

## 2017-07-14 DIAGNOSIS — O34219 Maternal care for unspecified type scar from previous cesarean delivery: Secondary | ICD-10-CM

## 2017-07-14 DIAGNOSIS — O99210 Obesity complicating pregnancy, unspecified trimester: Secondary | ICD-10-CM

## 2017-07-14 DIAGNOSIS — Z0000 Encounter for general adult medical examination without abnormal findings: Secondary | ICD-10-CM | POA: Diagnosis present

## 2017-07-14 DIAGNOSIS — O3440 Maternal care for other abnormalities of cervix, unspecified trimester: Secondary | ICD-10-CM

## 2017-07-14 DIAGNOSIS — B951 Streptococcus, group B, as the cause of diseases classified elsewhere: Secondary | ICD-10-CM

## 2017-07-14 LAB — CBC
HEMATOCRIT: 35.9 % — AB (ref 36.0–46.0)
HEMOGLOBIN: 11.5 g/dL — AB (ref 12.0–15.0)
MCH: 28.3 pg (ref 26.0–34.0)
MCHC: 32 g/dL (ref 30.0–36.0)
MCV: 88.2 fL (ref 78.0–100.0)
Platelets: 311 10*3/uL (ref 150–400)
RBC: 4.07 MIL/uL (ref 3.87–5.11)
RDW: 13.9 % (ref 11.5–15.5)
WBC: 11.9 10*3/uL — ABNORMAL HIGH (ref 4.0–10.5)

## 2017-07-14 LAB — PROTEIN / CREATININE RATIO, URINE
Creatinine, Urine: 192 mg/dL
Protein Creatinine Ratio: 0.1 mg/mg{Cre} (ref 0.00–0.15)
TOTAL PROTEIN, URINE: 20 mg/dL

## 2017-07-14 LAB — RPR: RPR Ser Ql: NONREACTIVE

## 2017-07-14 MED ORDER — LACTATED RINGERS IV SOLN
INTRAVENOUS | Status: DC
  Administered 2017-07-14 – 2017-07-15 (×4): via INTRAVENOUS

## 2017-07-14 MED ORDER — LACTATED RINGERS IV SOLN: 500.0000 mL | INTRAVENOUS | Status: DC | PRN

## 2017-07-14 MED ORDER — PENICILLIN G POT IN DEXTROSE 60000 UNIT/ML IV SOLN
3.0000 10*6.[IU] | INTRAVENOUS | Status: DC
  Administered 2017-07-14 – 2017-07-15 (×7): 3 10*6.[IU] via INTRAVENOUS
  Filled 2017-07-14 (×10): qty 50

## 2017-07-14 MED ORDER — FLEET ENEMA 7-19 GM/118ML RE ENEM: 1.0000 | ENEMA | RECTAL | Status: DC | PRN

## 2017-07-14 MED ORDER — DEXTROSE 5 % IV SOLN
2.5000 10*6.[IU] | INTRAVENOUS | Status: DC
  Filled 2017-07-14 (×2): qty 2.5

## 2017-07-14 MED ORDER — OXYTOCIN 40 UNITS IN LACTATED RINGERS INFUSION - SIMPLE MED: 2.5000 [IU]/h | INTRAVENOUS | Status: DC

## 2017-07-14 MED ORDER — SOD CITRATE-CITRIC ACID 500-334 MG/5ML PO SOLN
30.0000 mL | ORAL | Status: DC | PRN
Start: 2017-07-14 — End: 2017-07-15
  Administered 2017-07-15: 30 mL via ORAL
  Filled 2017-07-14: qty 15

## 2017-07-14 MED ORDER — OXYCODONE-ACETAMINOPHEN 5-325 MG PO TABS: 1.0000 | ORAL_TABLET | ORAL | Status: DC | PRN

## 2017-07-14 MED ORDER — OXYCODONE-ACETAMINOPHEN 5-325 MG PO TABS: 2.0000 | ORAL_TABLET | ORAL | Status: DC | PRN

## 2017-07-14 MED ORDER — PENICILLIN G POT IN DEXTROSE 60000 UNIT/ML IV SOLN
3.0000 10*6.[IU] | INTRAVENOUS | Status: DC
  Filled 2017-07-14 (×2): qty 50

## 2017-07-14 MED ORDER — DEXTROSE 5 % IV SOLN
5.0000 10*6.[IU] | Freq: Once | INTRAVENOUS | Status: AC
  Administered 2017-07-14: 5 10*6.[IU] via INTRAVENOUS
  Filled 2017-07-14: qty 5

## 2017-07-14 MED ORDER — LABETALOL HCL 5 MG/ML IV SOLN: 20.0000 mg | INTRAVENOUS | Status: DC | PRN

## 2017-07-14 MED ORDER — LIDOCAINE HCL (PF) 1 % IJ SOLN: 30.0000 mL | INTRAMUSCULAR | Status: DC | PRN

## 2017-07-14 MED ORDER — OXYTOCIN BOLUS FROM INFUSION: 500.0000 mL | Freq: Once | INTRAVENOUS | Status: DC

## 2017-07-14 MED ORDER — ONDANSETRON HCL 4 MG/2ML IJ SOLN: 4.0000 mg | Freq: Four times a day (QID) | INTRAMUSCULAR | Status: DC | PRN

## 2017-07-14 MED ORDER — ACETAMINOPHEN 325 MG PO TABS: 650.0000 mg | ORAL_TABLET | ORAL | Status: DC | PRN

## 2017-07-14 MED ORDER — HYDRALAZINE HCL 20 MG/ML IJ SOLN: 10.0000 mg | Freq: Once | INTRAMUSCULAR | Status: DC | PRN

## 2017-07-14 MED ORDER — OXYTOCIN 40 UNITS IN LACTATED RINGERS INFUSION - SIMPLE MED
1.0000 m[IU]/min | INTRAVENOUS | Status: DC
  Administered 2017-07-14: 10 m[IU]/min via INTRAVENOUS
  Administered 2017-07-14 – 2017-07-15 (×2): 2 m[IU]/min via INTRAVENOUS
  Filled 2017-07-14 (×2): qty 1000

## 2017-07-14 MED ORDER — VALACYCLOVIR HCL 500 MG PO TABS
1000.0000 mg | ORAL_TABLET | Freq: Two times a day (BID) | ORAL | Status: DC
  Administered 2017-07-14 – 2017-07-15 (×2): 1000 mg via ORAL
  Filled 2017-07-14 (×4): qty 2

## 2017-07-14 MED ORDER — TERBUTALINE SULFATE 1 MG/ML IJ SOLN
0.2500 mg | Freq: Once | INTRAMUSCULAR | Status: DC | PRN
  Filled 2017-07-14: qty 1

## 2017-07-14 NOTE — Anesthesia Pain Management Evaluation Note (Signed)
  CRNA Pain Management Visit Note  Patient: Courtney Mooney, 31 y.o., female  "Hello I am a member of the anesthesia team at [facility]. We have an anesthesia team available at all times to provide care throughout the hospital, including epidural management and anesthesia for C-section. I don't know your plan for the delivery whether it a natural birth, water birth, IV sedation, nitrous supplementation, doula or epidural, but we want to meet your pain goals."   1.Was your pain managed to your expectations on prior hospitalizations?   Yes   2.What is your expectation for pain management during this hospitalization?     Epidural  3.How can we help you reach that goal? unsure  Record the patient's initial score and the patient's pain goal.   Pain: 0  Pain Goal: 7 [facility] wants you to be able to say your pain was always managed very well.  Casimer Lanius 07/14/2017

## 2017-07-14 NOTE — Progress Notes (Signed)
   Courtney Mooney is a 31 y.o. G3P1011 at [redacted]w[redacted]d  admitted for IOL for [facility].   Patient doing well; no complaints. SHe denies any s/s of HSV2 outbreaks; has not had an outbreak in this pregnancy and has been on suppression.   Patient again refused a Type and Screen and signed refusal of blood products. I explained that my recommendation is taht she have a Type and Screen drawn in case she changes her mind; patient is adament that she does not want Type and Screen and will not accept blood products. She verbalized that she understands that risks. Husband at the bedside and in agreement.   Patient still wants to [facility].   Objective: Vitals:   07/14/17 0740 07/14/17 0759 07/14/17 0859  BP: (!) 137/96    Pulse: (!) 103    Resp: 18  18  Temp: 97.6 F (36.4 C)    TempSrc: Oral Oral   Weight:  (!) 314 lb (142.4 kg)   Height:  5\' 5"  (1.651 m)    No intake/output data recorded.  FHT:  FHR: 135 bpm, variability: moderate,  accelerations:  Present,  decelerations:  Absent UC:   Uterine irratability.  SVE:   Dilation: Closed Exam by:: Maye Hides, CNM Pitocin @ 2 mu/min  Examined labia majora and minora; no evidence of lesions. One small condyloma noted on clitoral hood.  Labs: Lab Results  Component Value Date   WBC 11.9 (H) 07/14/2017   HGB 11.5 (L) 07/14/2017   HCT 35.9 (L) 07/14/2017   MCV 88.2 07/14/2017   PLT 311 07/14/2017    Assessment / Plan: IOL for CHTN. Patient still wants to [facility]. No signs of HSV-2. Pressures are stable at 140/90s.  Will start low dose pitocin.   Labor: Will start low dose pitocin and GBS prophlaxis.  Fetal Wellbeing:  Category I Pain Control:  Labor support without medications Anticipated MOD:  NSVD  Starr Lake 07/14/2017, 9:39 AM

## 2017-07-14 NOTE — Progress Notes (Signed)
Patient ID: Courtney Mooney, female   DOB: September 08, 1985, 31 y.o.   MRN: [id number]  Courtney Mooney is a 31 y.o. G3P1011 at [redacted]w[redacted]d admitted for induction of labor due to [facility], also [place].  Subjective: Feeling uc's, not uncomfortable  Objective: BP (!) 151/96   Pulse 88   Temp 98.3 F (36.8 C) (Oral)   Resp 18   Ht 5\' 5"  (1.651 m)   Wt (!) 142.4 kg (314 lb)   LMP 10/07/2016   BMI 52.25 kg/m  No intake/output data recorded.  FHT:  FHR: 150 bpm, variability: moderate,  accelerations:  Present,  decelerations:  Absent UC:   regular, every 2-3 minutes  SVE:   Dilation: Fingertip Effacement (%): 80 Station: -2 Exam by:: Knute Neu, CNM  cx is a dimple, unable to get fingertip all the way through, attempted foley bulb insertion by hand w/o success. Also attempted via speculum w/o success  Pitocin @ 18 mu/min  Labs: Lab Results  Component Value Date   WBC 11.9 (H) 07/14/2017   HGB 11.5 (L) 07/14/2017   HCT 35.9 (L) 07/14/2017   MCV 88.2 07/14/2017   PLT 311 07/14/2017    Assessment / Plan: IOL d/t CHTN, TOLAC, currently on pitocin @ 22mu/min, contracting regularly. Unable to get cervical foley bulb in at this time after 2 attempts. Will reassess cx at 0200. Updated Dr. Nehemiah Settle  Labor: ripening phase Fetal Wellbeing:  Category I Pain Control:  Labor support without medications Pre-eclampsia: n/a I/D:  PCN for GBS+ Anticipated MOD:  VBAC  Tawnya Crook CNM, WHNP-BC 07/14/2017, 10:08 PM

## 2017-07-14 NOTE — Progress Notes (Signed)
Courtney Mooney is a 31 y.o. G3P1011 at [redacted]w[redacted]d admitted for induction of labor due to [place].  Subjective: Patient is doing well, no complaints. Feels increased frequency and pressure with contractions, not requiring pain medication at this point.  Objective: BP (!) 145/76   Pulse 91   Temp 98.2 F (36.8 C) (Oral)   Resp 20   Ht 5\' 5"  (1.651 m)   Wt (!) 142.4 kg (314 lb)   LMP 10/07/2016   BMI 52.25 kg/m  No intake/output data recorded. No intake/output data recorded.  FHT:  FHR: 135 bpm, variability: moderate,  accelerations:  Present,  decelerations:  Absent UC:   Irreg q1-4 SVE:   Dilation: Closed Exam by:: Maye Hides, CNM  Labs: Lab Results  Component Value Date   WBC 11.9 (H) 07/14/2017   HGB 11.5 (L) 07/14/2017   HCT 35.9 (L) 07/14/2017   MCV 88.2 07/14/2017   PLT 311 07/14/2017    Assessment / Plan: IOL for cHTN. On Pitocin  Labor: Continue pitocin Fetal Wellbeing:  Category I Pain Control:  Per patient request I/D:  GBS pos Anticipated MOD:  TOLAC SVD  Audery Amel 07/14/2017, 2:50 PM   I confirm that I have verified the information documented in the PA student's note and that I have also personally reperformed the physical exam and all medical decision making activities.   Maye Hides

## 2017-07-14 NOTE — H&P (Signed)
Courtney Mooney is a 31 y.o. female G3P1011 with IUP at [redacted]w[redacted]d presenting for IOL for [place]. Pt states she has been having no contractions, associated with none vaginal bleeding. Membranes are intact, with active fetal movement.   PNCare at [facility] since 7 wks.   Prenatal History/Complications: - TOLAC - HSV on chronic suppression since 36 weeks - cHTN - labetolol discontinued - [employer] - agrees to TXA - RH negative - RhoGAM given 05/04/17 - GBS positive   Past Medical History: Past Medical History:  Diagnosis Date  . Anemia   . HSV-2 (herpes simplex virus 2) infection   . Hypertension   . PCOS (polycystic ovarian syndrome)   . Vaginal Pap smear, abnormal    Past Surgical History: Past Surgical History:  Procedure Laterality Date  . CESAREAN SECTION    . CHOLECYSTECTOMY     Obstetrical History: OB History    Gravida Para Term Preterm AB Living   3 1 1   1 1    SAB TAB Ectopic Multiple Live Births   1       1     Social History: Social History   Socioeconomic History  . Marital status: Married    Spouse name: None  . Number of children: None  . Years of education: None  . Highest education level: None  Social Needs  . Financial resource strain: None  . Food insecurity - worry: None  . Food insecurity - inability: None  . Transportation needs - medical: None  . Transportation needs - non-medical: None  Occupational History  . None  Tobacco Use  . Smoking status: Never Smoker  . Smokeless tobacco: Never Used  Substance and Sexual Activity  . Alcohol use: No  . Drug use: No  . Sexual activity: Yes    Birth control/protection: None  Other Topics Concern  . None  Social History Narrative  . None   Family History: Family History  Problem Relation Age of Onset  . Diabetes Father   . Colon cancer Maternal Grandmother   . Diabetes Maternal Grandmother   . Deep vein thrombosis Maternal Grandmother   . Pulmonary embolism Maternal Grandmother     Allergies: No Known Allergies  Facility-Administered Medications Prior to Admission  Medication Dose Route Frequency Provider Last Rate Last Dose  . aspirin chewable tablet 81 mg  81 mg Oral Daily Lavonia Drafts, MD      . Rho D Immune Globulin SOSY 1,500 Units  1,500 Units Intramuscular Once Lavonia Drafts, MD       Medications Prior to Admission  Medication Sig Dispense Refill Last Dose  . Prenatal Vit-Fe Fumarate-FA (PREPLUS) 27-1 MG TABS Take 1 tablet by mouth daily. 30 tablet 13 07/13/2017 at Unknown time  . valACYclovir (VALTREX) 500 MG tablet Take 1 tablet (500 mg total) 2 (two) times daily by mouth. 30 tablet 6 07/13/2017 at Unknown time  . aspirin EC 81 MG tablet Take 81 mg by mouth daily.   07/07/2017  . glucose blood test strip Use as instructed for testing four times daily. 100 each 12 Taking  . imiquimod (ALDARA) 5 % cream Apply topically 3 (three) times a week. Apply until total clearance or maximum of 16 weeks 24 each 5 07/12/2017  . Lancets (ONETOUCH ULTRASOFT) lancets Use as instructed 100 each 12 Taking  . Loratadine (CLARITIN PO) Take 1 tablet by mouth daily.    07/12/2017  . ondansetron (ZOFRAN-ODT) 4 MG disintegrating tablet Take by mouth.   Not Taking  Review of Systems  Constitutional: Negative for fever and chills Eyes: Negative for visual disturbances Respiratory: Negative for shortness of breath, dyspnea Cardiovascular: Negative for chest pain or palpitations  Gastrointestinal: Negative for vomiting, diarrhea and constipation.  POSITIVE for abdominal cramps (uterine irritability) Genitourinary: Negative for dysuria and urgency Musculoskeletal: Negative for back pain, joint pain, myalgias  Neurological: Negative for dizziness and headaches   Blood pressure (!) 137/96, pulse (!) 103, temperature 97.6 F (36.4 C), temperature source Oral, resp. rate 18, height 5\' 5"  (1.651 m), weight (!) 142.4 kg (314 lb), last menstrual period  10/07/2016. General appearance: alert, cooperative and no distress Lungs: clear to auscultation bilaterally, normal work of breathing, no respiratory distress  Heart: regular rate and rhythm, 2+ distal pulses  Abdomen: soft, non-tender; bowel sounds normal Extremities: No sign of DVT Presentation: cephalic Fetal monitoring  Baseline: 130 bpm, Variability: Good {> 6 bpm), Accelerations: Reactive and Decelerations: Absent Uterine activity  Uterine irritability    Prenatal labs: ABO, Rh: --/Negative/-- (09/24 0932)  Antibody: Negative (05/07 1046) Rubella: 2.56 (05/07 1046) RPR: Non Reactive (09/10 0930)  HBsAg: Negative (05/07 1046)  HIV:   Non Reactive (05/07 1046) GBS: Positive (11/20 0000)  1 hr Glucola: Early: 123; Third tri: 79/88/91 Genetic screening: Quad negative  Anatomy US: WNL  Prenatal Transfer Tool  Maternal Diabetes: No Genetic Screening: Normal Maternal Ultrasounds/Referrals: Normal Fetal Ultrasounds or other Referrals:  None Maternal Substance Abuse:  No Significant Maternal Medications:  Meds include: Other:  Valtrex Significant Maternal Lab Results: Lab values include: Group B Strep positive, Rh negative  Results for orders placed or performed during the hospital encounter of 07/14/17 (from the past 24 hour(s))  CBC   Collection Time: 07/14/17  8:30 AM  Result Value Ref Range   WBC 11.9 (H) 4.0 - 10.5 K/uL   RBC 4.07 3.87 - 5.11 MIL/uL   Hemoglobin 11.5 (L) 12.0 - 15.0 g/dL   HCT 35.9 (L) 36.0 - 46.0 %   MCV 88.2 78.0 - 100.0 fL   MCH 28.3 26.0 - 34.0 pg   MCHC 32.0 30.0 - 36.0 g/dL   RDW 13.9 11.5 - 15.5 %   Platelets 311 150 - 400 K/uL    Assessment: Courtney Mooney is a 31 y.o. G3P1011 with an IUP at [redacted]w[redacted]d presenting for IOL for cHTN.  149/96 last BP. No complaints at this moment.   Plan: #Labor: TOLAC. Induction with Pitocin.  #Pain: Per request #FWB: Cat 1 #ID: GBS: Positive - PCN  #MOF:  Breast #MOC: POPs #Circ: N/A   Courtney Amel  PA Student  07/14/2017, 9:23 AM   I confirm that I have verified the information documented in the PA student 's note and that I have also personally performed the physical exam and all medical decision making activities.  Maye Hides CNM

## 2017-07-14 NOTE — Progress Notes (Addendum)
   Courtney Mooney is a 31 y.o. G3P1011 at [redacted]w[redacted]d  admitted for induction of labor due to chronic  Hypertension not on meds.   Subjective: Patient doing well; bouncing on the ball.   Objective: Vitals:   07/14/17 1532 07/14/17 1543 07/14/17 1602 07/14/17 1632  BP: 137/79  (!) 142/92 (!) 142/84  Pulse: 76  80 82  Resp:  20 18 20   Temp:  98.1 F (36.7 C)    TempSrc:  Oral    Weight:      Height:       No intake/output data recorded.  FHT:  FHR: 130 bpm, variability: moderate,  accelerations:  Present,  decelerations:  Absent UC:  SVE:   Dilation: Closed Station: Ballotable Exam by:: Maye Hides, CNM Pitocin @  18 mu/min BP have been 140/70-90 with occasional normal pressure.   Labs: Lab Results  Component Value Date   WBC 11.9 (H) 07/14/2017   HGB 11.5 (L) 07/14/2017   HCT 35.9 (L) 07/14/2017   MCV 88.2 07/14/2017   PLT 311 07/14/2017    Assessment / Plan: Early labor, no s/s of pre-e. Consider Foley Bulb when patient is 1 cm.   Labor: early labor Fetal Wellbeing:  Category I Pain Control:  labor support with medication Anticipated MOD:  NSVD  Starr Lake 07/14/2017, 5:02 PM

## 2017-07-15 ENCOUNTER — Inpatient Hospital Stay (HOSPITAL_COMMUNITY): Payer: PRIVATE HEALTH INSURANCE | Admitting: Anesthesiology

## 2017-07-15 ENCOUNTER — Encounter (HOSPITAL_COMMUNITY)
Admission: RE | Disposition: A | Discharge status: Home / Self Care | Payer: Self-pay | Source: Ambulatory Visit | Attending: Obstetrics and Gynecology

## 2017-07-15 ENCOUNTER — Encounter (HOSPITAL_COMMUNITY): Payer: Self-pay

## 2017-07-15 DIAGNOSIS — Z3A40 40 weeks gestation of pregnancy: Secondary | ICD-10-CM

## 2017-07-15 LAB — COMPREHENSIVE METABOLIC PANEL
ALT: 17 U/L (ref 14–54)
ANION GAP: 9 (ref 5–15)
AST: 24 U/L (ref 15–41)
Albumin: 2.5 g/dL — ABNORMAL LOW (ref 3.5–5.0)
Alkaline Phosphatase: 134 U/L — ABNORMAL HIGH (ref 38–126)
BILIRUBIN TOTAL: 0.6 mg/dL (ref 0.3–1.2)
BUN: 7 mg/dL (ref 6–20)
CO2: 20 mmol/L — ABNORMAL LOW (ref 22–32)
Calcium: 8.6 mg/dL — ABNORMAL LOW (ref 8.9–10.3)
Chloride: 108 mmol/L (ref 101–111)
Creatinine, Ser: 0.56 mg/dL (ref 0.44–1.00)
GFR calc Af Amer: >60 mL/min (ref >60)
Glucose, Bld: 85 mg/dL (ref 65–99)
POTASSIUM: 3.6 mmol/L (ref 3.5–5.1)
Sodium: 137 mmol/L (ref 135–145)
TOTAL PROTEIN: 5.7 g/dL — AB (ref 6.5–8.1)

## 2017-07-15 SURGERY — Surgical Case
Anesthesia: Spinal

## 2017-07-15 MED ORDER — SODIUM CHLORIDE 0.9 % IR SOLN
Status: DC | PRN
  Administered 2017-07-15: 1

## 2017-07-15 MED ORDER — TETANUS-DIPHTH-ACELL PERTUSSIS 5-2.5-18.5 LF-MCG/0.5 IM SUSP: 0.5000 mL | Freq: Once | INTRAMUSCULAR | Status: DC

## 2017-07-15 MED ORDER — SENNOSIDES-DOCUSATE SODIUM 8.6-50 MG PO TABS
2.0000 | ORAL_TABLET | ORAL | Status: DC
  Administered 2017-07-16: 2 via ORAL
  Filled 2017-07-15: qty 2

## 2017-07-15 MED ORDER — DIPHENHYDRAMINE HCL 25 MG PO CAPS
25.0000 mg | ORAL_CAPSULE | Freq: Four times a day (QID) | ORAL | Status: DC | PRN
  Administered 2017-07-16: 25 mg via ORAL
  Filled 2017-07-15: qty 1

## 2017-07-15 MED ORDER — NALOXONE HCL 0.4 MG/ML IJ SOLN: 0.4000 mg | INTRAMUSCULAR | Status: DC | PRN

## 2017-07-15 MED ORDER — SCOPOLAMINE 1 MG/3DAYS TD PT72
1.0000 | MEDICATED_PATCH | Freq: Once | TRANSDERMAL | Status: DC
  Filled 2017-07-15: qty 1

## 2017-07-15 MED ORDER — PROMETHAZINE HCL 25 MG/ML IJ SOLN
25.0000 mg | Freq: Four times a day (QID) | INTRAMUSCULAR | Status: DC | PRN
  Administered 2017-07-15: 25 mg via INTRAVENOUS
  Filled 2017-07-15: qty 1

## 2017-07-15 MED ORDER — DIPHENHYDRAMINE HCL 25 MG PO CAPS: 25.0000 mg | ORAL_CAPSULE | ORAL | Status: DC | PRN

## 2017-07-15 MED ORDER — HYDROMORPHONE HCL 1 MG/ML IJ SOLN: 0.2500 mg | INTRAMUSCULAR | Status: DC | PRN

## 2017-07-15 MED ORDER — OXYTOCIN 10 UNIT/ML IJ SOLN
INTRAMUSCULAR | Status: AC
  Filled 2017-07-15: qty 4

## 2017-07-15 MED ORDER — OXYCODONE HCL 5 MG/5ML PO SOLN: 5.0000 mg | Freq: Once | ORAL | Status: DC | PRN

## 2017-07-15 MED ORDER — PRENATAL MULTIVITAMIN CH
1.0000 | ORAL_TABLET | Freq: Every day | ORAL | Status: DC
  Administered 2017-07-17: 1 via ORAL
  Filled 2017-07-15: qty 1

## 2017-07-15 MED ORDER — FENTANYL CITRATE (PF) 100 MCG/2ML IJ SOLN
100.0000 ug | INTRAMUSCULAR | Status: DC | PRN
  Administered 2017-07-15 (×4): 100 ug via INTRAVENOUS
  Filled 2017-07-15 (×4): qty 2

## 2017-07-15 MED ORDER — PROMETHAZINE HCL 25 MG/ML IJ SOLN: 6.2500 mg | INTRAMUSCULAR | Status: DC | PRN

## 2017-07-15 MED ORDER — SIMETHICONE 80 MG PO CHEW
80.0000 mg | CHEWABLE_TABLET | Freq: Three times a day (TID) | ORAL | Status: DC
  Administered 2017-07-16 – 2017-07-17 (×5): 80 mg via ORAL
  Filled 2017-07-15 (×5): qty 1

## 2017-07-15 MED ORDER — OXYCODONE HCL 5 MG PO TABS: 5.0000 mg | ORAL_TABLET | Freq: Once | ORAL | Status: DC | PRN

## 2017-07-15 MED ORDER — ACETAMINOPHEN 325 MG PO TABS: 650.0000 mg | ORAL_TABLET | ORAL | Status: DC | PRN

## 2017-07-15 MED ORDER — SODIUM CHLORIDE 0.9% FLUSH: 3.0000 mL | INTRAVENOUS | Status: DC | PRN

## 2017-07-15 MED ORDER — ERYTHROMYCIN 5 MG/GM OP OINT
TOPICAL_OINTMENT | OPHTHALMIC | Status: AC
  Filled 2017-07-15: qty 1

## 2017-07-15 MED ORDER — PHENYLEPHRINE 8 MG IN D5W 100 ML (0.08MG/ML) PREMIX OPTIME
INJECTION | INTRAVENOUS | Status: DC | PRN
  Administered 2017-07-15: 60 ug/min via INTRAVENOUS

## 2017-07-15 MED ORDER — LACTATED RINGERS IV SOLN
INTRAVENOUS | Status: DC | PRN
  Administered 2017-07-15: 20:00:00 via INTRAVENOUS

## 2017-07-15 MED ORDER — OXYTOCIN 10 UNIT/ML IJ SOLN
INTRAVENOUS | Status: DC | PRN
  Administered 2017-07-15: 40 [IU] via INTRAVENOUS

## 2017-07-15 MED ORDER — NALBUPHINE HCL 10 MG/ML IJ SOLN: 5.0000 mg | Freq: Once | INTRAMUSCULAR | Status: DC | PRN

## 2017-07-15 MED ORDER — VITAMIN K1 1 MG/0.5ML IJ SOLN
INTRAMUSCULAR | Status: AC
  Filled 2017-07-15: qty 0.5

## 2017-07-15 MED ORDER — OXYTOCIN 40 UNITS IN LACTATED RINGERS INFUSION - SIMPLE MED: 2.5000 [IU]/h | INTRAVENOUS | Status: AC

## 2017-07-15 MED ORDER — WITCH HAZEL-GLYCERIN EX PADS: 1.0000 "application " | MEDICATED_PAD | CUTANEOUS | Status: DC | PRN

## 2017-07-15 MED ORDER — ONDANSETRON HCL 4 MG/2ML IJ SOLN
INTRAMUSCULAR | Status: AC
  Filled 2017-07-15: qty 2

## 2017-07-15 MED ORDER — ONDANSETRON HCL 4 MG/2ML IJ SOLN: 4.0000 mg | Freq: Three times a day (TID) | INTRAMUSCULAR | Status: DC | PRN

## 2017-07-15 MED ORDER — FENTANYL CITRATE (PF) 100 MCG/2ML IJ SOLN
INTRAMUSCULAR | Status: AC
  Filled 2017-07-15: qty 2

## 2017-07-15 MED ORDER — IBUPROFEN 600 MG PO TABS
600.0000 mg | ORAL_TABLET | Freq: Four times a day (QID) | ORAL | Status: DC
  Administered 2017-07-16 – 2017-07-17 (×7): 600 mg via ORAL
  Filled 2017-07-15 (×7): qty 1

## 2017-07-15 MED ORDER — DIPHENHYDRAMINE HCL 50 MG/ML IJ SOLN: 12.5000 mg | INTRAMUSCULAR | Status: DC | PRN

## 2017-07-15 MED ORDER — NALBUPHINE HCL 10 MG/ML IJ SOLN: 5.0000 mg | INTRAMUSCULAR | Status: DC | PRN

## 2017-07-15 MED ORDER — KETOROLAC TROMETHAMINE 30 MG/ML IJ SOLN
30.0000 mg | Freq: Once | INTRAMUSCULAR | Status: DC | PRN
  Administered 2017-07-15: 30 mg via INTRAVENOUS

## 2017-07-15 MED ORDER — SCOPOLAMINE 1 MG/3DAYS TD PT72
MEDICATED_PATCH | TRANSDERMAL | Status: AC
  Filled 2017-07-15: qty 2

## 2017-07-15 MED ORDER — DEXTROSE 5 % IV SOLN
INTRAVENOUS | Status: DC | PRN
  Administered 2017-07-15: 3 g via INTRAVENOUS

## 2017-07-15 MED ORDER — FENTANYL CITRATE (PF) 100 MCG/2ML IJ SOLN
INTRAMUSCULAR | Status: DC | PRN
  Administered 2017-07-15: 10 ug via INTRATHECAL

## 2017-07-15 MED ORDER — MORPHINE SULFATE (PF) 0.5 MG/ML IJ SOLN
INTRAMUSCULAR | Status: AC
  Filled 2017-07-15: qty 10

## 2017-07-15 MED ORDER — TRANEXAMIC ACID 1000 MG/10ML IV SOLN
1000.0000 mg | Freq: Once | INTRAVENOUS | Status: AC
  Administered 2017-07-15: 1000 mg via INTRAVENOUS
  Filled 2017-07-15: qty 10

## 2017-07-15 MED ORDER — COCONUT OIL OIL: 1.0000 "application " | TOPICAL_OIL | Status: DC | PRN

## 2017-07-15 MED ORDER — SCOPOLAMINE 1 MG/3DAYS TD PT72
MEDICATED_PATCH | TRANSDERMAL | Status: DC | PRN
  Administered 2017-07-15: 1 via TRANSDERMAL

## 2017-07-15 MED ORDER — SIMETHICONE 80 MG PO CHEW: 80.0000 mg | CHEWABLE_TABLET | ORAL | Status: DC | PRN

## 2017-07-15 MED ORDER — DEXAMETHASONE SODIUM PHOSPHATE 10 MG/ML IJ SOLN
INTRAMUSCULAR | Status: DC | PRN
  Administered 2017-07-15: 10 mg via INTRAVENOUS

## 2017-07-15 MED ORDER — SIMETHICONE 80 MG PO CHEW
80.0000 mg | CHEWABLE_TABLET | ORAL | Status: DC
  Administered 2017-07-16: 80 mg via ORAL
  Filled 2017-07-15: qty 1

## 2017-07-15 MED ORDER — DEXAMETHASONE SODIUM PHOSPHATE 10 MG/ML IJ SOLN
INTRAMUSCULAR | Status: AC
  Filled 2017-07-15: qty 1

## 2017-07-15 MED ORDER — MEPERIDINE HCL 25 MG/ML IJ SOLN: 6.2500 mg | INTRAMUSCULAR | Status: DC | PRN

## 2017-07-15 MED ORDER — LACTATED RINGERS IV SOLN
INTRAVENOUS | Status: DC
  Administered 2017-07-16: 01:00:00 via INTRAVENOUS

## 2017-07-15 MED ORDER — ONDANSETRON HCL 4 MG/2ML IJ SOLN
INTRAMUSCULAR | Status: DC | PRN
  Administered 2017-07-15: 4 mg via INTRAVENOUS

## 2017-07-15 MED ORDER — KETOROLAC TROMETHAMINE 30 MG/ML IJ SOLN
INTRAMUSCULAR | Status: AC
  Administered 2017-07-15: 30 mg via INTRAVENOUS
  Filled 2017-07-15: qty 1

## 2017-07-15 MED ORDER — BUTORPHANOL TARTRATE 1 MG/ML IJ SOLN
1.0000 mg | INTRAMUSCULAR | Status: DC | PRN
  Administered 2017-07-15 (×2): 1 mg via INTRAVENOUS
  Filled 2017-07-15 (×2): qty 1

## 2017-07-15 MED ORDER — DEXTROSE 5 % IV SOLN
INTRAVENOUS | Status: AC
  Filled 2017-07-15: qty 3000

## 2017-07-15 MED ORDER — NALOXONE HCL 0.4 MG/ML IJ SOLN
1.0000 ug/kg/h | INTRAVENOUS | Status: DC | PRN
  Filled 2017-07-15: qty 5

## 2017-07-15 MED ORDER — DIBUCAINE 1 % RE OINT: 1.0000 "application " | TOPICAL_OINTMENT | RECTAL | Status: DC | PRN

## 2017-07-15 MED ORDER — MENTHOL 3 MG MT LOZG: 1.0000 | LOZENGE | OROMUCOSAL | Status: DC | PRN

## 2017-07-15 MED ORDER — NALBUPHINE HCL 10 MG/ML IJ SOLN
5.0000 mg | INTRAMUSCULAR | Status: DC | PRN
  Administered 2017-07-15 – 2017-07-16 (×2): 5 mg via INTRAVENOUS
  Filled 2017-07-15 (×2): qty 1

## 2017-07-15 MED ORDER — ZOLPIDEM TARTRATE 5 MG PO TABS: 5.0000 mg | ORAL_TABLET | Freq: Every evening | ORAL | Status: DC | PRN

## 2017-07-15 MED ORDER — BUPIVACAINE IN DEXTROSE 0.75-8.25 % IT SOLN
INTRATHECAL | Status: DC | PRN
  Administered 2017-07-15: 1.6 mL via INTRATHECAL

## 2017-07-15 MED ORDER — MORPHINE SULFATE (PF) 0.5 MG/ML IJ SOLN
INTRAMUSCULAR | Status: DC | PRN
  Administered 2017-07-15: .2 mg via INTRATHECAL

## 2017-07-15 SURGICAL SUPPLY — 41 items
BENZOIN TINCTURE PRP APPL 2/3 (GAUZE/BANDAGES/DRESSINGS) ×3 IMPLANT
CHLORAPREP W/TINT 26ML (MISCELLANEOUS) ×3 IMPLANT
CLAMP CORD UMBIL (MISCELLANEOUS) IMPLANT
CLOSURE STERI-STRIP 1/2X4 (GAUZE/BANDAGES/DRESSINGS) ×1
CLOSURE WOUND 1/2 X4 (GAUZE/BANDAGES/DRESSINGS)
CLOTH BEACON ORANGE TIMEOUT ST (SAFETY) ×3 IMPLANT
CLSR STERI-STRIP ANTIMIC 1/2X4 (GAUZE/BANDAGES/DRESSINGS) ×2 IMPLANT
DRSG OPSITE POSTOP 4X10 (GAUZE/BANDAGES/DRESSINGS) ×3 IMPLANT
ELECT REM PT RETURN 9FT ADLT (ELECTROSURGICAL) ×3
ELECTRODE REM PT RTRN 9FT ADLT (ELECTROSURGICAL) ×1 IMPLANT
EXTRACTOR VACUUM KIWI (MISCELLANEOUS) IMPLANT
GLOVE BIO SURGEON ST LM GN SZ9 (GLOVE) ×3 IMPLANT
GLOVE BIOGEL PI IND STRL 7.0 (GLOVE) ×1 IMPLANT
GLOVE BIOGEL PI IND STRL 9 (GLOVE) ×1 IMPLANT
GLOVE BIOGEL PI INDICATOR 7.0 (GLOVE) ×2
GLOVE BIOGEL PI INDICATOR 9 (GLOVE) ×2
GOWN STRL REUS W/TWL 2XL LVL3 (GOWN DISPOSABLE) ×3 IMPLANT
GOWN STRL REUS W/TWL LRG LVL3 (GOWN DISPOSABLE) ×3 IMPLANT
HOVERMATT SINGLE USE (MISCELLANEOUS) ×3 IMPLANT
NEEDLE HYPO 25X5/8 SAFETYGLIDE (NEEDLE) IMPLANT
NS IRRIG 1000ML POUR BTL (IV SOLUTION) ×3 IMPLANT
PACK C SECTION WH (CUSTOM PROCEDURE TRAY) ×3 IMPLANT
PAD OB MATERNITY 4.3X12.25 (PERSONAL CARE ITEMS) ×3 IMPLANT
PENCIL SMOKE EVAC W/HOLSTER (ELECTROSURGICAL) ×3 IMPLANT
RETRACTOR TRAXI PANNICULUS (MISCELLANEOUS) ×1 IMPLANT
RTRCTR C-SECT PINK 25CM LRG (MISCELLANEOUS) IMPLANT
RTRCTR C-SECT PINK 34CM XLRG (MISCELLANEOUS) IMPLANT
STRIP CLOSURE SKIN 1/2X4 (GAUZE/BANDAGES/DRESSINGS) IMPLANT
SUT MNCRL 0 VIOLET CTX 36 (SUTURE) ×2 IMPLANT
SUT MONOCRYL 0 CTX 36 (SUTURE) ×4
SUT PLAIN 2 0 (SUTURE) ×4
SUT PLAIN ABS 2-0 CT1 27XMFL (SUTURE) ×2 IMPLANT
SUT VIC AB 0 CT1 27 (SUTURE) ×2
SUT VIC AB 0 CT1 27XBRD ANBCTR (SUTURE) ×1 IMPLANT
SUT VIC AB 2-0 CT1 27 (SUTURE) ×2
SUT VIC AB 2-0 CT1 TAPERPNT 27 (SUTURE) ×1 IMPLANT
SUT VIC AB 4-0 KS 27 (SUTURE) ×3 IMPLANT
SYR BULB IRRIGATION 50ML (SYRINGE) IMPLANT
TOWEL OR 17X24 6PK STRL BLUE (TOWEL DISPOSABLE) ×3 IMPLANT
TRAXI PANNICULUS RETRACTOR (MISCELLANEOUS) ×2
TRAY FOLEY BAG SILVER LF 14FR (SET/KITS/TRAYS/PACK) ×3 IMPLANT

## 2017-07-15 NOTE — Progress Notes (Signed)
Patient ID: Courtney Mooney, female   DOB: 12/09/1985, 31 y.o.   MRN: [id number] Courtney Mooney is a 31 y.o. G3P1011 at [redacted]w[redacted]d admitted for induction of labor due to [facility]- no meds, TOLAC.  Subjective: Patient wants to have a baby. Frustrated with the slow delay in her process.   Objective: BP (!) 144/87   Pulse (!) 101   Temp 98 F (36.7 C) (Oral)   Resp 18   Ht 5\' 5"  (1.651 m)   Wt (!) 314 lb (142.4 kg)   LMP 10/07/2016   BMI 52.25 kg/m  No intake/output data recorded.  FHT:  FHR: 125 bpm, variability: moderate,  accelerations:  Present,  decelerations:  Present earlies UC:   occasional  SVE:   Dilation: Fingertip Effacement (%): 80 Station: -2 Exam by:: Doree Fudge, CNM   tight dimple > fingertip after breaking up adhesions   Labs: Lab Results  Component Value Date   WBC 11.9 (H) 07/14/2017   HGB 11.5 (L) 07/14/2017   HCT 35.9 (L) 07/14/2017   MCV 88.2 07/14/2017   PLT 311 07/14/2017    Assessment / Plan: IOL d/t CHTN-no meds, TOLAC.  Early labor  Labor: able to break up adhesions with kelly. FB placed afterwards. Will hold off on restarting pitocin at this time. Patient encouraged to rest.  Fetal Wellbeing:  Category I Pain Control:  IV pain meds I/D:  PCN for GBS+ Anticipated MOD:  VBAC  Luiz Blare, DO 07/15/2017, 9:30 AM

## 2017-07-15 NOTE — Progress Notes (Signed)
Courtney Mooney is a 31 y.o. G3P1011 at [redacted]w[redacted]d by  admitted for induction of labor due to Hypertension. She had a tight cervix that has been treated with foley bulb, now 2/with foley in the cervix but the head is sky-high. Subjective: Pt losing motivation for TOLAC. Had pit break followed by Foley bulb, now on 6 mU/min of pitocin.  Objective: BP (!) 149/50   Pulse (!) 107   Temp 98.2 F (36.8 C) (Oral)   Resp 18   Ht 5\' 5"  (1.651 m)   Wt (!) 314 lb (142.4 kg)   LMP 10/07/2016   BMI 52.25 kg/m  No intake/output data recorded. No intake/output data recorded.  FHT:  FHR: 145 bpm, variability: moderate,  accelerations:  Present,  decelerations:  Absent UC:   irregular, every 3-5 minutes SVE:   Dilation: 2 Effacement (%): 100 Station: -2 Exam by:: Dr. Glo Herring It is 100 % on the foley, but the head is way above the foley. U/s at bedside confirms high station , occiput transverse. I Labs: Lab Results  Component Value Date   WBC 11.9 (H) 07/14/2017   HGB 11.5 (L) 07/14/2017   HCT 35.9 (L) 07/14/2017   MCV 88.2 07/14/2017   PLT 311 07/14/2017    Assessment / Plan: Courtney Mooney witness confirmed. Protracted latent phase Poor prognosis for descent of vertex, if still unengaged after 1 1/2 day IOL. Likelihood of a prolonged labor from now if she desires to continue tolac effort, with no assurances of vaginal delivery. Pt is done with TOLAC, and I agree that repeat c/s appropriate. Risks / rationale discussed .  Will give Tranexemic acid 1 gm 30 mins before c/s.  Consented for C/s.  Labor: essentially no progress today. Preeclampsia:   Fetal Wellbeing:  Category I Pain Control:  Labor support without medications I/D:  n/a Anticipated MOD:  [place] delivery   Jonnie Kind 07/15/2017, 6:46 PM

## 2017-07-15 NOTE — Transfer of Care (Signed)
Immediate Anesthesia Transfer of Care Note  Patient: [place]  Procedure(s) Performed: CESAREAN SECTION (N/A )  Patient Location: PACU  Anesthesia Type:Spinal  Level of Consciousness: awake, alert  and oriented  Airway & Oxygen Therapy: Patient Spontanous Breathing  Post-op Assessment: Report given to RN and Post -op Vital signs reviewed and stable  Post vital signs: Reviewed and stable  Last Vitals:  Vitals:   07/15/17 1630 07/15/17 1820  BP:  (!) 149/50  Pulse:  (!) 107  Resp: 18 18  Temp:      Last Pain:  Vitals:   07/15/17 1800  TempSrc:   PainSc: 2       Patients Stated Pain Goal: 6 (70/35/00 9381)  Complications: No apparent anesthesia complications

## 2017-07-15 NOTE — Anesthesia Procedure Notes (Signed)
Spinal  Patient location during procedure: OB Start time: 07/15/2017 6:56 PM End time: 07/15/2017 7:01 PM Staffing Anesthesiologist: Lynda Rainwater, MD Performed: anesthesiologist  Preanesthetic Checklist Completed: patient identified, surgical consent, pre-op evaluation, timeout performed, IV checked, risks and benefits discussed and monitors and equipment checked Spinal Block Patient position: sitting Prep: Betadine and site prepped and draped Patient monitoring: heart rate, cardiac monitor, continuous pulse ox and blood pressure Approach: midline Location: L3-4 Injection technique: single-shot Needle Needle type: Pencan  Needle gauge: 24 G Needle length: 10 cm Assessment Sensory level: T4

## 2017-07-15 NOTE — Brief Op Note (Signed)
07/15/2017  9:13 PM  PATIENT:  Courtney Mooney  31 y.o. female  PRE-OPERATIVE DIAGNOSIS:  repeat cesarean section due to failure to progress and failed induction  POST-OPERATIVE DIAGNOSIS:  repeat cesarean section due to failure to progress and failed induction  PROCEDURE:  Procedure(s): CESAREAN SECTION (N/A)  SURGEON:  Surgeon(s) and Role:    * Jonnie Kind, MD - Primary  PHYSICIAN ASSISTANT:   ASSISTANTS: None   ANESTHESIA:   spinal  EBL:  892 mL   BLOOD ADMINISTERED:none  DRAINS: Urinary Catheter (Foley)   LOCAL MEDICATIONS USED:  NONE  SPECIMEN:  Source of Specimen:  Placenta to labor and delivery  DISPOSITION OF SPECIMEN:  PATHOLOGY  COUNTS:  YES  TOURNIQUET:  * No tourniquets in log *  DICTATION: .Dragon Dictation  PLAN OF CARE: Has inpatient orders for admission in place  PATIENT DISPOSITION:  PACU - hemodynamically stable.   Delay start of Pharmacological VTE agent (>24hrs) due to surgical blood loss or risk of bleeding: not applicable Details of procedure: Patient was taken the operating room spinal anesthesia introduced, abdomen prepped and draped with Traxi in position holding the abdomen up.  The previous cesarean section scar was irregular so as per patient consent verbal before the case we excised the old scar and some adjacent tissue to allow for more symmetric incision.  This ellipse of skin 20 cm in length by 5 cm in width was removed with underlying fatty tissue, with careful attention to hemostasis.  A transverse incision was made through the fascia into the peritoneal cavity without difficulty at the midline.  There were minimal adhesions in the anterior abdominal wall.  Alexis wound retractor was positioned in place.  Attention was then directed to the lower uterine segment was transverse lower uterine segment bladder flap developed and transverse uterine incision followed.  Clear amniotic fluid was encountered.  Fetal vertex was delivered  through the incision using fundal pressure.  At 1 minute the cord was clamped.  The placenta delivered in response to Pitocin and crede uterine massage.  Hemostasis was good and uterine tone was excellent. .  The uterus was swabbed out.  Some membrane remnants were removed from the lower uterine segment.  The uterus was closed using a running locking 0 Monocryl first layer in a continuous running 0 Monocryl subcu layer.  Hemostasis was good.  Abdomen was irrigated out.  Anterior peritoneum was closed using a running 2-0 Vicryl.  There was small amount of omental adhesions near the upper aspect of the incision that was left in situ.  The fascia was closed transversely using running 0 Vicryl, and subcutaneous fatty tissues were recontoured to allow for optimal skin edge approximation.  The upper side was somewhat thicker so the lower side was undermined to allow for more symmetric reapproximation.  2 layers of horizontal mattress sutures of 2 oh plain were used to reapproximate the subcu fatty tissues with good hemostasis achieved then subcuticular 4-0 Vicryl closure of the skin completed the procedure sponge and needle counts were correct EBL estimated at approximately 800 cc though this was fluid from amniotic fluid as well as blood.

## 2017-07-15 NOTE — Progress Notes (Signed)
Labor Progress Note Courtney Mooney is a 31 y.o. G3P1011 at [redacted]w[redacted]d presented for IOL for CHTN- on no medication   S: Patient breathing through contractions- requesting IV pain medication   O:  BP (!) 145/90   Pulse 99   Temp 98.2 F (36.8 C) (Oral)   Resp 18   Ht 5\' 5"  (1.651 m)   Wt (!) 314 lb (142.4 kg)   LMP 10/07/2016   BMI 52.25 kg/m  EFM: 130/moderate/+accel with no decel  CVE: Dilation: 1.5 Effacement (%): 100 Cervical Position: Posterior Station: -2 Presentation: Vertex Exam by:: Davis,RN   A&P: 31 y.o. G3P1011 [redacted]w[redacted]d IOL for CHTN  #Labor: Foley bulb still in place- Pitocin stable at low dose of 59mU  #Pain: IV Fentanyl  #FWB: Cat 1 #GBS positive  [name], CNM 5:42 PM

## 2017-07-15 NOTE — Progress Notes (Signed)
Patient ID: Courtney Mooney, female   DOB: [birth year], 31 y.o.   MRN: [id number] Courtney Mooney is a 31 y.o. G3P1011 at [redacted]w[redacted]d admitted for induction of labor due to [facility]- no meds, TOLAC.  Subjective: Uncomfortable w/ uc's, just got pain medicine.   Objective: BP 140/84   Pulse 94   Temp 98 F (36.7 C) (Oral)   Resp 20   Ht 5\' 5"  (1.651 m)   Wt (!) 142.4 kg (314 lb)   LMP 10/07/2016   BMI 52.25 kg/m  No intake/output data recorded.  FHT:  FHR: 130 bpm, variability: moderate,  accelerations:  Present,  decelerations:  Present earlies UC:   regular, every 2-3 minutes  SVE:   Dilation: Fingertip Effacement (%): 80 Station: -2 Exam by:: Doree Fudge, CNM tight dimple still-unable to get finger or foley bulb cath through. Mod bloody show. Tried to break up scar tissue from prev cryo w/o success  Pitocin @ 18 mu/min  Labs: Lab Results  Component Value Date   WBC 11.9 (H) 07/14/2017   HGB 11.5 (L) 07/14/2017   HCT 35.9 (L) 07/14/2017   MCV 88.2 07/14/2017   PLT 311 07/14/2017    Assessment / Plan: IOL d/t CHTN-no meds, TOLAC. Has been on pit since 0950 yesterday morning, cx still dimple/90/-2, unable to get a finger or foley bulb cath through. Mod amt bloody show- Cat 1 FHR, will continue to monitor. Discussed options of continuing w/ current plan vs. Pit break- wants pit break. Plan to restart in about 2 hours.   Labor: not yet Fetal Wellbeing:  Category I Pain Control:  IV pain meds Pre-eclampsia: n/a I/D:  PCN for GBS+ Anticipated MOD:  VBAC  Tawnya Crook CNM, WHNP-BC 07/15/2017, 6:36 AM

## 2017-07-15 NOTE — Progress Notes (Signed)
Dr. Glo Herring and Dr. Gerarda Fraction at bedside discussing with pt plan of care. Pt requesting a repeat c-section due to lack of progress. Dr. Glo Herring reviewing the risks and benefits of c-section. Consent signed.

## 2017-07-15 NOTE — Progress Notes (Signed)
Courtney Mooney is a 31 y.o. G3P1011 at [redacted]w[redacted]d.  Subjective: Patient is feeling contractions more, is still able to ambulate to restroom.    Objective: BP 137/87   Pulse 87   Temp 97.8 F (36.6 C) (Oral)   Resp 18   Ht 5\' 5"  (1.651 m)   Wt (!) 142.4 kg (314 lb)   LMP 10/07/2016   BMI 52.25 kg/m    FHT:  FHR: 125 bpm, variability: appropriate,  accelerations:  15x15,  decelerations:  none UC:   Q 2-35minutes, 40sec Dilation: Fingertip Effacement (%): 80 Station: -2 Presentation: Vertex Exam by:: Knute Neu, CNM  Labs: Results for orders placed or performed during the hospital encounter of 07/14/17 (from the past 24 hour(s))  Protein / creatinine ratio, urine     Status: None   Collection Time: 07/14/17  7:44 AM  Result Value Ref Range   Creatinine, Urine 192.00 mg/dL   Total Protein, Urine 20 mg/dL   Protein Creatinine Ratio 0.10 0.00 - 0.15 mg/mg[Cre]  CBC     Status: Abnormal   Collection Time: 07/14/17  8:30 AM  Result Value Ref Range   WBC 11.9 (H) 4.0 - 10.5 K/uL   RBC 4.07 3.87 - 5.11 MIL/uL   Hemoglobin 11.5 (L) 12.0 - 15.0 g/dL   HCT 35.9 (L) 36.0 - 46.0 %   MCV 88.2 78.0 - 100.0 fL   MCH 28.3 26.0 - 34.0 pg   MCHC 32.0 30.0 - 36.0 g/dL   RDW 13.9 11.5 - 15.5 %   Platelets 311 150 - 400 K/uL  RPR     Status: None   Collection Time: 07/14/17  8:30 AM  Result Value Ref Range   RPR Ser Ql Non Reactive Non Reactive  Comprehensive metabolic panel     Status: Abnormal   Collection Time: 07/15/17 12:10 AM  Result Value Ref Range   Sodium 137 135 - 145 mmol/L   Potassium 3.6 3.5 - 5.1 mmol/L   Chloride 108 101 - 111 mmol/L   CO2 20 (L) 22 - 32 mmol/L   Glucose, Bld 85 65 - 99 mg/dL   BUN 7 6 - 20 mg/dL   Creatinine, Ser 0.56 0.44 - 1.00 mg/dL   Calcium 8.6 (L) 8.9 - 10.3 mg/dL   Total Protein 5.7 (L) 6.5 - 8.1 g/dL   Albumin 2.5 (L) 3.5 - 5.0 g/dL   AST 24 15 - 41 U/L   ALT 17 14 - 54 U/L   Alkaline Phosphatase 134 (H) 38 - 126 U/L   Total Bilirubin 0.6  0.3 - 1.2 mg/dL   GFR calc non Af Amer >60 >60 mL/min   GFR calc Af Amer >60 >60 mL/min   Anion gap 9 5 - 15    Assessment / Plan: cervix unchanged [redacted]w[redacted]d week IUP Labor: pit Fetal Wellbeing:  Category 1 Pain Control:  None per maternal request Anticipated MOD:  svd  Sherene Sires, DO 07/15/2017 2:11 AM

## 2017-07-15 NOTE — Anesthesia Postprocedure Evaluation (Signed)
Anesthesia Post Note  Patient: TEFL teacher  Procedure(s) Performed: CESAREAN SECTION (N/A )     Patient location during evaluation: PACU Anesthesia Type: Spinal Level of consciousness: oriented and awake and alert Pain management: pain level controlled Vital Signs Assessment: post-procedure vital signs reviewed and stable Respiratory status: spontaneous breathing and respiratory function stable Cardiovascular status: blood pressure returned to baseline and stable Postop Assessment: no headache, no backache and no apparent nausea or vomiting Anesthetic complications: no    Last Vitals:  Vitals:   07/15/17 2115 07/15/17 2123  BP: (!) 144/78 140/68  Pulse: 82 74  Resp: (!) 21 20  Temp:  36.5 C  SpO2: 100%     Last Pain:  Vitals:   07/15/17 2123  TempSrc: Oral  PainSc: 0-No pain   Pain Goal: Patients Stated Pain Goal: 6 (07/14/17 0930)               Lynda Rainwater

## 2017-07-15 NOTE — Anesthesia Preprocedure Evaluation (Signed)
Anesthesia Evaluation  Patient identified by MRN, date of birth, ID band Patient awake    Reviewed: Allergy & Precautions, NPO status , Patient's Chart, lab work & pertinent test results  Airway Mallampati: II  TM Distance: >3 FB Neck ROM: Full    Dental no notable dental hx.    Pulmonary neg pulmonary ROS,    Pulmonary exam normal breath sounds clear to auscultation       Cardiovascular hypertension, Pt. on medications negative cardio ROS Normal cardiovascular exam Rhythm:Regular Rate:Normal     Neuro/Psych negative neurological ROS  negative psych ROS   GI/Hepatic negative GI ROS, Neg liver ROS,   Endo/Other  negative endocrine ROSMorbid obesity  Renal/GU negative Renal ROS  negative genitourinary   Musculoskeletal negative musculoskeletal ROS (+)   Abdominal   Peds negative pediatric ROS (+)  Hematology negative hematology ROS (+)   Anesthesia Other Findings   Reproductive/Obstetrics negative OB ROS (+) Pregnancy                             Anesthesia Physical Anesthesia Plan  ASA: III  Anesthesia Plan: Spinal   Post-op Pain Management:    Induction:   PONV Risk Score and Plan: 2 and Treatment may vary due to age or medical condition and Ondansetron  Airway Management Planned: Natural Airway  Additional Equipment:   Intra-op Plan:   Post-operative Plan:   Informed Consent: I have reviewed the patients History and Physical, chart, labs and discussed the procedure including the risks, benefits and alternatives for the proposed anesthesia with the patient or authorized representative who has indicated his/her understanding and acceptance.   Dental advisory given  Plan Discussed with: CRNA  Anesthesia Plan Comments:         Anesthesia Quick Evaluation

## 2017-07-15 NOTE — Progress Notes (Signed)
LABOR PROGRESS NOTE  Courtney Mooney is a 31 y.o. G3P1011 at [redacted]w[redacted]d  admitted for IOL for CHTN- no meds. TOLAC   Subjective: Patient is comfortable- rest was must needed. Contractions have spaced out and not painful as before. Discussed low dose pitocin with foley bulb- patient agreed.   Objective: BP (!) 156/77   Pulse 75   Temp 98 F (36.7 C) (Oral)   Resp 18   Ht 5\' 5"  (1.651 m)   Wt (!) 314 lb (142.4 kg)   LMP 10/07/2016   BMI 52.25 kg/m  or  Vitals:   07/15/17 0802 07/15/17 0956 07/15/17 0959 07/15/17 1300  BP: (!) 144/87 (!) 162/90 (!) 156/77   Pulse: (!) 101 84 75   Resp: 18 18 18 18   Temp:  98 F (36.7 C)    TempSrc:  Oral    Weight:      Height:        Pitocin drip restarted  Dilation: Fingertip Effacement (%): 80 Cervical Position: Posterior Station: -2 Presentation: Vertex Exam by:: Doree Fudge, CNM FHT: baseline rate 130, moderate varibility, + acel, no decel Toco: irregular   Labs: Lab Results  Component Value Date   WBC 11.9 (H) 07/14/2017   HGB 11.5 (L) 07/14/2017   HCT 35.9 (L) 07/14/2017   MCV 88.2 07/14/2017   PLT 311 07/14/2017    Patient Active Problem List   Diagnosis Date Noted  . Encounter for induction of labor 07/14/2017  . Refusal of blood transfusions as patient is Jehovah's Witness 07/14/2017  . Positive GBS test 06/21/2017  . Herpes simplex type 2 (HSV-2) infection affecting pregnancy, antepartum 04/06/2017  . History of prediabetes 02/02/2017  . Rh negative state in antepartum period 12/03/2016  . Supervision of high-risk pregnancy 12/01/2016  . Previous cesarean delivery, antepartum 12/01/2016  . Cervical abnormality affecting pregnancy, antepartum 12/01/2016  . Chronic hypertension during pregnancy, antepartum 12/01/2016  . Maternal obesity, antepartum 12/01/2016    Assessment / Plan: 31 y.o. G3P1011 at [redacted]w[redacted]d here for IOL for CHTN. TOLAC   Labor: Foley bulb still in place- low dose pitocin started to go up to  30mU Fetal Wellbeing:  Cat 1 Pain Control:  IV pain medicine currently, pain medication PRN  Anticipated MOD:  SVD  [name], CNM 07/15/2017, 1:44 PM

## 2017-07-15 NOTE — Op Note (Signed)
Please see the brief operative note for surgical details 

## 2017-07-16 ENCOUNTER — Encounter (HOSPITAL_COMMUNITY): Payer: Self-pay | Admitting: Obstetrics and Gynecology

## 2017-07-16 LAB — CBC
HEMATOCRIT: 30.5 % — AB (ref 36.0–46.0)
HEMOGLOBIN: 10.3 g/dL — AB (ref 12.0–15.0)
MCH: 28.8 pg (ref 26.0–34.0)
MCHC: 33.8 g/dL (ref 30.0–36.0)
MCV: 85.2 fL (ref 78.0–100.0)
Platelets: 262 10*3/uL (ref 150–400)
RBC: 3.58 MIL/uL — ABNORMAL LOW (ref 3.87–5.11)
RDW: 13.9 % (ref 11.5–15.5)
WBC: 15.6 10*3/uL — AB (ref 4.0–10.5)

## 2017-07-16 LAB — ABO/RH: ABO/RH(D): O NEG

## 2017-07-16 MED ORDER — TRIAMTERENE-HCTZ 37.5-25 MG PO TABS
1.0000 | ORAL_TABLET | Freq: Every day | ORAL | Status: DC
  Administered 2017-07-16 – 2017-07-17 (×2): 1 via ORAL
  Filled 2017-07-16 (×3): qty 1

## 2017-07-16 MED ORDER — RHO D IMMUNE GLOBULIN 1500 UNIT/2ML IJ SOSY
300.0000 ug | PREFILLED_SYRINGE | Freq: Once | INTRAMUSCULAR | Status: AC
  Administered 2017-07-16: 300 ug via INTRAVENOUS
  Filled 2017-07-16: qty 2

## 2017-07-16 NOTE — Progress Notes (Signed)
Subjective: Postpartum Day #1: Cesarean Delivery Patient reports tolerating PO.  Was started on Maxzide last PM due to borderline BPs; breastfeeding; desires POPs for contraception; denies dizziness with ambulation; foley still in place  Objective: Vital signs in last 24 hours: Temp:  [97.5 F (36.4 C)-98.5 F (36.9 C)] 98.5 F (36.9 C) (12/20 0346) Pulse Rate:  [53-110] 53 (12/20 0555) Resp:  [16-21] 16 (12/20 0555) BP: (114-162)/(50-97) 129/77 (12/20 0555) SpO2:  [94 %-100 %] 94 % (12/20 0710)  Physical Exam:  General: cooperative, fatigued and no distress Lochia: appropriate Uterine Fundus: firm Incision: honeycomb intact, stained DVT Evaluation: No evidence of DVT seen on physical exam.  Recent Labs    07/14/17 0830 07/16/17 0510  HGB 11.5* 10.3*  HCT 35.9* 30.5*    Assessment/Plan: Status post Cesarean section. Doing well postoperatively.  Continue current care- would like to be discharged tomorrow 12/21. Continue Maxzide- watch BPs  Aikam Hellickson CNM 07/16/2017, 7:44 AM

## 2017-07-16 NOTE — Lactation Note (Signed)
This note was copied from a baby's chart. Lactation Consultation Note  Patient Name: Courtney Mooney Today's Date: 07/16/2017   P2, Ex BF for 3 months.  Hx of PCOS. Baby latched in upright football hold upon entering. Sucks and swallows observed.  Nipples evert with stimulation. Mother seems comfortable w/ breastfeeding. Mom encouraged to feed baby 8-12 times/24 hours and with feeding cues.  Mom made aware of O/P services, breastfeeding support groups, community resources, and our phone # for post-discharge questions.               Maternal Data    Feeding Feeding Type: Breast Milk Length of feed: 30 min  LATCH Score                   Interventions    Lactation Tools Discussed/Used     Consult Status      Courtney Mooney 07/16/2017, 1:26 PM

## 2017-07-17 LAB — BIRTH TISSUE RECOVERY COLLECTION (PLACENTA DONATION)

## 2017-07-17 MED ORDER — OXYCODONE HCL 5 MG PO TABS: 5.0000 mg | ORAL_TABLET | Freq: Three times a day (TID) | ORAL | 0 refills | Status: DC | PRN

## 2017-07-17 MED ORDER — IBUPROFEN 600 MG PO TABS: 600.0000 mg | ORAL_TABLET | Freq: Four times a day (QID) | ORAL | 0 refills | Status: AC

## 2017-07-17 NOTE — Discharge Summary (Signed)
OB Discharge Summary     Patient Name: Courtney Mooney DOB: 03/20/1986 MRN: [id number]  Date of admission: 07/14/2017 Delivering MD: Jonnie Kind   Date of discharge: 07/17/2017  Admitting diagnosis: INDUCTION Intrauterine pregnancy: [redacted]w[redacted]d     Secondary diagnosis:  Active Problems:   Encounter for induction of labor   Refusal of blood transfusions as patient is Jehovah's Witness  Additional problems: RH neg Maternal obesity GBS pos HSV during pregnancy     Discharge diagnosis: Term Pregnancy Delivered                                                                                                Post partum procedures:rhogam  Augmentation: Pitocin  Complications: None  Hospital course:  Induction of Labor With Cesarean Section  31 y.o. yo [id number] at [redacted]w[redacted]d was admitted to the hospital 07/14/2017 for induction of labor. Patient had a labor course significant for failure to progress during IOL. The patient went for cesarean section due to Arrest of Descent, and delivered a Viable infant,@BABYSUPPRESS (DBLINK,ept,110,,1,,) Membrane Rupture Time/Date: )7:33 PM ,07/15/2017   @Details  of operation can be found in separate operative Note.  Patient had an uncomplicated postpartum course. She is ambulating, tolerating a regular diet, passing flatus, and urinating well.  Patient is discharged home in stable condition on 07/17/17.                                    Physical exam  Vitals:   07/16/17 1405 07/16/17 1600 07/16/17 1725 07/17/17 0601  BP: 106/60  (!) 133/59 137/82  Pulse: 66  82 68  Resp: 20  18 18   Temp: 97.9 F (36.6 C)  98 F (36.7 C) 98.8 F (37.1 C)  TempSrc: Oral  Oral   SpO2: 99% 100% 100%   Weight:      Height:       General: alert, cooperative and no distress Lochia: appropriate Uterine Fundus: firm Incision: Healing well with no significant drainage DVT Evaluation: No evidence of DVT seen on physical exam. Labs: Lab Results  Component Value Date    WBC 15.6 (H) 07/16/2017   HGB 10.3 (L) 07/16/2017   HCT 30.5 (L) 07/16/2017   MCV 85.2 07/16/2017   PLT 262 07/16/2017   CMP Latest Ref Rng & Units 07/15/2017  Glucose 65 - 99 mg/dL 85  BUN 6 - 20 mg/dL 7  Creatinine 0.44 - 1.00 mg/dL 0.56  Sodium 135 - 145 mmol/L 137  Potassium 3.5 - 5.1 mmol/L 3.6  Chloride 101 - 111 mmol/L 108  CO2 22 - 32 mmol/L 20(L)  Calcium 8.9 - 10.3 mg/dL 8.6(L)  Total Protein 6.5 - 8.1 g/dL 5.7(L)  Total Bilirubin 0.3 - 1.2 mg/dL 0.6  Alkaline Phos 38 - 126 U/L 134(H)  AST 15 - 41 U/L 24  ALT 14 - 54 U/L 17    Discharge instruction: per After Visit Summary and "Baby and Me Booklet".  After visit meds:  Allergies as of 07/17/2017   No Known Allergies  Medication List    STOP taking these medications   valACYclovir 500 MG tablet Commonly known as:  VALTREX     TAKE these medications   aspirin EC 81 MG tablet Take 81 mg by mouth daily.   CLARITIN PO Take 1 tablet by mouth daily.   glucose blood test strip Use as instructed for testing four times daily.   ibuprofen 600 MG tablet Commonly known as:  ADVIL,MOTRIN Take 1 tablet (600 mg total) by mouth every 6 (six) hours for 60 doses.   imiquimod 5 % cream Commonly known as:  ALDARA Apply topically 3 (three) times a week. Apply until total clearance or maximum of 16 weeks   ondansetron 4 MG disintegrating tablet Commonly known as:  ZOFRAN-ODT Take by mouth.   onetouch ultrasoft lancets Use as instructed   oxyCODONE 5 MG immediate release tablet Commonly known as:  ROXICODONE Take 1 tablet (5 mg total) by mouth every 8 (eight) hours as needed for up to 20 doses.   PREPLUS 27-1 MG Tabs Take 1 tablet by mouth daily.       Diet: carb modified diet  Activity: Advance as tolerated. Pelvic rest for 6 weeks.   Outpatient follow up:2 weeks Follow up Appt: Future Appointments  Date Time Provider [place]  07/30/2017 10:00 AM Truett Mainland, DO [facility] None   08/27/2017 10:15 AM Nehemiah Settle Tanna Savoy, DO [facility] None   Follow up Visit:No Follow-up on file.  Postpartum contraception: Progesterone only pills  Newborn Data: Live born female  Birth Weight: 7 lb 4.9 oz (3315 g) APGAR: 9, 9  Newborn Delivery   Birth date/time:  07/15/2017 19:33:00 Delivery type:  C-Section, Low Transverse C-section categorization:  Repeat     Baby Feeding: Breast Disposition:home with mother   07/17/2017 Sherene Sires, DO   I spoke with and examined patient and agree with resident/PA/SNM's note and plan of care.  Nigel Berthold, CNM 07/22/2017 8:55 AM

## 2017-07-18 LAB — RH IG WORKUP (INCLUDES ABO/RH)
ABO/RH(D): O NEG
ANTIBODY SCREEN: NEGATIVE
FETAL SCREEN: NEGATIVE
Gestational Age(Wks): 40.1
Unit division: 0

## 2017-07-30 ENCOUNTER — Ambulatory Visit: Payer: PRIVATE HEALTH INSURANCE | Admitting: Family Medicine

## 2017-07-30 ENCOUNTER — Encounter: Payer: Self-pay | Admitting: Family Medicine

## 2017-07-30 VITALS — BP 132/88 | HR 98 | Ht 65.0 in | Wt 288.0 lb

## 2017-07-30 DIAGNOSIS — O10919 Unspecified pre-existing hypertension complicating pregnancy, unspecified trimester: Secondary | ICD-10-CM

## 2017-07-30 DIAGNOSIS — Z5189 Encounter for other specified aftercare: Secondary | ICD-10-CM

## 2017-07-30 MED ORDER — NORETHINDRONE 0.35 MG PO TABS: 1.0000 | ORAL_TABLET | Freq: Every day | ORAL | 11 refills | Status: DC

## 2017-07-30 NOTE — Progress Notes (Signed)
   Subjective:    Patient ID: Courtney Mooney, female    DOB: Jan 15, 1986, 31 y.o.   MRN: [id number]  HPI Patient seen for wound check. She is 2 weeks postpartum from cesarean delivery. She is doing well with no complaints. Some soreness and discomfort, but no problems with incision   Review of Systems    BP 132/88   Pulse 98   Ht 5\' 5"  (1.651 m)   Wt 288 lb (130.6 kg)   BMI 47.93 kg/m   Objective:   Physical Exam  Constitutional: She appears well-developed and well-nourished.  HENT:  Head: Normocephalic and atraumatic.  Right Ear: External ear normal.  Left Ear: External ear normal.  Eyes: Pupils are equal, round, and reactive to light.  Abdominal: Soft. She exhibits no distension. There is tenderness (mild general tenderness around incision). There is no rebound and no guarding.  Incision clean, dry, intact. No erythema  Skin: Skin is warm and dry.  Psychiatric: She has a normal mood and affect. Her behavior is normal. Judgment and thought content normal.      Assessment & Plan:  1. Visit for wound check Wound precautions given. Follow up for PP visit  2. Chronic hypertension during pregnancy, antepartum BP controlled

## 2017-07-30 NOTE — Progress Notes (Signed)
Patient presents for two week incision check. Baby feeding by both breast and bottle.  [place] PP screening score:8

## 2017-08-27 ENCOUNTER — Ambulatory Visit (INDEPENDENT_AMBULATORY_CARE_PROVIDER_SITE_OTHER): Payer: PRIVATE HEALTH INSURANCE | Admitting: Family Medicine

## 2017-08-27 DIAGNOSIS — Z1389 Encounter for screening for other disorder: Secondary | ICD-10-CM | POA: Diagnosis not present

## 2017-08-27 DIAGNOSIS — O10919 Unspecified pre-existing hypertension complicating pregnancy, unspecified trimester: Secondary | ICD-10-CM

## 2017-08-27 NOTE — Progress Notes (Signed)
Post Partum Exam  Courtney Mooney is a 32 y.o. [phone] female who presents for a postpartum visit. She is 6 weeks postpartum following a low cervical transverse Cesarean section. I have fully reviewed the prenatal and intrapartum course. The delivery was at 40 gestational weeks.  Anesthesia: spinal. Postpartum course has been doing well. Baby's course has been doing well. Baby is feeding by breast. Bleeding staining only. Bowel function is normal. Bladder function is normal. Patient is sexually active. Contraception method is oral progesterone-only contraceptive.  Postpartum depression screening: score 5.  The following portions of the patient's history were reviewed and updated as appropriate: allergies, current medications, past family history, past medical history, past social history, past surgical history and problem list. Last pap smear done 11/2016 and was Normal  Review of Systems Pertinent items noted in HPI and remainder of comprehensive ROS otherwise negative.    BP 124/88   Pulse 78   Wt 287 lb (130.2 kg)   Breastfeeding? Yes   BMI 47.76 kg/m    Objective:  Weight 287 lb (130.2 kg), unknown if currently breastfeeding.  General:  alert, cooperative and no distress  Lungs: clear to auscultation bilaterally  Heart:  regular rate and rhythm, S1, S2 normal, no murmur, click, rub or gallop  Abdomen: soft, non-tender; bowel sounds normal; no masses,  no organomegaly. Incision clean, dry, intact. Well healed.        Assessment:    normal postpartum exam. Pap smear not done at today's visit.   Plan:   1. Contraception: oral progesterone-only contraceptive 2. Follow up in: 1 year or as needed.

## 2018-08-17 ENCOUNTER — Other Ambulatory Visit: Payer: Self-pay

## 2018-08-17 DIAGNOSIS — Z3041 Encounter for surveillance of contraceptive pills: Secondary | ICD-10-CM

## 2018-08-17 MED ORDER — NORETHINDRONE 0.35 MG PO TABS: 1.0000 | ORAL_TABLET | Freq: Every day | ORAL | 4 refills | Status: DC

## 2018-08-17 NOTE — Progress Notes (Signed)
Pt sent Mychart message requesting a refill of OCPs. Refill was sent to pt's pharmacy.

## 2018-11-17 ENCOUNTER — Telehealth: Payer: Self-pay

## 2018-11-17 DIAGNOSIS — Z3041 Encounter for surveillance of contraceptive pills: Secondary | ICD-10-CM

## 2018-11-17 MED ORDER — NORETHINDRONE 0.35 MG PO TABS: 1.0000 | ORAL_TABLET | Freq: Every day | ORAL | 1 refills | Status: DC

## 2018-11-17 NOTE — Telephone Encounter (Signed)
Patient called because she picked up her last birth control prescription. She has an annual that has been delayed due to Covid 19. Will send in refills to get her until her appointment. Kathrene Alu RN

## 2019-01-20 ENCOUNTER — Encounter: Payer: Self-pay | Admitting: Family Medicine

## 2019-01-20 ENCOUNTER — Other Ambulatory Visit: Payer: Self-pay

## 2019-01-20 ENCOUNTER — Ambulatory Visit (INDEPENDENT_AMBULATORY_CARE_PROVIDER_SITE_OTHER): Payer: 59 | Admitting: Family Medicine

## 2019-01-20 VITALS — BP 145/91 | HR 79 | Ht 65.0 in | Wt 314.1 lb

## 2019-01-20 DIAGNOSIS — Z01419 Encounter for gynecological examination (general) (routine) without abnormal findings: Secondary | ICD-10-CM

## 2019-01-20 DIAGNOSIS — Z113 Encounter for screening for infections with a predominantly sexual mode of transmission: Secondary | ICD-10-CM

## 2019-01-20 DIAGNOSIS — N926 Irregular menstruation, unspecified: Secondary | ICD-10-CM

## 2019-01-20 DIAGNOSIS — Z3202 Encounter for pregnancy test, result negative: Secondary | ICD-10-CM | POA: Diagnosis not present

## 2019-01-20 LAB — POCT URINE PREGNANCY: Preg Test, Ur: NEGATIVE

## 2019-01-20 NOTE — Progress Notes (Signed)
GYNECOLOGY ANNUAL PREVENTATIVE CARE ENCOUNTER NOTE  Subjective:   Courtney Mooney is a 33 y.o. [phone] female here for a routine annual gynecologic exam.  Current complaints: Had some spotting in middle of last cycle.   Denies abnormal vaginal bleeding, discharge, pelvic pain, problems with intercourse or other gynecologic concerns.    Gynecologic History Patient's last menstrual period was 12/24/2018. Patient is sexually active  Contraception: oral progesterone-only contraceptive Last Pap: 2018. Results were: normal Last mammogram: n/a.   Obstetric History OB History  Gravida Para Term Preterm AB Living  3 2 2   1 2   SAB TAB Ectopic Multiple Live Births  1     0 2    # Outcome Date GA Lbr Len/2nd Weight Sex Delivery Anes PTL Lv  3 Term 07/15/17 [redacted]w[redacted]d  7 lb 4.9 oz (3.315 kg) F CS-LTranv Spinal  LIV  2 Term      CS-LTranv Spinal  LIV  1 SAB             Past Medical History:  Diagnosis Date  . Anemia   . HSV-2 (herpes simplex virus 2) infection   . Hypertension   . PCOS (polycystic ovarian syndrome)   . Vaginal Pap smear, abnormal     Past Surgical History:  Procedure Laterality Date  . CESAREAN SECTION    . CESAREAN SECTION N/A 07/15/2017   Procedure: CESAREAN SECTION;  Surgeon: Jonnie Kind, MD;  Location: [facility];  Service: Obstetrics;  Laterality: N/A;  . CHOLECYSTECTOMY      Current Outpatient Medications on File Prior to Visit  Medication Sig Dispense Refill  . Loratadine (CLARITIN PO) Take 1 tablet by mouth daily.     . norethindrone (MICRONOR) 0.35 MG tablet Take 1 tablet (0.35 mg total) by mouth daily. 3 Package 1  . aspirin EC 81 MG tablet Take 81 mg by mouth daily.    . Prenatal Vit-Fe Fumarate-FA (PREPLUS) 27-1 MG TABS Take 1 tablet by mouth daily. (Patient not taking: Reported on 01/20/2019) 30 tablet 13   Current Facility-Administered Medications on File Prior to Visit  Medication Dose Route Frequency Provider Last Rate Last Dose  .  aspirin chewable tablet 81 mg  81 mg Oral Daily Lavonia Drafts, MD      . Rho D Immune Globulin SOSY 1,500 Units  1,500 Units Intramuscular Once Lavonia Drafts, MD        No Known Allergies  Social History   Socioeconomic History  . Marital status: Married    Spouse name: Not on file  . Number of children: Not on file  . Years of education: Not on file  . Highest education level: Not on file  Occupational History  . Not on file  Social Needs  . Financial resource strain: Not on file  . Food insecurity    Worry: Not on file    Inability: Not on file  . Transportation needs    Medical: Not on file    Non-medical: Not on file  Tobacco Use  . Smoking status: Never Smoker  . Smokeless tobacco: Never Used  Substance and Sexual Activity  . Alcohol use: No  . Drug use: No  . Sexual activity: Yes    Birth control/protection: None  Lifestyle  . Physical activity    Days per week: Not on file    Minutes per session: Not on file  . Stress: Not on file  Relationships  . Social Herbalist on  phone: Not on file    Gets together: Not on file    Attends religious service: Not on file    Active member of club or organization: Not on file    Attends meetings of clubs or organizations: Not on file    Relationship status: Not on file  . Intimate partner violence    Fear of current or ex partner: Not on file    Emotionally abused: Not on file    Physically abused: Not on file    Forced sexual activity: Not on file  Other Topics Concern  . Not on file  Social History Narrative  . Not on file    Family History  Problem Relation Age of Onset  . Diabetes Father   . Colon cancer Maternal Grandmother   . Diabetes Maternal Grandmother   . Deep vein thrombosis Maternal Grandmother   . Pulmonary embolism Maternal Grandmother     The following portions of the patient's history were reviewed and updated as appropriate: allergies, current medications, past  family history, past medical history, past social history, past surgical history and problem list.  Review of Systems Pertinent items are noted in HPI.   Objective:  BP (!) 145/91   Pulse 79   Ht 5\' 5"  (1.651 m)   Wt (!) 314 lb 1.3 oz (142.5 kg)   LMP 12/24/2018   BMI 52.27 kg/m  Wt Readings from Last 3 Encounters:  01/20/19 (!) 314 lb 1.3 oz (142.5 kg)  08/27/17 287 lb (130.2 kg)  07/30/17 288 lb (130.6 kg)     CONSTITUTIONAL: Well-developed, well-nourished female in no acute distress.  HENT:  Normocephalic, atraumatic, External right and left ear normal. Oropharynx is clear and moist EYES: Conjunctivae and EOM are normal. Pupils are equal, round, and reactive to light. No scleral icterus.  NECK: Normal range of motion, supple, no masses.  Normal thyroid.   CARDIOVASCULAR: Normal heart rate noted, regular rhythm RESPIRATORY: Clear to auscultation bilaterally. Effort and breath sounds normal, no problems with respiration noted. BREASTS: Symmetric in size. No masses, skin changes, nipple drainage, or lymphadenopathy. ABDOMEN: Soft, normal bowel sounds, no distention noted.  No tenderness, rebound or guarding.  PELVIC: Normal appearing external genitalia; normal appearing vaginal mucosa and cervix.  No abnormal discharge noted.  Normal uterine size, no other palpable masses, no uterine or adnexal tenderness. MUSCULOSKELETAL: Normal range of motion. No tenderness.  No cyanosis, clubbing, or edema.  2+ distal pulses. SKIN: Skin is warm and dry. No rash noted. Not diaphoretic. No erythema. No pallor. NEUROLOGIC: Alert and oriented to person, place, and time. Normal reflexes, muscle tone coordination. No cranial nerve deficit noted. PSYCHIATRIC: Normal mood and affect. Normal behavior. Normal judgment and thought content.  Assessment:  Annual gynecologic examination with pap smear   Plan:  1. Irregular periods Break through bleeding - will watch - POCT urine pregnancy  2. Well  woman exam with routine gynecological exam PAP today. Check STD. - Cytology - PAP( [place]) - HIV antibody (with reflex) - Hepatitis B Surface AntiGEN - Hepatitis C Antibody - RPR  3. Screening for STD (sexually transmitted disease) - HIV antibody (with reflex) - Hepatitis B Surface AntiGEN - Hepatitis C Antibody - RPR  4. Obesity, morbid, BMI 50 or higher ([place]) Referral to obesity medicine   Routine preventative health maintenance measures emphasized. Please refer to After Visit Summary for other counseling recommendations.    Loma Boston, [place] for [employer]

## 2019-01-21 LAB — CYTOLOGY - PAP
Chlamydia: NEGATIVE
Diagnosis: NEGATIVE
Neisseria Gonorrhea: NEGATIVE

## 2019-01-21 LAB — HEPATITIS C ANTIBODY: Hep C Virus Ab: <0.1 s/co ratio (ref 0.0–0.9)

## 2019-01-21 LAB — HEPATITIS B SURFACE ANTIGEN: Hepatitis B Surface Ag: NEGATIVE

## 2019-01-21 LAB — HIV ANTIBODY (ROUTINE TESTING W REFLEX): HIV Screen 4th Generation wRfx: NONREACTIVE

## 2019-01-21 LAB — RPR: RPR Ser Ql: NONREACTIVE

## 2019-01-25 MED ORDER — METRONIDAZOLE 500 MG PO TABS
500.0000 mg | ORAL_TABLET | Freq: Two times a day (BID) | ORAL | 0 refills | Status: DC
Start: 2019-01-25 — End: 2019-05-02

## 2019-04-26 ENCOUNTER — Telehealth: Payer: Self-pay | Admitting: General Practice

## 2019-04-26 NOTE — Telephone Encounter (Signed)
Called and left vm for patient..please see appointment note for visit on 05/02/2019

## 2019-05-02 ENCOUNTER — Encounter: Payer: Self-pay | Admitting: Family Medicine

## 2019-05-02 ENCOUNTER — Other Ambulatory Visit: Payer: Self-pay

## 2019-05-02 ENCOUNTER — Ambulatory Visit (INDEPENDENT_AMBULATORY_CARE_PROVIDER_SITE_OTHER): Payer: 59 | Admitting: Family Medicine

## 2019-05-02 VITALS — BP 128/80 | HR 96 | Ht 65.0 in | Wt 303.5 lb

## 2019-05-02 DIAGNOSIS — Z23 Encounter for immunization: Secondary | ICD-10-CM | POA: Insufficient documentation

## 2019-05-02 DIAGNOSIS — Z0000 Encounter for general adult medical examination without abnormal findings: Secondary | ICD-10-CM | POA: Diagnosis not present

## 2019-05-02 DIAGNOSIS — Z6841 Body Mass Index (BMI) 40.0 and over, adult: Secondary | ICD-10-CM | POA: Insufficient documentation

## 2019-05-02 DIAGNOSIS — E559 Vitamin D deficiency, unspecified: Secondary | ICD-10-CM

## 2019-05-02 DIAGNOSIS — D72829 Elevated white blood cell count, unspecified: Secondary | ICD-10-CM

## 2019-05-02 LAB — VITAMIN D 25 HYDROXY (VIT D DEFICIENCY, FRACTURES): VITD: 11.66 ng/mL — ABNORMAL LOW (ref 30.00–100.00)

## 2019-05-02 LAB — COMPREHENSIVE METABOLIC PANEL
ALT: 10 U/L (ref 0–35)
AST: 13 U/L (ref 0–37)
Albumin: 3.8 g/dL (ref 3.5–5.2)
Alkaline Phosphatase: 103 U/L (ref 39–117)
BUN: 16 mg/dL (ref 6–23)
CO2: 29 mEq/L (ref 19–32)
Calcium: 8.9 mg/dL (ref 8.4–10.5)
Chloride: 103 mEq/L (ref 96–112)
Creatinine, Ser: 0.78 mg/dL (ref 0.40–1.20)
GFR: 101.64 mL/min (ref >60.00)
Glucose, Bld: 98 mg/dL (ref 70–99)
Potassium: 3.9 mEq/L (ref 3.5–5.1)
Sodium: 139 mEq/L (ref 135–145)
Total Bilirubin: 0.3 mg/dL (ref 0.2–1.2)
Total Protein: 7.3 g/dL (ref 6.0–8.3)

## 2019-05-02 LAB — LIPID PANEL
Cholesterol: 145 mg/dL (ref 0–200)
HDL: 34 mg/dL — ABNORMAL LOW (ref >39.00)
LDL Cholesterol: 102 mg/dL — ABNORMAL HIGH (ref 0–99)
NonHDL: 110.86
Total CHOL/HDL Ratio: 4
Triglycerides: 42 mg/dL (ref 0.0–149.0)
VLDL: 8.4 mg/dL (ref 0.0–40.0)

## 2019-05-02 LAB — URINALYSIS, ROUTINE W REFLEX MICROSCOPIC
Bilirubin Urine: NEGATIVE
Hgb urine dipstick: NEGATIVE
Ketones, ur: NEGATIVE
Leukocytes,Ua: NEGATIVE
Nitrite: NEGATIVE
RBC / HPF: NONE SEEN (ref 0–?)
Specific Gravity, Urine: 1.015 (ref 1.000–1.030)
Total Protein, Urine: NEGATIVE
Urine Glucose: NEGATIVE
Urobilinogen, UA: 0.2 (ref 0.0–1.0)
pH: 7 (ref 5.0–8.0)

## 2019-05-02 LAB — CBC
HCT: 40.6 % (ref 36.0–46.0)
Hemoglobin: 12.9 g/dL (ref 12.0–15.0)
MCHC: 31.8 g/dL (ref 30.0–36.0)
MCV: 84.4 fl (ref 78.0–100.0)
Platelets: 411 10*3/uL — ABNORMAL HIGH (ref 150.0–400.0)
RBC: 4.81 Mil/uL (ref 3.87–5.11)
RDW: 14 % (ref 11.5–15.5)
WBC: 13.4 10*3/uL — ABNORMAL HIGH (ref 4.0–10.5)

## 2019-05-02 LAB — TSH: TSH: 1.3 u[IU]/mL (ref 0.35–4.50)

## 2019-05-02 LAB — HEMOGLOBIN A1C: Hgb A1c MFr Bld: 6.3 % (ref 4.6–6.5)

## 2019-05-02 NOTE — Progress Notes (Addendum)
Established Patient Office Visit  Subjective:  Patient ID: Courtney Mooney, female    DOB: 10-12-1985  Age: 33 y.o. MRN: [id number]  CC:  Chief Complaint  Patient presents with  . Annual Exam    HPI [place] presents for establishment of care and a physical exam.  Her primary concern today is her obesity.  She has struggled with her weight throughout her life.  She has tried weight watchers and has been able to lose some weight but invariably it returns.  She is not been in a formal medical weight loss program but has been in contact with the bariatric surgeon.  She is relatively healthy as far she knows.  She has been treated for hypertension in the past but her blood pressure has normalized over the recent years.  She has a history of herpes genitalis since 2009 but rarely has outbreaks.  She is seeing the GYN doctor for her female care.  She had a Pap smear this year.  She is stressed because she is currently going through a separation.  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  She does not smoke drink or use illicit drugs.  Past Medical History:  Diagnosis Date  . Anemia   . HSV-2 (herpes simplex virus 2) infection   . Hypertension   . PCOS (polycystic ovarian syndrome)   . Vaginal Pap smear, abnormal     Past Surgical History:  Procedure Laterality Date  . CESAREAN SECTION    . CESAREAN SECTION N/A 07/15/2017   Procedure: CESAREAN SECTION;  Surgeon: Jonnie Kind, MD;  Location: [facility];  Service: Obstetrics;  Laterality: N/A;  . CHOLECYSTECTOMY      Family History  Problem Relation Age of Onset  . Diabetes Father   . Colon cancer Maternal Grandmother   . Diabetes Maternal Grandmother   . Deep vein thrombosis Maternal Grandmother   . Pulmonary embolism Maternal Grandmother     Social History   Socioeconomic History  . Marital status: Legally Separated    Spouse name: Not on file  . Number of children: Not on file  . Years  of education: Not on file  . Highest education level: Not on file  Occupational History  . Not on file  Social Needs  . Financial resource strain: Not on file  . Food insecurity    Worry: Not on file    Inability: Not on file  . Transportation needs    Medical: Not on file    Non-medical: Not on file  Tobacco Use  . Smoking status: Never Smoker  . Smokeless tobacco: Never Used  Substance and Sexual Activity  . Alcohol use: No  . Drug use: No  . Sexual activity: Yes    Birth control/protection: None  Lifestyle  . Physical activity    Days per week: Not on file    Minutes per session: Not on file  . Stress: Not on file  Relationships  . Social Herbalist on phone: Not on file    Gets together: Not on file    Attends religious service: Not on file    Active member of club or organization: Not on file    Attends meetings of clubs or organizations: Not on file    Relationship status: Not on file  . Intimate partner violence    Fear of current or ex partner: Not on file    Emotionally abused: Not on file  Physically abused: Not on file    Forced sexual activity: Not on file  Other Topics Concern  . Not on file  Social History Narrative  . Not on file    Outpatient Medications Prior to Visit  Medication Sig Dispense Refill  . Loratadine (CLARITIN PO) Take 1 tablet by mouth daily.     . norethindrone (MICRONOR) 0.35 MG tablet Take 1 tablet (0.35 mg total) by mouth daily. 3 Package 1  . aspirin EC 81 MG tablet Take 81 mg by mouth daily.    . metroNIDAZOLE (FLAGYL) 500 MG tablet Take 1 tablet (500 mg total) by mouth 2 (two) times daily. 14 tablet 0  . Prenatal Vit-Fe Fumarate-FA (PREPLUS) 27-1 MG TABS Take 1 tablet by mouth daily. (Patient not taking: Reported on 01/20/2019) 30 tablet 13   Facility-Administered Medications Prior to Visit  Medication Dose Route Frequency Provider Last Rate Last Dose  . aspirin chewable tablet 81 mg  81 mg Oral Daily  Lavonia Drafts, MD      . Rho D Immune Globulin SOSY 1,500 Units  1,500 Units Intramuscular Once Lavonia Drafts, MD        No Known Allergies  ROS Review of Systems  Constitutional: Negative.   HENT: Negative.   Eyes: Negative for photophobia and visual disturbance.  Respiratory: Negative.   Cardiovascular: Negative.   Gastrointestinal: Negative.   Endocrine: Negative for polyphagia and polyuria.  Genitourinary: Negative.   Musculoskeletal: Negative for gait problem and joint swelling.  Skin: Negative for pallor and rash.  Allergic/Immunologic: Negative for immunocompromised state.  Neurological: Negative for tremors and speech difficulty.  Hematological: Does not bruise/bleed easily.  Psychiatric/Behavioral: Negative.       Objective:    Physical Exam  Constitutional: She is oriented to person, place, and time. She appears well-developed and well-nourished. No distress.  HENT:  Head: Normocephalic and atraumatic.  Right Ear: External ear normal.  Left Ear: External ear normal.  Mouth/Throat: Oropharynx is clear and moist. No oropharyngeal exudate.  Eyes: Pupils are equal, round, and reactive to light. Conjunctivae are normal. Right eye exhibits no discharge. Left eye exhibits no discharge. No scleral icterus.  Neck: Neck supple. No JVD present. No tracheal deviation present. No thyromegaly present.  Cardiovascular: Normal rate, regular rhythm and normal heart sounds.  Pulmonary/Chest: Effort normal and breath sounds normal. No stridor.  Abdominal: Bowel sounds are normal.  Musculoskeletal:        General: No edema.  Lymphadenopathy:    She has no cervical adenopathy.  Neurological: She is alert and oriented to person, place, and time.  Skin: Skin is warm and dry. She is not diaphoretic.  Psychiatric: She has a normal mood and affect. Her behavior is normal.    BP 128/80   Pulse 96   Ht 5\' 5"  (1.651 m)   Wt (!) 303 lb 8 oz (137.7 kg)   SpO2 98%    BMI 50.51 kg/m  Wt Readings from Last 3 Encounters:  05/02/19 (!) 303 lb 8 oz (137.7 kg)  01/20/19 (!) 314 lb 1.3 oz (142.5 kg)  08/27/17 287 lb (130.2 kg)   BP Readings from Last 3 Encounters:  05/02/19 128/80  01/20/19 (!) 145/91  08/27/17 124/88   Guideline developer:  UpToDate (see UpToDate for funding source) Date Released: June 2014  There are no preventive care reminders to display for this patient.  There are no preventive care reminders to display for this patient.  Lab Results  Component Value Date  TSH 1.30 05/02/2019   Lab Results  Component Value Date   WBC 13.4 (H) 05/02/2019   HGB 12.9 05/02/2019   HCT 40.6 05/02/2019   MCV 84.4 05/02/2019   PLT 411.0 (H) 05/02/2019   Lab Results  Component Value Date   NA 139 05/02/2019   K 3.9 05/02/2019   CO2 29 05/02/2019   GLUCOSE 98 05/02/2019   BUN 16 05/02/2019   CREATININE 0.78 05/02/2019   BILITOT 0.3 05/02/2019   ALKPHOS 103 05/02/2019   AST 13 05/02/2019   ALT 10 05/02/2019   PROT 7.3 05/02/2019   ALBUMIN 3.8 05/02/2019   CALCIUM 8.9 05/02/2019   ANIONGAP 9 07/15/2017   GFR 101.64 05/02/2019   Lab Results  Component Value Date   CHOL 145 05/02/2019   Lab Results  Component Value Date   HDL 34.00 (L) 05/02/2019   Lab Results  Component Value Date   LDLCALC 102 (H) 05/02/2019   Lab Results  Component Value Date   TRIG 42.0 05/02/2019   Lab Results  Component Value Date   CHOLHDL 4 05/02/2019   Lab Results  Component Value Date   HGBA1C 6.3 05/02/2019      Assessment & Plan:   Problem List Items Addressed This Visit      Other   Healthcare maintenance - Primary   Relevant Orders   CBC (Completed)   Comprehensive metabolic panel (Completed)   Hemoglobin A1c (Completed)   TSH (Completed)   VITAMIN D 25 Hydroxy (Vit-D Deficiency, Fractures) (Completed)   Urinalysis, Routine w reflex microscopic (Completed)   Lipid panel (Completed)   HIV Antibody (routine testing w rflx)  (Completed)   Class 3 severe obesity due to excess calories with body mass index (BMI) of 50.0 to 59.9 in adult [facility])   Relevant Orders   Amb Ref to Medical Weight Management   Need for influenza vaccination   Relevant Orders   Flu Vaccine QUAD 36+ mos IM (Completed)   Vitamin D deficiency   Relevant Medications   Vitamin D, Ergocalciferol, (DRISDOL) 1.25 MG (50000 UT) CAPS capsule   Leukocytosis   Relevant Orders   Ambulatory referral to Hematology      Meds ordered this encounter  Medications  . Vitamin D, Ergocalciferol, (DRISDOL) 1.25 MG (50000 UT) CAPS capsule    Sig: Take 1 capsule (50,000 Units total) by mouth every 7 (seven) days.    Dispense:  5 capsule    Refill:  2    Follow-up: Return in about 1 year (around 05/01/2020), or if symptoms worsen or fail to improve.   Patient was given information on health maintenance and disease prevention as well as exercising to lose weight.  She is interested in bariatric surgery and we will go ahead and refer her to medical weight loss management to help her proceed in that direction. 10/7 addendum: patient with ongoing leukocytosis.

## 2019-05-02 NOTE — Patient Instructions (Signed)
Exercising to Lose Weight Exercise is structured, repetitive physical activity to improve fitness and health. Getting regular exercise is important for everyone. It is especially important if you are overweight. Being overweight increases your risk of heart disease, stroke, diabetes, high blood pressure, and several types of cancer. Reducing your calorie intake and exercising can help you lose weight. Exercise is usually categorized as moderate or vigorous intensity. To lose weight, most people need to do a certain amount of moderate-intensity or vigorous-intensity exercise each week. Moderate-intensity exercise  Moderate-intensity exercise is any activity that gets you moving enough to burn at least three times more energy (calories) than if you were sitting. Examples of moderate exercise include:  Walking a mile in 15 minutes.  Doing light yard work.  Biking at an easy pace. Most people should get at least 150 minutes (2 hours and 30 minutes) a week of moderate-intensity exercise to maintain their body weight. Vigorous-intensity exercise Vigorous-intensity exercise is any activity that gets you moving enough to burn at least six times more calories than if you were sitting. When you exercise at this intensity, you should be working hard enough that you are not able to carry on a conversation. Examples of vigorous exercise include:  Running.  Playing a team sport, such as football, basketball, and soccer.  Jumping rope. Most people should get at least 75 minutes (1 hour and 15 minutes) a week of vigorous-intensity exercise to maintain their body weight. How can exercise affect me? When you exercise enough to burn more calories than you eat, you lose weight. Exercise also reduces body fat and builds muscle. The more muscle you have, the more calories you burn. Exercise also:  Improves mood.  Reduces stress and tension.  Improves your overall fitness, flexibility, and  endurance.  Increases bone strength. The amount of exercise you need to lose weight depends on:  Your age.  The type of exercise.  Any health conditions you have.  Your overall physical ability. Talk to your health care provider about how much exercise you need and what types of activities are safe for you. What actions can I take to lose weight? Nutrition   Make changes to your diet as told by your health care provider or diet and nutrition specialist (dietitian). This may include: ? Eating fewer calories. ? Eating more protein. ? Eating less unhealthy fats. ? Eating a diet that includes fresh fruits and vegetables, whole grains, low-fat dairy products, and lean protein. ? Avoiding foods with added fat, salt, and sugar.  Drink plenty of water while you exercise to prevent dehydration or heat stroke. Activity  Choose an activity that you enjoy and set realistic goals. Your health care provider can help you make an exercise plan that works for you.  Exercise at a moderate or vigorous intensity most days of the week. ? The intensity of exercise may vary from person to person. You can tell how intense a workout is for you by paying attention to your breathing and heartbeat. Most people will notice their breathing and heartbeat get faster with more intense exercise.  Do resistance training twice each week, such as: ? Push-ups. ? Sit-ups. ? Lifting weights. ? Using resistance bands.  Getting short amounts of exercise can be just as helpful as long structured periods of exercise. If you have trouble finding time to exercise, try to include exercise in your daily routine. ? Get up, stretch, and walk around every 30 minutes throughout the day. ? Go for a  walk during your lunch break. ? Park your car farther away from your destination. ? If you take public transportation, get off one stop early and walk the rest of the way. ? Make phone calls while standing up and walking  around. ? Take the stairs instead of elevators or escalators.  Wear comfortable clothes and shoes with good support.  Do not exercise so much that you hurt yourself, feel dizzy, or get very short of breath. Where to find more information  U.S. Department of Health and Human Services: [employer]at  Centers for Disease Control and Prevention (CDC): http://www.wolf.info/ Contact a health care provider:  Before starting a new exercise program.  If you have questions or concerns about your weight.  If you have a medical problem that keeps you from exercising. Get help right away if you have any of the following while exercising:  Injury.  Dizziness.  Difficulty breathing or shortness of breath that does not go away when you stop exercising.  Chest pain.  Rapid heartbeat. Summary  Being overweight increases your risk of heart disease, stroke, diabetes, high blood pressure, and several types of cancer.  Losing weight happens when you burn more calories than you eat.  Reducing the amount of calories you eat in addition to getting regular moderate or vigorous exercise each week helps you lose weight. This information is not intended to replace advice given to you by your health care provider. Make sure you discuss any questions you have with your health care provider. Document Released: 08/16/2010 Document Revised: 07/27/2017 Document Reviewed: 07/27/2017 Elsevier Patient Education  2020 [place] Maintenance, Female Adopting a healthy lifestyle and getting preventive care are important in promoting health and wellness. Ask your health care provider about:  The right schedule for you to have regular tests and exams.  Things you can do on your own to prevent diseases and keep yourself healthy. What should I know about diet, weight, and exercise? Eat a healthy diet   Eat a diet that includes plenty of vegetables, fruits, low-fat dairy products, and lean protein.  Do not eat  a lot of foods that are high in solid fats, added sugars, or sodium. Maintain a healthy weight Body mass index (BMI) is used to identify weight problems. It estimates body fat based on height and weight. Your health care provider can help determine your BMI and help you achieve or maintain a healthy weight. Get regular exercise Get regular exercise. This is one of the most important things you can do for your health. Most adults should:  Exercise for at least 150 minutes each week. The exercise should increase your heart rate and make you sweat (moderate-intensity exercise).  Do strengthening exercises at least twice a week. This is in addition to the moderate-intensity exercise.  Spend less time sitting. Even light physical activity can be beneficial. Watch cholesterol and blood lipids Have your blood tested for lipids and cholesterol at 35 years of age, then have this test every 5 years. Have your cholesterol levels checked more often if:  Your lipid or cholesterol levels are high.  You are older than 35 years of age.  You are at high risk for heart disease. What should I know about cancer screening? Depending on your health history and family history, you may need to have cancer screening at various ages. This may include screening for:  Breast cancer.  Cervical cancer.  Colorectal cancer.  Skin cancer.  Lung cancer. What should I know about  heart disease, diabetes, and high blood pressure? Blood pressure and heart disease  High blood pressure causes heart disease and increases the risk of stroke. This is more likely to develop in people who have high blood pressure readings, are of African descent, or are overweight.  Have your blood pressure checked: ? Every 3-5 years if you are 59-37 years of age. ? Every year if you are 53 years old or older. Diabetes Have regular diabetes screenings. This checks your fasting blood sugar level. Have the screening done:  Once every  three years after age 5 if you are at a normal weight and have a low risk for diabetes.  More often and at a younger age if you are overweight or have a high risk for diabetes. What should I know about preventing infection? Hepatitis B If you have a higher risk for hepatitis B, you should be screened for this virus. Talk with your health care provider to find out if you are at risk for hepatitis B infection. Hepatitis C Testing is recommended for:  Everyone born from [birth year] through 1967.  Anyone with known risk factors for hepatitis C. Sexually transmitted infections (STIs)  Get screened for STIs, including gonorrhea and chlamydia, if: ? You are sexually active and are younger than 35 years of age. ? You are older than 35 years of age and your health care provider tells you that you are at risk for this type of infection. ? Your sexual activity has changed since you were last screened, and you are at increased risk for chlamydia or gonorrhea. Ask your health care provider if you are at risk.  Ask your health care provider about whether you are at high risk for HIV. Your health care provider may recommend a prescription medicine to help prevent HIV infection. If you choose to take medicine to prevent HIV, you should first get tested for HIV. You should then be tested every 3 months for as long as you are taking the medicine. Pregnancy  If you are about to stop having your period (premenopausal) and you may become pregnant, seek counseling before you get pregnant.  Take 400 to 800 micrograms (mcg) of folic acid every day if you become pregnant.  Ask for birth control (contraception) if you want to prevent pregnancy. Osteoporosis and menopause Osteoporosis is a disease in which the bones lose minerals and strength with aging. This can result in bone fractures. If you are 38 years old or older, or if you are at risk for osteoporosis and fractures, ask your health care provider if you  should:  Be screened for bone loss.  Take a calcium or vitamin D supplement to lower your risk of fractures.  Be given hormone replacement therapy (HRT) to treat symptoms of menopause. Follow these instructions at home: Lifestyle  Do not use any products that contain nicotine or tobacco, such as cigarettes, e-cigarettes, and chewing tobacco. If you need help quitting, ask your health care provider.  Do not use street drugs.  Do not share needles.  Ask your health care provider for help if you need support or information about quitting drugs. Alcohol use  Do not drink alcohol if: ? Your health care provider tells you not to drink. ? You are pregnant, may be pregnant, or are planning to become pregnant.  If you drink alcohol: ? Limit how much you use to 0-1 drink a day. ? Limit intake if you are breastfeeding.  Be aware of how much alcohol is  in your drink. In the [place] one drink equals one 12 oz bottle of beer (355 mL), one 5 oz glass of wine (148 mL), or one 1 oz glass of hard liquor (44 mL). General instructions  Schedule regular health, dental, and eye exams.  Stay current with your vaccines.  Tell your health care provider if: ? You often feel depressed. ? You have ever been abused or do not feel safe at home. Summary  Adopting a healthy lifestyle and getting preventive care are important in promoting health and wellness.  Follow your health care provider's instructions about healthy diet, exercising, and getting tested or screened for diseases.  Follow your health care provider's instructions on monitoring your cholesterol and blood pressure. This information is not intended to replace advice given to you by your health care provider. Make sure you discuss any questions you have with your health care provider. Document Released: 01/27/2011 Document Revised: 07/07/2018 Document Reviewed: 07/07/2018 Elsevier Patient Education  2020 [employer].

## 2019-05-03 LAB — HIV ANTIBODY (ROUTINE TESTING W REFLEX): HIV 1&2 Ab, 4th Generation: NONREACTIVE

## 2019-05-04 ENCOUNTER — Telehealth: Payer: Self-pay | Admitting: Family

## 2019-05-04 DIAGNOSIS — E559 Vitamin D deficiency, unspecified: Secondary | ICD-10-CM | POA: Insufficient documentation

## 2019-05-04 DIAGNOSIS — D72829 Elevated white blood cell count, unspecified: Secondary | ICD-10-CM | POA: Insufficient documentation

## 2019-05-04 MED ORDER — VITAMIN D (ERGOCALCIFEROL) 1.25 MG (50000 UNIT) PO CAPS: 50000.0000 [IU] | ORAL_CAPSULE | ORAL | 2 refills | Status: DC

## 2019-05-04 NOTE — Telephone Encounter (Signed)
Called and LMVM for patient with date/time location for New Patient appointments scheduled/ Letter/Calendar has been mailed as well

## 2019-05-04 NOTE — Addendum Note (Signed)
Addended by: Jon Billings on: 05/04/2019 09:12 AM   Modules accepted: Orders

## 2019-05-05 ENCOUNTER — Other Ambulatory Visit: Payer: Self-pay | Admitting: Family Medicine

## 2019-05-05 ENCOUNTER — Telehealth: Payer: Self-pay | Admitting: Family

## 2019-05-05 DIAGNOSIS — Z3041 Encounter for surveillance of contraceptive pills: Secondary | ICD-10-CM

## 2019-05-05 NOTE — Telephone Encounter (Signed)
Called LMVM for patient about date/time change of appointment for 10/30 due to provider PAL.  I asked her to call back to confirm new date/time

## 2019-05-09 ENCOUNTER — Other Ambulatory Visit: Payer: Self-pay | Admitting: *Deleted

## 2019-05-09 DIAGNOSIS — Z3041 Encounter for surveillance of contraceptive pills: Secondary | ICD-10-CM

## 2019-05-09 MED ORDER — NORETHINDRONE 0.35 MG PO TABS: 1.0000 | ORAL_TABLET | Freq: Every day | ORAL | 1 refills | Status: DC

## 2019-05-09 MED ORDER — NORETHINDRONE 0.35 MG PO TABS: 1.0000 | ORAL_TABLET | Freq: Every day | ORAL | 3 refills | Status: DC

## 2019-05-09 NOTE — Addendum Note (Signed)
Addended by: Truett Mainland on: 05/09/2019 10:59 AM   Modules accepted: Orders

## 2019-05-09 NOTE — Progress Notes (Signed)
Pt called for refill on her BC pills.  Pt is up to date on AEX. Refill was sent as previously prescribed, per protocol.

## 2019-05-10 ENCOUNTER — Other Ambulatory Visit: Payer: Self-pay | Admitting: Family

## 2019-05-10 DIAGNOSIS — D72829 Elevated white blood cell count, unspecified: Secondary | ICD-10-CM

## 2019-05-11 ENCOUNTER — Inpatient Hospital Stay: Payer: 59 | Attending: Hematology & Oncology

## 2019-05-11 ENCOUNTER — Telehealth: Payer: Self-pay | Admitting: Family

## 2019-05-11 ENCOUNTER — Other Ambulatory Visit: Payer: Self-pay

## 2019-05-11 ENCOUNTER — Encounter: Payer: Self-pay | Admitting: Family

## 2019-05-11 ENCOUNTER — Inpatient Hospital Stay (HOSPITAL_BASED_OUTPATIENT_CLINIC_OR_DEPARTMENT_OTHER): Payer: 59 | Admitting: Family

## 2019-05-11 DIAGNOSIS — D72829 Elevated white blood cell count, unspecified: Secondary | ICD-10-CM | POA: Diagnosis present

## 2019-05-11 DIAGNOSIS — R7989 Other specified abnormal findings of blood chemistry: Secondary | ICD-10-CM

## 2019-05-11 DIAGNOSIS — Z800 Family history of malignant neoplasm of digestive organs: Secondary | ICD-10-CM

## 2019-05-11 LAB — CBC WITH DIFFERENTIAL (CANCER CENTER ONLY)
Abs Immature Granulocytes: 0.04 10*3/uL (ref 0.00–0.07)
Basophils Absolute: 0 10*3/uL (ref 0.0–0.1)
Basophils Relative: 0 %
Eosinophils Absolute: 0.1 10*3/uL (ref 0.0–0.5)
Eosinophils Relative: 0 %
HCT: 41.2 % (ref 36.0–46.0)
Hemoglobin: 12.6 g/dL (ref 12.0–15.0)
Immature Granulocytes: 0 %
Lymphocytes Relative: 18 %
Lymphs Abs: 2 10*3/uL (ref 0.7–4.0)
MCH: 26.3 pg (ref 26.0–34.0)
MCHC: 30.6 g/dL (ref 30.0–36.0)
MCV: 86 fL (ref 80.0–100.0)
Monocytes Absolute: 0.6 10*3/uL (ref 0.1–1.0)
Monocytes Relative: 5 %
Neutro Abs: 8.8 10*3/uL — ABNORMAL HIGH (ref 1.7–7.7)
Neutrophils Relative %: 77 %
Platelet Count: 377 10*3/uL (ref 150–400)
RBC: 4.79 MIL/uL (ref 3.87–5.11)
RDW: 14 % (ref 11.5–15.5)
WBC Count: 11.5 10*3/uL — ABNORMAL HIGH (ref 4.0–10.5)
nRBC: 0 % (ref 0.0–0.2)

## 2019-05-11 LAB — CMP (CANCER CENTER ONLY)
ALT: 9 U/L (ref 0–44)
AST: 12 U/L — ABNORMAL LOW (ref 15–41)
Albumin: 3.9 g/dL (ref 3.5–5.0)
Alkaline Phosphatase: 78 U/L (ref 38–126)
Anion gap: 6 (ref 5–15)
BUN: 17 mg/dL (ref 6–20)
CO2: 29 mmol/L (ref 22–32)
Calcium: 8.9 mg/dL (ref 8.9–10.3)
Chloride: 105 mmol/L (ref 98–111)
Creatinine: 0.82 mg/dL (ref 0.44–1.00)
GFR, Est AFR Am: >60 mL/min (ref >60)
GFR, Estimated: >60 mL/min (ref >60)
Glucose, Bld: 104 mg/dL — ABNORMAL HIGH (ref 70–99)
Potassium: 4.2 mmol/L (ref 3.5–5.1)
Sodium: 140 mmol/L (ref 135–145)
Total Bilirubin: 0.4 mg/dL (ref 0.3–1.2)
Total Protein: 7 g/dL (ref 6.5–8.1)

## 2019-05-11 LAB — LACTATE DEHYDROGENASE: LDH: 195 U/L — ABNORMAL HIGH (ref 98–192)

## 2019-05-11 LAB — SAVE SMEAR (SSMR)

## 2019-05-11 NOTE — Telephone Encounter (Signed)
Appointments scheduled letter/calendar mailed as well per 10/15 los

## 2019-05-11 NOTE — Progress Notes (Signed)
Hematology/Oncology Consultation   Name: Courtney Mooney      MRN: [id number]    Location: Room/bed info not found  Date: 05/11/2019 Time:11:18 AM   REFERRING PHYSICIAN: Jon Billings, MD  REASON FOR CONSULT: Leukocytosis    DIAGNOSIS: Mild leukocytosis possibly reactive  HISTORY OF PRESENT ILLNESS: Courtney Mooney is very pleasant 33 yo African American female with at least a 10 year history of leukocytosis.  She denies any issues with frequent infections. No fever, chills, n/v, cough, rash, host flashes, night sweats, dizziness, headaches, vision changes/loss, SOB, chest pain or changes in bowel or bladder habits.  She has abdominal pain at times with constipation and will sometimes have bright red blood in her stool if she strains.  He maternal grandmother was diagnosed with colon cancer early at the age of 46.  No other cancer history in the family that she is aware of. No personal history.  She has occasional palpitations that last a few seconds.  Her cycle is regular and a little heavy at times.  No bruising or petechiae.  Her half sister has the sickle cell trait. No personal history of disease or trait.  She takes a liver focus supplement (detox).  No personal history of diabetes or thyroid disease.  She does not smoke or use recreational drugs.  She will have a glass of wine occasionally with friends.  She has maintained a good appetite and is staying well hydrated. Her weight is described as stable.  She has an appointment with a weight loss doctor next week. She will be starting the program to prepare her for bariatric surgery.  No swelling, tenderness, numbness or tingling in her extremities.  No falls or syncopal episodes.  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   ROS: All other 10 point review of systems is negative.   PAST MEDICAL HISTORY:   Past Medical History:  Diagnosis Date  . Anemia   . HSV-2 (herpes simplex virus 2) infection   .  Hypertension   . PCOS (polycystic ovarian syndrome)   . Vaginal Pap smear, abnormal     ALLERGIES: No Known Allergies    MEDICATIONS:  Current Outpatient Medications on File Prior to Visit  Medication Sig Dispense Refill  . Loratadine (CLARITIN PO) Take 1 tablet by mouth daily.     . norethindrone (MICRONOR) 0.35 MG tablet Take 1 tablet (0.35 mg total) by mouth daily. 3 Package 1  . norethindrone (MICRONOR) 0.35 MG tablet Take 1 tablet (0.35 mg total) by mouth daily. 84 tablet 3  . Vitamin D, Ergocalciferol, (DRISDOL) 1.25 MG (50000 UT) CAPS capsule Take 1 capsule (50,000 Units total) by mouth every 7 (seven) days. 5 capsule 2   Current Facility-Administered Medications on File Prior to Visit  Medication Dose Route Frequency Provider Last Rate Last Dose  . aspirin chewable tablet 81 mg  81 mg Oral Daily Lavonia Drafts, MD      . Rho D Immune Globulin SOSY 1,500 Units  1,500 Units Intramuscular Once Lavonia Drafts, MD         PAST SURGICAL HISTORY Past Surgical History:  Procedure Laterality Date  . CESAREAN SECTION    . CESAREAN SECTION N/A 07/15/2017   Procedure: CESAREAN SECTION;  Surgeon: Jonnie Kind, MD;  Location: [facility];  Service: Obstetrics;  Laterality: N/A;  . CHOLECYSTECTOMY      FAMILY HISTORY: Family History  Problem Relation Age of Onset  . Diabetes Father   . Colon  cancer Maternal Grandmother   . Diabetes Maternal Grandmother   . Deep vein thrombosis Maternal Grandmother   . Pulmonary embolism Maternal Grandmother     SOCIAL HISTORY:  reports that she has never smoked. She has never used smokeless tobacco. She reports that she does not drink alcohol or use drugs.  PERFORMANCE STATUS: The patient's performance status is 0 - Asymptomatic  PHYSICAL EXAM: Most Recent Vital Signs: currently breastfeeding. There were no vitals taken for this visit.  General Appearance:    Alert, cooperative, no distress, appears stated age   Head:    Normocephalic, without obvious abnormality, atraumatic  Eyes:    PERRL, conjunctiva/corneas clear, EOM's intact, fundi    benign, both eyes        Throat:   Lips, mucosa, and tongue normal; teeth and gums normal  Neck:   Supple, symmetrical, trachea midline, no adenopathy;    thyroid:  no enlargement/tenderness/nodules; no carotid   bruit or JVD  Back:     Symmetric, no curvature, ROM normal, no CVA tenderness  Lungs:     Clear to auscultation bilaterally, respirations unlabored  Chest Wall:    No tenderness or deformity   Heart:    Regular rate and rhythm, S1 and S2 normal, no murmur, rub   or gallop     Abdomen:     Soft, non-tender, bowel sounds active all four quadrants,    no masses, no organomegaly        Extremities:   Extremities normal, atraumatic, no cyanosis or edema  Pulses:   2+ and symmetric all extremities  Skin:   Skin color, texture, turgor normal, no rashes or lesions  Lymph nodes:   Cervical, supraclavicular, and axillary nodes normal  Neurologic:   CNII-XII intact, normal strength, sensation and reflexes    throughout    LABORATORY DATA:  Results for orders placed or performed in visit on 05/11/19 (from the past 48 hour(s))  Save Smear (SSMR)     Status: None   Collection Time: 05/11/19 10:24 AM  Result Value Ref Range   Smear Review SMEAR STAINED AND AVAILABLE FOR REVIEW     Comment: Performed at [facility] at [facility], [address], [place], [place] [address]  CMP ([facility] only)     Status: Abnormal   Collection Time: 05/11/19 10:24 AM  Result Value Ref Range   Sodium 140 135 - 145 mmol/L   Potassium 4.2 3.5 - 5.1 mmol/L   Chloride 105 98 - 111 mmol/L   CO2 29 22 - 32 mmol/L   Glucose, Bld 104 (H) 70 - 99 mg/dL   BUN 17 6 - 20 mg/dL   Creatinine 0.82 0.44 - 1.00 mg/dL   Calcium 8.9 8.9 - 10.3 mg/dL   Total Protein 7.0 6.5 - 8.1 g/dL   Albumin 3.9 3.5 - 5.0 g/dL   AST 12 (L) 15 - 41 U/L   ALT 9 0 -  44 U/L   Alkaline Phosphatase 78 38 - 126 U/L   Total Bilirubin 0.4 0.3 - 1.2 mg/dL   GFR, Est Non Af Am >60 >60 mL/min   GFR, Est AFR Am >60 >60 mL/min   Anion gap 6 5 - 15    Comment: Performed at [facility] at [facility], [address] [place], [place] [address]  CBC with Differential (Cancer Center Only)     Status: Abnormal   Collection Time: 05/11/19 10:24  AM  Result Value Ref Range   WBC Count 11.5 (H) 4.0 - 10.5 K/uL   RBC 4.79 3.87 - 5.11 MIL/uL   Hemoglobin 12.6 12.0 - 15.0 g/dL   HCT 41.2 36.0 - 46.0 %   MCV 86.0 80.0 - 100.0 fL   MCH 26.3 26.0 - 34.0 pg   MCHC 30.6 30.0 - 36.0 g/dL   RDW 14.0 11.5 - 15.5 %   Platelet Count 377 150 - 400 K/uL   nRBC 0.0 0.0 - 0.2 %   Neutrophils Relative % 77 %   Neutro Abs 8.8 (H) 1.7 - 7.7 K/uL   Lymphocytes Relative 18 %   Lymphs Abs 2.0 0.7 - 4.0 K/uL   Monocytes Relative 5 %   Monocytes Absolute 0.6 0.1 - 1.0 K/uL   Eosinophils Relative 0 %   Eosinophils Absolute 0.1 0.0 - 0.5 K/uL   Basophils Relative 0 %   Basophils Absolute 0.0 0.0 - 0.1 K/uL   Immature Granulocytes 0 %   Abs Immature Granulocytes 0.04 0.00 - 0.07 K/uL    Comment: Performed at [facility] at [facility], [address] [place], [place] [address]      RADIOGRAPHY: No results found.     PATHOLOGY: None  ASSESSMENT/PLAN: Ms. Frayne is very pleasant 33 yo African American female with at least a 10 year history of leukocytosis. Today's WBC count is 11.5.  She is asymptomatic at this time and her counts remain stable.  With her family history of colon cancer at such a young age we will refer her to GI for evaluation and colonoscopy.  We will go ahead and plan to see her back in another 6 months for follow-up.   All questions were answered and she is in agreement with the plan. She will contact our office with any questions or concerns. We can certainly see her sooner if needed.   She was  discussed with and also seen by Dr. Marin Olp and he is in agreement with the aforementioned.   Laverna Peace, NP    Addendum: I saw and examined Ms. Woodcox with Judson Roch.  I agree with the above assessment.  Looked at her blood smear under the microscope.  I did not see anything that looked suspicious for a myeloproliferative problem.  I do not see any immature myeloid or lymphoid cells.  There were no nucleated red blood cells.  There was no rouleaux formation.  Platelets were adequate in size and granulation.  I think that the leukocytosis is reactive.  She has had this for quite a while.  1 thing I do worry about is that there is a history of colon cancer with her grandmother when she was 53 years old.  I think that this is a little troublesome and then even though Ms. Dingley is only 33 years old, I think she probably needs to be evaluated.  Hopefully, her insurance will allow her to have a colonoscopy.  Again, I do not see that she needs to have a bone marrow biopsy done.  I do not see that any invasive procedure needs to be done with her outside of the possibility of a colonoscopy.  I would be shocked if there was any malignancy found with a colonoscopy that might account for the leukocytosis..  We spent about 45 minutes with her.  We reviewed her lab work.  I talked her about the colonoscopy why I thought it was important for her to have  1 done.  She is in agreement.  I would like to see her back in about 4 months or so.  I just want make sure we follow-up with her.  Lattie Haw, MD

## 2019-05-17 ENCOUNTER — Other Ambulatory Visit: Payer: Self-pay

## 2019-05-17 ENCOUNTER — Encounter (INDEPENDENT_AMBULATORY_CARE_PROVIDER_SITE_OTHER): Payer: Self-pay | Admitting: Family Medicine

## 2019-05-17 ENCOUNTER — Ambulatory Visit (INDEPENDENT_AMBULATORY_CARE_PROVIDER_SITE_OTHER): Payer: 59 | Admitting: Family Medicine

## 2019-05-17 VITALS — BP 129/89 | HR 75 | Temp 98.6°F | Ht 65.0 in | Wt 300.0 lb

## 2019-05-17 DIAGNOSIS — E559 Vitamin D deficiency, unspecified: Secondary | ICD-10-CM | POA: Diagnosis not present

## 2019-05-17 DIAGNOSIS — R739 Hyperglycemia, unspecified: Secondary | ICD-10-CM

## 2019-05-17 DIAGNOSIS — R0602 Shortness of breath: Secondary | ICD-10-CM | POA: Diagnosis not present

## 2019-05-17 DIAGNOSIS — R5383 Other fatigue: Secondary | ICD-10-CM

## 2019-05-17 DIAGNOSIS — Z6841 Body Mass Index (BMI) 40.0 and over, adult: Secondary | ICD-10-CM

## 2019-05-17 DIAGNOSIS — Z1331 Encounter for screening for depression: Secondary | ICD-10-CM

## 2019-05-17 DIAGNOSIS — Z9189 Other specified personal risk factors, not elsewhere classified: Secondary | ICD-10-CM | POA: Diagnosis not present

## 2019-05-17 DIAGNOSIS — Z0289 Encounter for other administrative examinations: Secondary | ICD-10-CM

## 2019-05-18 ENCOUNTER — Encounter: Payer: Self-pay | Admitting: Gastroenterology

## 2019-05-18 LAB — COMPREHENSIVE METABOLIC PANEL
ALT: 9 IU/L (ref 0–32)
AST: 11 IU/L (ref 0–40)
Albumin/Globulin Ratio: 1.2 (ref 1.2–2.2)
Albumin: 3.9 g/dL (ref 3.8–4.8)
Alkaline Phosphatase: 111 IU/L (ref 39–117)
BUN/Creatinine Ratio: 17 (ref 9–23)
BUN: 15 mg/dL (ref 6–20)
Bilirubin Total: 0.2 mg/dL (ref 0.0–1.2)
CO2: 25 mmol/L (ref 20–29)
Calcium: 9.1 mg/dL (ref 8.7–10.2)
Chloride: 103 mmol/L (ref 96–106)
Creatinine, Ser: 0.88 mg/dL (ref 0.57–1.00)
GFR calc Af Amer: 98 mL/min/{1.73_m2} (ref >59)
GFR calc non Af Amer: 85 mL/min/{1.73_m2} (ref >59)
Globulin, Total: 3.3 g/dL (ref 1.5–4.5)
Glucose: 91 mg/dL (ref 65–99)
Potassium: 4.2 mmol/L (ref 3.5–5.2)
Sodium: 142 mmol/L (ref 134–144)
Total Protein: 7.2 g/dL (ref 6.0–8.5)

## 2019-05-18 LAB — FOLATE: Folate: 5.9 ng/mL (ref >3.0)

## 2019-05-18 LAB — INSULIN, RANDOM: INSULIN: 9 u[IU]/mL (ref 2.6–24.9)

## 2019-05-18 LAB — T3: T3, Total: 105 ng/dL (ref 71–180)

## 2019-05-18 LAB — VITAMIN B12: Vitamin B-12: 767 pg/mL (ref 232–1245)

## 2019-05-18 LAB — TSH: TSH: 1.35 u[IU]/mL (ref 0.450–4.500)

## 2019-05-18 LAB — VITAMIN D 25 HYDROXY (VIT D DEFICIENCY, FRACTURES): Vit D, 25-Hydroxy: 19.4 ng/mL — ABNORMAL LOW (ref 30.0–100.0)

## 2019-05-18 LAB — T4, FREE: Free T4: 1.03 ng/dL (ref 0.82–1.77)

## 2019-05-18 NOTE — Progress Notes (Signed)
Office: [phone]  /  Fax: [phone]   Dear Dr. Ethelene Hal,   Thank you for referring Courtney Mooney to our clinic. The following note includes my evaluation and treatment recommendations.  HPI:   Chief Complaint: OBESITY    Courtney Mooney has been referred by Libby Maw, MD for consultation regarding her obesity and obesity related comorbidities.    Courtney Mooney (MR# [id number]) is a 33 y.o. female who presents on 05/17/2019 for obesity evaluation and treatment. Current BMI is Body mass index is 49.92 kg/m. Courtney Mooney has been struggling with her weight for many years and has been unsuccessful in either losing weight, maintaining weight loss, or reaching her healthy weight goal.        Courtney Mooney is considering having weight loss surgery.     Courtney Mooney attended our information session and states she is currently in the action stage of change and ready to dedicate time achieving and maintaining a healthier weight. Courtney Mooney is interested in becoming our patient and working on intensive lifestyle modifications including (but not limited to) diet, exercise and weight loss.    Courtney Mooney states her family eats meals together she thinks her family will eat healthier with  her her desired weight loss is 100-110 lbs she has been heavy most of  her life she started gaining weight in her teens her heaviest weight ever was 316 lbs she has significant food cravings issues  she skips meals frequently she is frequently drinking liquids with calories she frequently makes poor food choices she frequently eats larger portions than normal  she struggles with emotional eating    Fatigue Courtney Mooney feels her energy is lower than it should be. This has worsened with weight gain and has not worsened recently. Courtney Mooney admits to daytime somnolence and  admits to waking up still tired. Patient is at risk for obstructive sleep apnea. Patent has a history of symptoms of daytime fatigue and  morning headache. Patient generally gets 5 hours of sleep per night, and states they generally have generally restful sleep. Snoring is present. Apneic episodes are present. Epworth Sleepiness Score is 8.  Dyspnea on exertion Courtney Mooney notes increasing shortness of breath with exercising and seems to be worsening over time with weight gain. She notes getting out of breath sooner with activity than she used to. This has not gotten worse recently. Courtney Mooney denies orthopnea.  Hyperglycemia Courtney Mooney has a history of some elevated blood glucose readings and A1c without a diagnosis of diabetes. She admits to polyphagia.  At risk for diabetes Courtney Mooney is at higher than average risk for developing diabetes due to her obesity and hyperglycemia. She currently denies polyuria or polydipsia.  Vitamin D Deficiency Courtney Mooney has a diagnosis of vitamin D deficiency. She is currently taking Vit D, but has no recent labs. She notes fatigue and denies nausea, vomiting or muscle weakness.  Depression Screen Courtney Mooney's Food and Mood (modified PHQ-9) score was  Depression screen PHQ 2/9 05/17/2019  Decreased Interest 3  Down, Depressed, Hopeless 2  PHQ - 2 Score 5  Altered sleeping 2  Tired, decreased energy 2  Change in appetite 3  Feeling bad or failure about yourself  2  Trouble concentrating 1  Moving slowly or fidgety/restless 0  Suicidal thoughts 0  PHQ-9 Score 15  Difficult doing work/chores Not difficult at all    ASSESSMENT AND PLAN:  Other fatigue - Plan: EKG 12-Lead, Vitamin B12, Folate, T3, T4, free, TSH  Shortness of breath on exertion  Hyperglycemia - Plan: Comprehensive  metabolic panel, Insulin, random  Vitamin D deficiency - Plan: VITAMIN D 25 Hydroxy (Vit-D Deficiency, Fractures)  Depression screening  At risk for diabetes mellitus  Class 3 severe obesity with serious comorbidity and body mass index (BMI) of 45.0 to 49.9 in adult, unspecified obesity type (Courtney Mooney)  PLAN:   Fatigue Courtney Mooney was informed that her fatigue may be related to obesity, depression or many other causes. Labs will be ordered, and in the meanwhile Courtney Mooney has agreed to work on diet, exercise and weight loss to help with fatigue. Proper sleep hygiene was discussed including the need for 7-8 hours of quality sleep each night. A sleep study was not ordered based on symptoms and Epworth score.  Dyspnea on exertion Courtney Mooney's shortness of breath appears to be obesity related and exercise induced. She has agreed to work on weight loss and gradually increase exercise to treat her exercise induced shortness of breath. If Courtney Mooney follows our instructions and loses weight without improvement of her shortness of breath, we will plan to refer to pulmonology. We will monitor this condition regularly. Courtney Mooney agrees to this plan.  Hyperglycemia Fasting labs will be obtained today and results with be discussed with Courtney Mooney in 2 weeks at her follow up visit. In the meanwhile Courtney Mooney was started on a lower simple carbohydrate diet and will work on weight loss efforts.  Diabetes risk counseling Courtney Mooney was given extended (15 minutes) diabetes prevention counseling today. She is 33 y.o. female and has risk factors for diabetes including obesity and hyperglycemia. We discussed intensive lifestyle modifications today with an emphasis on weight loss as well as increasing exercise and decreasing simple carbohydrates in her diet.  Vitamin D Deficiency Courtney Mooney was informed that low vitamin D levels contributes to fatigue and are associated with obesity, breast, and colon cancer. She will follow up for routine testing of vitamin D, at least 2-3 times per year. She was informed of the risk of over-replacement of vitamin D and agrees to not increase her dose unless she discusses this with [place] first. We will check labs today. Courtney Mooney agrees to follow up with our clinic in 2 weeks.  Depression Screen Courtney Mooney had a  strongly positive depression screening. Depression is commonly associated with obesity and often results in emotional eating behaviors. We will monitor this closely and work on CBT to help improve the non-hunger eating patterns. Referral to Psychology may be required if no improvement is seen as she continues in our clinic.  Obesity Courtney Mooney is currently in the action stage of change and her goal is to continue with weight loss efforts. I recommend Courtney Mooney begin the structured treatment plan as follows:  She has agreed to follow the Category 3 plan Courtney Mooney has been instructed to eventually work up to a goal of 150 minutes of combined cardio and strengthening exercise per week for weight loss and overall health benefits. We discussed the following Behavioral Modification Strategies today: increasing lean protein intake, decreasing simple carbohydrates  and work on meal planning and easy cooking plans   She was informed of the importance of frequent follow up visits to maximize her success with intensive lifestyle modifications for her multiple health conditions. She was informed we would discuss her lab results at her next visit unless there is a critical issue that needs to be addressed sooner. Courtney Mooney agreed to keep her next visit at the agreed upon time to discuss these results.  ALLERGIES: No Known Allergies  MEDICATIONS: Current Outpatient Medications on File Prior to Visit  Medication Sig Dispense Refill  . norethindrone (MICRONOR) 0.35 MG tablet Take 1 tablet (0.35 mg total) by mouth daily. 84 tablet 3  . Vitamin D, Ergocalciferol, (DRISDOL) 1.25 MG (50000 UT) CAPS capsule Take 1 capsule (50,000 Units total) by mouth every 7 (seven) days. 5 capsule 2   Current Facility-Administered Medications on File Prior to Visit  Medication Dose Route Frequency Provider Last Rate Last Dose  . aspirin chewable tablet 81 mg  81 mg Oral Daily Lavonia Drafts, MD      . Rho D Immune Globulin  SOSY 1,500 Units  1,500 Units Intramuscular Once Lavonia Drafts, MD        PAST MEDICAL HISTORY: Past Medical History:  Diagnosis Date  . Anemia   . Anxiety   . HSV-2 (herpes simplex virus 2) infection   . Hypertension   . PCOS (polycystic ovarian syndrome)   . Prediabetes   . Vaginal Pap smear, abnormal   . Vitamin D deficiency     PAST SURGICAL HISTORY: Past Surgical History:  Procedure Laterality Date  . CESAREAN SECTION    . CESAREAN SECTION N/A 07/15/2017   Procedure: CESAREAN SECTION;  Surgeon: Jonnie Kind, MD;  Location: [facility];  Service: Obstetrics;  Laterality: N/A;  . CHOLECYSTECTOMY      SOCIAL HISTORY: Social History   Tobacco Use  . Smoking status: Never Smoker  . Smokeless tobacco: Never Used  Substance Use Topics  . Alcohol use: No  . Drug use: No    FAMILY HISTORY: Family History  Problem Relation Age of Onset  . Diabetes Father   . Heart disease Father   . Alcoholism Father   . Drug abuse Father   . Obesity Father   . Colon cancer Maternal Grandmother   . Diabetes Maternal Grandmother   . Deep vein thrombosis Maternal Grandmother   . Pulmonary embolism Maternal Grandmother   . Hypertension Mother   . Sleep apnea Mother   . Obesity Mother     ROS: Review of Systems  Constitutional: Positive for malaise/fatigue. Negative for weight loss.       + Trouble sleeping  HENT: Positive for sinus pain.        + Nasal stuffiness  Eyes:       + Wear glasses or contacts  Respiratory: Positive for shortness of breath (with exertion).   Cardiovascular: Negative for orthopnea.  Gastrointestinal: Negative for nausea and vomiting.  Genitourinary: Negative for frequency.  Musculoskeletal:       Negative muscle weakness  Neurological: Positive for headaches.  Endo/Heme/Allergies: Negative for polydipsia.       Positive polyphagia  Psychiatric/Behavioral:       + Stress    PHYSICAL EXAM: Blood pressure 129/89, pulse 75,  temperature 98.6 F (37 C), temperature source Oral, height 5\' 5"  (1.651 m), weight 300 lb (136.1 kg), last menstrual period 04/22/2019, SpO2 100 %, currently breastfeeding. Body mass index is 49.92 kg/m. Physical Exam Vitals signs reviewed.  Constitutional:      Appearance: Normal appearance. She is obese.  HENT:     Head: Normocephalic and atraumatic.     Nose: Nose normal.  Eyes:     General: No scleral icterus.    Extraocular Movements: Extraocular movements intact.  Neck:     Musculoskeletal: Normal range of motion and neck supple.     Comments: No thyromegaly present Cardiovascular:     Rate and Rhythm: Normal rate and regular rhythm.     Pulses: Normal pulses.  Heart sounds: Normal heart sounds.  Pulmonary:     Effort: Pulmonary effort is normal. No respiratory distress.     Breath sounds: Normal breath sounds.  Abdominal:     Palpations: Abdomen is soft.     Tenderness: There is no abdominal tenderness.     Comments: + Obesity  Musculoskeletal: Normal range of motion.     Right lower leg: No edema.     Left lower leg: No edema.  Skin:    General: Skin is warm and dry.  Neurological:     Mental Status: She is alert and oriented to person, place, and time.     Coordination: Coordination normal.  Psychiatric:        Mood and Affect: Mood normal.        Behavior: Behavior normal.     RECENT LABS AND TESTS: BMET    Component Value Date/Time   NA 142 05/17/2019 1105   K 4.2 05/17/2019 1105   CL 103 05/17/2019 1105   CO2 25 05/17/2019 1105   GLUCOSE 91 05/17/2019 1105   GLUCOSE 104 (H) 05/11/2019 1024   BUN 15 05/17/2019 1105   CREATININE 0.88 05/17/2019 1105   CREATININE 0.82 05/11/2019 1024   CALCIUM 9.1 05/17/2019 1105   GFRNONAA 85 05/17/2019 1105   GFRNONAA >60 05/11/2019 1024   GFRAA 98 05/17/2019 1105   GFRAA >60 05/11/2019 1024   Lab Results  Component Value Date   HGBA1C 6.3 05/02/2019   Lab Results  Component Value Date   INSULIN 9.0  05/17/2019   CBC    Component Value Date/Time   WBC 11.5 (H) 05/11/2019 1024   WBC 13.4 (H) 05/02/2019 1022   RBC 4.79 05/11/2019 1024   HGB 12.6 05/11/2019 1024   HGB 10.9 (L) 04/06/2017 0930   HCT 41.2 05/11/2019 1024   HCT 33.9 (L) 04/06/2017 0930   PLT 377 05/11/2019 1024   PLT 352 04/06/2017 0930   MCV 86.0 05/11/2019 1024   MCV 84 04/06/2017 0930   MCH 26.3 05/11/2019 1024   MCHC 30.6 05/11/2019 1024   RDW 14.0 05/11/2019 1024   RDW 14.0 04/06/2017 0930   LYMPHSABS 2.0 05/11/2019 1024   LYMPHSABS 2.3 12/01/2016 1046   MONOABS 0.6 05/11/2019 1024   EOSABS 0.1 05/11/2019 1024   EOSABS 0.0 12/01/2016 1046   BASOSABS 0.0 05/11/2019 1024   BASOSABS 0.0 12/01/2016 1046   Iron/TIBC/Ferritin/ %Sat No results found for: IRON, TIBC, FERRITIN, IRONPCTSAT Lipid Panel     Component Value Date/Time   CHOL 145 05/02/2019 1022   TRIG 42.0 05/02/2019 1022   HDL 34.00 (L) 05/02/2019 1022   CHOLHDL 4 05/02/2019 1022   VLDL 8.4 05/02/2019 1022   LDLCALC 102 (H) 05/02/2019 1022   Hepatic Function Panel     Component Value Date/Time   PROT 7.2 05/17/2019 1105   ALBUMIN 3.9 05/17/2019 1105   AST 11 05/17/2019 1105   AST 12 (L) 05/11/2019 1024   ALT 9 05/17/2019 1105   ALT 9 05/11/2019 1024   ALKPHOS 111 05/17/2019 1105   BILITOT 0.2 05/17/2019 1105   BILITOT 0.4 05/11/2019 1024      Component Value Date/Time   TSH 1.350 05/17/2019 1105   TSH 1.30 05/02/2019 1022    ECG  shows NSR with a rate of 77 BPM INDIRECT CALORIMETER done today shows a VO2 of 267 and a REE of 1864.  Her calculated basal metabolic rate is [id number] thus her basal metabolic rate is worse than  expected.       OBESITY BEHAVIORAL INTERVENTION VISIT  Today's visit was # 1   Starting weight: 300 lbs Starting date: 05/17/2019 Today's weight : 300 lbs Today's date: 05/17/2019 Total lbs lost to date: 0    ASK: We discussed the diagnosis of obesity with [place] today and Courtney Mooney agreed  to give [place] permission to discuss obesity behavioral modification therapy today.  ASSESS: Courtney Mooney has the diagnosis of obesity and her BMI today is 20.92 Courtney Mooney is in the action stage of change   ADVISE: Courtney Mooney was educated on the multiple health risks of obesity as well as the benefit of weight loss to improve her health. She was advised of the need for long term treatment and the importance of lifestyle modifications to improve her current health and to decrease her risk of future health problems.  AGREE: Multiple dietary modification options and treatment options were discussed and  Courtney Mooney agreed to follow the recommendations documented in the above note.  ARRANGE: Courtney Mooney was educated on the importance of frequent visits to treat obesity as outlined per CMS and USPSTF guidelines and agreed to schedule her next follow up appointment today.  I, Trixie Dredge, am acting as transcriptionist for Dennard Nip, MD  I have reviewed the above documentation for accuracy and completeness, and I agree with the above. -Dennard Nip, MD

## 2019-05-23 ENCOUNTER — Other Ambulatory Visit: Payer: 59

## 2019-05-23 ENCOUNTER — Ambulatory Visit: Payer: 59 | Admitting: Family

## 2019-05-24 ENCOUNTER — Ambulatory Visit: Payer: 59 | Admitting: Family

## 2019-05-24 ENCOUNTER — Other Ambulatory Visit: Payer: 59
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
## 2019-05-27 ENCOUNTER — Ambulatory Visit: Payer: 59 | Admitting: Family

## 2019-05-27 ENCOUNTER — Other Ambulatory Visit: Payer: 59

## 2019-05-30 ENCOUNTER — Ambulatory Visit (INDEPENDENT_AMBULATORY_CARE_PROVIDER_SITE_OTHER): Payer: 59 | Admitting: Psychology

## 2019-05-30 ENCOUNTER — Other Ambulatory Visit: Payer: Self-pay

## 2019-05-30 DIAGNOSIS — F3289 Other specified depressive episodes: Secondary | ICD-10-CM

## 2019-06-01 ENCOUNTER — Ambulatory Visit (INDEPENDENT_AMBULATORY_CARE_PROVIDER_SITE_OTHER): Payer: 59 | Admitting: Family Medicine

## 2019-06-01 ENCOUNTER — Other Ambulatory Visit: Payer: Self-pay

## 2019-06-01 VITALS — BP 136/89 | HR 79 | Temp 98.5°F | Ht 65.0 in | Wt 301.0 lb

## 2019-06-01 DIAGNOSIS — Z9189 Other specified personal risk factors, not elsewhere classified: Secondary | ICD-10-CM | POA: Diagnosis not present

## 2019-06-01 DIAGNOSIS — Z6841 Body Mass Index (BMI) 40.0 and over, adult: Secondary | ICD-10-CM

## 2019-06-01 DIAGNOSIS — R7303 Prediabetes: Secondary | ICD-10-CM | POA: Diagnosis not present

## 2019-06-01 DIAGNOSIS — F3289 Other specified depressive episodes: Secondary | ICD-10-CM

## 2019-06-01 DIAGNOSIS — E559 Vitamin D deficiency, unspecified: Secondary | ICD-10-CM | POA: Diagnosis not present

## 2019-06-01 MED ORDER — BUPROPION HCL ER (SR) 150 MG PO TB12: 150.0000 mg | ORAL_TABLET | Freq: Every day | ORAL | 0 refills | Status: DC

## 2019-06-02 NOTE — Progress Notes (Signed)
Office: [phone]  /  Fax: [phone]   HPI:   Chief Complaint: OBESITY Courtney Mooney is here to discuss her progress with her obesity treatment plan. She is on the Category 3 plan and is following her eating plan approximately 95 % of the time. She states she is walking for 1-2 hours 3 times per week. Courtney Mooney states she did well overall with her eating plan. She is retaining some fluid today, and her weight is up. Her hunger was controlled but she struggled with emotional eating.  Her weight is (!) 301 lb (136.5 kg) today and has gained 1 lb since her last visit. She has lost 0 lbs since starting treatment with [place].  Pre-Diabetes Courtney Mooney has a diagnosis of pre-diabetes based on her elevated Hgb A1c and was informed this puts her at greater risk of developing diabetes. Her last A1c and insulin are elevated, and she notes polyphagia has decreased on her diet prescription. She is not taking metformin currently and continues to work on diet and exercise to decrease risk of diabetes. She denies hypoglycemia.  At risk for diabetes Courtney Mooney is at higher than average risk for developing diabetes due to her obesity and pre-diabetes. She currently denies polyuria or polydipsia.  Vitamin D Deficiency Courtney Mooney has a diagnosis of vitamin D deficiency. She is now on prescription Vit D, and her level is still low. She notes fatigue and denies nausea, vomiting or muscle weakness.  Depression with Emotional Eating Behaviors Courtney Mooney notes cravings and increased emotional eating, worse before lunch and dinner. She has increased stress at work and this year in general due to COVID-19. Courtney Mooney struggles with emotional eating and using food for comfort to the extent that it is negatively impacting her health. She often snacks when she is not hungry. Courtney Mooney sometimes feels she is out of control and then feels guilty that she made poor food choices. She has been working on behavior modification techniques to  help reduce her emotional eating and has been somewhat successful. She shows no sign of suicidal or homicidal ideations.  Depression screen PHQ 2/9 05/17/2019  Decreased Interest 3  Down, Depressed, Hopeless 2  PHQ - 2 Score 5  Altered sleeping 2  Tired, decreased energy 2  Change in appetite 3  Feeling bad or failure about yourself  2  Trouble concentrating 1  Moving slowly or fidgety/restless 0  Suicidal thoughts 0  PHQ-9 Score 15  Difficult doing work/chores Not difficult at all    ASSESSMENT AND PLAN:  Prediabetes  Vitamin D deficiency  Other depression - with emotional eating - Plan: buPROPion (WELLBUTRIN SR) 150 MG 12 hr tablet  At risk for diabetes mellitus  Class 3 severe obesity due to excess calories with body mass index (BMI) of 50.0 to 59.9 in adult, unspecified whether serious comorbidity present [facility])  PLAN:  Pre-Diabetes Courtney Mooney will continue to work on weight loss, diet, exercise, and decreasing simple carbohydrates in her diet to help decrease the risk of diabetes. We dicussed metformin including benefits and risks. She was informed that eating too many simple carbohydrates or too many calories at one sitting increases the likelihood of GI side effects. Courtney Mooney declined metformin for now and a prescription was not written today. We will recheck labs in 3 months. Courtney Mooney agrees to follow up with [place] as directed to monitor her progress.  Diabetes risk counseling Courtney Mooney was given extended (30 minutes) diabetes prevention counseling today. She is 33 y.o. female and has risk factors for diabetes including  obesity and pre-diabetes. We discussed intensive lifestyle modifications today with an emphasis on weight loss as well as increasing exercise and decreasing simple carbohydrates in her diet.  Vitamin D Deficiency Courtney Mooney was informed that low vitamin D levels contributes to fatigue and are associated with obesity, breast, and colon cancer. Courtney Mooney agrees to  continue taking prescription Vit D 50,000 IU every week and will follow up for routine testing of vitamin D, at least 2-3 times per year. She was informed of the risk of over-replacement of vitamin D and agrees to not increase her dose unless she discusses this with [place] first. We will recheck labs in 3 months. Courtney Mooney agrees to follow up with our clinic in 2 weeks.  Depression with Emotional Eating Behaviors We discussed behavior modification techniques today to help Courtney Mooney deal with her emotional eating and depression. Courtney Mooney agrees to start Wellbutrin SR 150 mg PO q AM #30 with no refills. Courtney Mooney agrees to follow up with our clinic in 2 weeks.  Obesity Courtney Mooney is currently in the action stage of change. As such, her goal is to continue with weight loss efforts She has agreed to follow the Category 3 plan Courtney Mooney has been instructed to work up to a goal of 150 minutes of combined cardio and strengthening exercise per week for weight loss and overall health benefits. We discussed the following Behavioral Modification Strategies today: increasing lean protein intake, decreasing simple carbohydrates  and emotional eating strategies   Courtney Mooney has agreed to follow up with our clinic in 2 weeks. She was informed of the importance of frequent follow up visits to maximize her success with intensive lifestyle modifications for her multiple health conditions.  ALLERGIES: No Known Allergies  MEDICATIONS: Current Outpatient Medications on File Prior to Visit  Medication Sig Dispense Refill  . norethindrone (MICRONOR) 0.35 MG tablet Take 1 tablet (0.35 mg total) by mouth daily. 84 tablet 3  . Vitamin D, Ergocalciferol, (DRISDOL) 1.25 MG (50000 UT) CAPS capsule Take 1 capsule (50,000 Units total) by mouth every 7 (seven) days. 5 capsule 2   Current Facility-Administered Medications on File Prior to Visit  Medication Dose Route Frequency Provider Last Rate Last Dose  . aspirin chewable tablet 81  mg  81 mg Oral Daily Lavonia Drafts, MD      . Rho D Immune Globulin SOSY 1,500 Units  1,500 Units Intramuscular Once Lavonia Drafts, MD        PAST MEDICAL HISTORY: Past Medical History:  Diagnosis Date  . Anemia   . Anxiety   . HSV-2 (herpes simplex virus 2) infection   . Hypertension   . PCOS (polycystic ovarian syndrome)   . Prediabetes   . Vaginal Pap smear, abnormal   . Vitamin D deficiency     PAST SURGICAL HISTORY: Past Surgical History:  Procedure Laterality Date  . CESAREAN SECTION    . CESAREAN SECTION N/A 07/15/2017   Procedure: CESAREAN SECTION;  Surgeon: Jonnie Kind, MD;  Location: [place];  Service: Obstetrics;  Laterality: N/A;  . CHOLECYSTECTOMY      SOCIAL HISTORY: Social History   Tobacco Use  . Smoking status: Never Smoker  . Smokeless tobacco: Never Used  Substance Use Topics  . Alcohol use: No  . Drug use: No    FAMILY HISTORY: Family History  Problem Relation Age of Onset  . Diabetes Father   . Heart disease Father   . Alcoholism Father   . Drug abuse Father   . Obesity Father   .  Colon cancer Maternal Grandmother   . Diabetes Maternal Grandmother   . Deep vein thrombosis Maternal Grandmother   . Pulmonary embolism Maternal Grandmother   . Hypertension Mother   . Sleep apnea Mother   . Obesity Mother     ROS: Review of Systems  Constitutional: Positive for malaise/fatigue. Negative for weight loss.  Gastrointestinal: Negative for nausea and vomiting.  Genitourinary: Negative for frequency.  Musculoskeletal:       Negative muscle weakness  Endo/Heme/Allergies: Negative for polydipsia.       Positive polyphagia Negative hypoglycemia  Psychiatric/Behavioral: Positive for depression. Negative for suicidal ideas.    PHYSICAL EXAM: Blood pressure 136/89, pulse 79, temperature 98.5 F (36.9 C), temperature source Oral, height 5\' 5"  (1.651 m), weight (!) 301 lb (136.5 kg), SpO2 100 %, currently  breastfeeding. Body mass index is 50.09 kg/m. Physical Exam Vitals signs reviewed.  Constitutional:      Appearance: Normal appearance. She is obese.  Cardiovascular:     Rate and Rhythm: Normal rate.     Pulses: Normal pulses.  Pulmonary:     Effort: Pulmonary effort is normal.     Breath sounds: Normal breath sounds.  Musculoskeletal: Normal range of motion.  Skin:    General: Skin is warm and dry.  Neurological:     Mental Status: She is alert and oriented to person, place, and time.  Psychiatric:        Mood and Affect: Mood normal.        Behavior: Behavior normal.     RECENT LABS AND TESTS: BMET    Component Value Date/Time   NA 142 05/17/2019 1105   K 4.2 05/17/2019 1105   CL 103 05/17/2019 1105   CO2 25 05/17/2019 1105   GLUCOSE 91 05/17/2019 1105   GLUCOSE 104 (H) 05/11/2019 1024   BUN 15 05/17/2019 1105   CREATININE 0.88 05/17/2019 1105   CREATININE 0.82 05/11/2019 1024   CALCIUM 9.1 05/17/2019 1105   GFRNONAA 85 05/17/2019 1105   GFRNONAA >60 05/11/2019 1024   GFRAA 98 05/17/2019 1105   GFRAA >60 05/11/2019 1024   Lab Results  Component Value Date   HGBA1C 6.3 05/02/2019   HGBA1C 5.8 (H) 02/02/2017   Lab Results  Component Value Date   INSULIN 9.0 05/17/2019   CBC    Component Value Date/Time   WBC 11.5 (H) 05/11/2019 1024   WBC 13.4 (H) 05/02/2019 1022   RBC 4.79 05/11/2019 1024   HGB 12.6 05/11/2019 1024   HGB 10.9 (L) 04/06/2017 0930   HCT 41.2 05/11/2019 1024   HCT 33.9 (L) 04/06/2017 0930   PLT 377 05/11/2019 1024   PLT 352 04/06/2017 0930   MCV 86.0 05/11/2019 1024   MCV 84 04/06/2017 0930   MCH 26.3 05/11/2019 1024   MCHC 30.6 05/11/2019 1024   RDW 14.0 05/11/2019 1024   RDW 14.0 04/06/2017 0930   LYMPHSABS 2.0 05/11/2019 1024   LYMPHSABS 2.3 12/01/2016 1046   MONOABS 0.6 05/11/2019 1024   EOSABS 0.1 05/11/2019 1024   EOSABS 0.0 12/01/2016 1046   BASOSABS 0.0 05/11/2019 1024   BASOSABS 0.0 12/01/2016 1046    Iron/TIBC/Ferritin/ %Sat No results found for: IRON, TIBC, FERRITIN, IRONPCTSAT Lipid Panel     Component Value Date/Time   CHOL 145 05/02/2019 1022   TRIG 42.0 05/02/2019 1022   HDL 34.00 (L) 05/02/2019 1022   CHOLHDL 4 05/02/2019 1022   VLDL 8.4 05/02/2019 1022   LDLCALC 102 (H) 05/02/2019 1022  Hepatic Function Panel     Component Value Date/Time   PROT 7.2 05/17/2019 1105   ALBUMIN 3.9 05/17/2019 1105   AST 11 05/17/2019 1105   AST 12 (L) 05/11/2019 1024   ALT 9 05/17/2019 1105   ALT 9 05/11/2019 1024   ALKPHOS 111 05/17/2019 1105   BILITOT 0.2 05/17/2019 1105   BILITOT 0.4 05/11/2019 1024      Component Value Date/Time   TSH 1.350 05/17/2019 1105   TSH 1.30 05/02/2019 1022      OBESITY BEHAVIORAL INTERVENTION VISIT  Today's visit was # 2   Starting weight: 300 lbs Starting date: 05/17/2019 Today's weight : 301 lbs  Today's date: 06/01/2019 Total lbs lost to date: 0    ASK: We discussed the diagnosis of obesity with [place] today and Courtney Mooney agreed to give [place] permission to discuss obesity behavioral modification therapy today.  ASSESS: Courtney Mooney has the diagnosis of obesity and her BMI today is 20.09 Courtney Mooney is in the action stage of change   ADVISE: Courtney Mooney was educated on the multiple health risks of obesity as well as the benefit of weight loss to improve her health. She was advised of the need for long term treatment and the importance of lifestyle modifications to improve her current health and to decrease her risk of future health problems.  AGREE: Multiple dietary modification options and treatment options were discussed and  Courtney Mooney agreed to follow the recommendations documented in the above note.  ARRANGE: Courtney Mooney was educated on the importance of frequent visits to treat obesity as outlined per CMS and USPSTF guidelines and agreed to schedule her next follow up appointment today.  I, Trixie Dredge, am acting as  transcriptionist for Dennard Nip, MD  I have reviewed the above documentation for accuracy and completeness, and I agree with the above. -Dennard Nip, MD

## 2019-06-09 ENCOUNTER — Ambulatory Visit (INDEPENDENT_AMBULATORY_CARE_PROVIDER_SITE_OTHER): Payer: 59

## 2019-06-09 ENCOUNTER — Other Ambulatory Visit: Payer: Self-pay

## 2019-06-09 ENCOUNTER — Ambulatory Visit: Payer: 59 | Admitting: Gastroenterology

## 2019-06-09 ENCOUNTER — Encounter: Payer: Self-pay | Admitting: Gastroenterology

## 2019-06-09 ENCOUNTER — Ambulatory Visit: Payer: 59 | Admitting: Family Medicine

## 2019-06-09 VITALS — BP 127/86 | HR 79 | Ht 65.0 in | Wt 299.1 lb

## 2019-06-09 VITALS — BP 134/86 | HR 84 | Temp 98.6°F | Ht 65.0 in | Wt 299.2 lb

## 2019-06-09 DIAGNOSIS — Z800 Family history of malignant neoplasm of digestive organs: Secondary | ICD-10-CM | POA: Diagnosis not present

## 2019-06-09 DIAGNOSIS — Z113 Encounter for screening for infections with a predominantly sexual mode of transmission: Secondary | ICD-10-CM | POA: Diagnosis not present

## 2019-06-09 DIAGNOSIS — Z202 Contact with and (suspected) exposure to infections with a predominantly sexual mode of transmission: Secondary | ICD-10-CM

## 2019-06-09 MED ORDER — CLENPIQ 10-3.5-12 MG-GM -GM/160ML PO SOLN: 1.0000 | Freq: Once | ORAL | 0 refills | Status: AC

## 2019-06-09 NOTE — Progress Notes (Addendum)
Pt presents for STI screening. Pt states concern about possible STI exposure. Self swab was sent to the lab.  Mylie Mccurley l Errin Chewning, CMA

## 2019-06-09 NOTE — Progress Notes (Signed)
Chief Complaint: For colonoscopy  Referring Provider:  [place], Holli Humbles, NP      ASSESSMENT AND PLAN;   #1. FH colon cancer (GM at age 33, mom had polyps at 36s)  #2.  Minimal rectal bleeding (resolved) with occ constipation. Unexplained leukocytosis.   Plan: - Proceed with colonoscopy (2 day prep). Discussed risks & benefits. (Risks including rare perforation req laparotomy, bleeding after biopsies/polypectomy req blood transfusion, rare chance of missing neoplasms, risks of anesthesia/sedation). Benefits outweigh the risks. Patient agrees to proceed. All the questions were answered. Consent forms given for review. - Drink plenty of water.   HPI:    Courtney Mooney is a 33 y.o. female  occ constipation x few years.  No change in stool caliber.  No urgency.  Has a rare rectal bleeding mostly attributed to hemorrhoids.  It has resolved. Drinks plenty of water.  Mild lactose intolerance by history as well.  Denies having any nausea, vomiting, heartburn, regurgitation, odynophagia or dysphagia.  Has been advised by oncology to get colonoscopy performed due to strong family history of colon cancer/colonic polyps and unexplained leukocytosis.  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 Past Medical History:  Diagnosis Date  . Anemia   . Anxiety   . HSV-2 (herpes simplex virus 2) infection   . Hypertension   . PCOS (polycystic ovarian syndrome)   . Prediabetes   . Vaginal Pap smear, abnormal   . Vitamin D deficiency     Past Surgical History:  Procedure Laterality Date  . CESAREAN SECTION    . CESAREAN SECTION N/A 07/15/2017   Procedure: CESAREAN SECTION;  Surgeon: Jonnie Kind, MD;  Location: [place];  Service: Obstetrics;  Laterality: N/A;  . CHOLECYSTECTOMY      Family History  Problem Relation Age of Onset  . Diabetes Father   . Heart disease Father   . Alcoholism Father   . Drug abuse Father   . Obesity Father   . Colon cancer  Maternal Grandmother   . Diabetes Maternal Grandmother   . Deep vein thrombosis Maternal Grandmother   . Pulmonary embolism Maternal Grandmother   . Hypertension Mother   . Sleep apnea Mother   . Obesity Mother   . Esophageal cancer Neg Hx     Social History   Tobacco Use  . Smoking status: Never Smoker  . Smokeless tobacco: Never Used  Substance Use Topics  . Alcohol use: Yes    Comment: ocaasionally will have wine   . Drug use: No    Current Outpatient Medications  Medication Sig Dispense Refill  . buPROPion (WELLBUTRIN SR) 150 MG 12 hr tablet Take 1 tablet (150 mg total) by mouth daily. 30 tablet 0  . norethindrone (MICRONOR) 0.35 MG tablet Take 1 tablet (0.35 mg total) by mouth daily. 84 tablet 3  . Vitamin D, Ergocalciferol, (DRISDOL) 1.25 MG (50000 UT) CAPS capsule Take 1 capsule (50,000 Units total) by mouth every 7 (seven) days. 5 capsule 2   No current facility-administered medications for this visit.     No Known Allergies  Review of Systems:  Constitutional: Denies fever, chills, diaphoresis, appetite change and fatigue.  HEENT: Denies photophobia, eye pain, redness, hearing loss, ear pain, congestion, sore throat, rhinorrhea, sneezing, mouth sores, neck pain, neck stiffness and tinnitus.   Respiratory: Denies SOB, DOE, cough, chest tightness,  and wheezing.   Cardiovascular: Denies chest pain, palpitations and leg swelling.  Genitourinary: Denies dysuria, urgency, frequency, hematuria, flank pain and  difficulty urinating.  Musculoskeletal: Denies myalgias, back pain, joint swelling, arthralgias and gait problem.  Skin: No rash.  Neurological: Denies dizziness, seizures, syncope, weakness, light-headedness, numbness and headaches.  Hematological: Denies adenopathy. Easy bruising, personal or family bleeding history  Psychiatric/Behavioral: No anxiety or depression     Physical Exam:    BP 134/86   Pulse 84   Temp 98.6 F (37 C)   Ht 5\' 5"  (1.651 m)   Wt  299 lb 4 oz (135.7 kg)   BMI 49.80 kg/m  Filed Weights   06/09/19 0840  Weight: 299 lb 4 oz (135.7 kg)   Constitutional:  Well-developed, in no acute distress. Psychiatric: Normal mood and affect. Behavior is normal. HEENT: Pupils normal.  Conjunctivae are normal. No scleral icterus. Neck supple.  Cardiovascular: Normal rate, regular rhythm. No edema Pulmonary/chest: Effort normal and breath sounds normal. No wheezing, rales or rhonchi. Abdominal: Soft, nondistended. Nontender. Bowel sounds active throughout. There are no masses palpable. No hepatomegaly. Rectal:  defered Neurological: Alert and oriented to person place and time. Skin: Skin is warm and dry. No rashes noted.  Data Reviewed: I have personally reviewed following labs and imaging studies  CBC: CBC Latest Ref Rng & Units 05/11/2019 05/02/2019 07/16/2017  WBC 4.0 - 10.5 K/uL 11.5(H) 13.4(H) 15.6(H)  Hemoglobin 12.0 - 15.0 g/dL 12.6 12.9 10.3(L)  Hematocrit 36.0 - 46.0 % 41.2 40.6 30.5(L)  Platelets 150 - 400 K/uL 377 411.0(H) 262    CMP: CMP Latest Ref Rng & Units 05/17/2019 05/11/2019 05/02/2019  Glucose 65 - 99 mg/dL 91 104(H) 98  BUN 6 - 20 mg/dL 15 17 16   Creatinine 0.57 - 1.00 mg/dL 0.88 0.82 0.78  Sodium 134 - 144 mmol/L 142 140 139  Potassium 3.5 - 5.2 mmol/L 4.2 4.2 3.9  Chloride 96 - 106 mmol/L 103 105 103  CO2 20 - 29 mmol/L 25 29 29   Calcium 8.7 - 10.2 mg/dL 9.1 8.9 8.9  Total Protein 6.0 - 8.5 g/dL 7.2 7.0 7.3  Total Bilirubin 0.0 - 1.2 mg/dL 0.2 0.4 0.3  Alkaline Phos 39 - 117 IU/L 111 78 103  AST 0 - 40 IU/L 11 12(L) 13  ALT 0 - 32 IU/L 9 9 10       Carmell Austria, MD 06/09/2019, 8:46 AM  Cc: Eliezer Bottom, NP

## 2019-06-09 NOTE — Progress Notes (Signed)
Chart reviewed - agree with CMA documentation.   

## 2019-06-09 NOTE — Patient Instructions (Signed)
If you are age 35 or older, your body mass index should be between 23-30. Your Body mass index is 49.8 kg/m. If this is out of the aforementioned range listed, please consider follow up with your Primary Care Provider.  If you are age 41 or younger, your body mass index should be between 19-25. Your Body mass index is 49.8 kg/m. If this is out of the aformentioned range listed, please consider follow up with your Primary Care Provider.   You have been scheduled for a colonoscopy. Please follow written instructions given to you at your visit today.  Please pick up your prep supplies at the pharmacy within the next 1-3 days. If you use inhalers (even only as needed), please bring them with you on the day of your procedure. Your physician has requested that you go to www.startemmi.com and enter the access code given to you at your visit today. This web site gives a general overview about your procedure. However, you should still follow specific instructions given to you by our office regarding your preparation for the procedure.  We have sent the following medications to your pharmacy for you to pick up at your convenience: Clenpiq  Two days before your procedure: Mix 3 packs (or capfuls) of Miralax in 48 ounces of clear liquid and drink at 6pm.  Thank you,  Dr. Jackquline Denmark

## 2019-06-10 LAB — CERVICOVAGINAL ANCILLARY ONLY
Chlamydia: NEGATIVE
Comment: NEGATIVE
Comment: NEGATIVE
Comment: NORMAL
Neisseria Gonorrhea: NEGATIVE
Trichomonas: NEGATIVE

## 2019-06-16 ENCOUNTER — Other Ambulatory Visit: Payer: Self-pay

## 2019-06-16 ENCOUNTER — Ambulatory Visit (INDEPENDENT_AMBULATORY_CARE_PROVIDER_SITE_OTHER): Payer: 59 | Admitting: Family Medicine

## 2019-06-16 ENCOUNTER — Encounter (INDEPENDENT_AMBULATORY_CARE_PROVIDER_SITE_OTHER): Payer: Self-pay | Admitting: Family Medicine

## 2019-06-16 VITALS — BP 145/87 | HR 78 | Temp 98.9°F | Ht 65.0 in | Wt 298.0 lb

## 2019-06-16 DIAGNOSIS — Z6841 Body Mass Index (BMI) 40.0 and over, adult: Secondary | ICD-10-CM

## 2019-06-16 DIAGNOSIS — I10 Essential (primary) hypertension: Secondary | ICD-10-CM | POA: Diagnosis not present

## 2019-06-16 DIAGNOSIS — F3289 Other specified depressive episodes: Secondary | ICD-10-CM | POA: Diagnosis not present

## 2019-06-21 ENCOUNTER — Encounter: Payer: Self-pay | Admitting: Gastroenterology

## 2019-06-21 NOTE — Progress Notes (Signed)
Office: [phone]  /  Fax: [phone]   HPI:   Chief Complaint: OBESITY Courtney Mooney is here to discuss her progress with her obesity treatment plan. She is on the Category 3 plan and is following her eating plan approximately 95 % of the time. She states she is exercising with a personal trainer and walking 30 to 45 minutes 2 times per week. Courtney Mooney stuck to the plan very well over the past few weeks. She does not like eggs so she struggles with breakfast. Courtney Mooney denies polyphagia. She does not find it hard to get in 8 to 10 ounces of meat at dinner. Her weight is 298 lb (135.2 kg) today and has had a weight loss of 3 pounds over a period of 2 weeks since her last visit. She has lost 2 lbs since starting treatment with [place].  Elevated Blood Pressure without hypertension Courtney Mooney has a diagnosis of elevated blood pressure without hypertension. Her blood pressure is 145/87 today. Her other blood pressures have been high normal. She denies chest pain or shortness of breath. She has a strong family history of hypertension. BP Readings from Last 3 Encounters:  06/16/19 (!) 145/87  06/09/19 127/86  06/09/19 134/86    Depression with emotional eating behaviors Courtney Mooney was started on Bupropion but she has stopped because of tingling in her arms and hands. Her cravings have been improved slightly.  She shows no sign of suicidal or homicidal ideations.  ASSESSMENT AND PLAN:  Elevated blood pressure reading with diagnosis of hypertension  Other depression  Class 3 severe obesity with serious comorbidity and body mass index (BMI) of 45.0 to 49.9 in adult, unspecified obesity type ([place])  PLAN:  Elevated Blood Pressure without hypertension We will keep an eye on this at her visits. We discussed that we may need to add medication. Courtney Mooney agrees to follow up with our clinic in 2 to 3 weeks.  Depression with Emotional Eating Behaviors We discussed emotional eating strategies. She has  agreed to discontinue Bupropion and follow up as directed.  I spent > than 50% of the 30 minute visit on counseling as documented in the note.   Obesity Courtney Mooney is currently in the action stage of change. As such, her goal is to continue with weight loss efforts She has agreed to follow the Category 3 plan with additional breakfast options Courtney Mooney will continue her current exercise regimen for weight loss and overall health benefits. We discussed the following Behavioral Modification Strategies today: planning for success, increasing lean protein intake and emotional eating strategies  Gave recipe for homemade protein smoothies for breakfast. We discussed protein exchanges for 2 ounces of meat.  Courtney Mooney has agreed to follow up with our clinic in 2 to 3 weeks. She was informed of the importance of frequent follow up visits to maximize her success with intensive lifestyle modifications for her multiple health conditions.  ALLERGIES: No Known Allergies  MEDICATIONS: Current Outpatient Medications on File Prior to Visit  Medication Sig Dispense Refill  . Cranberry-Milk Thistle (LIVER & KIDNEY CLEANSER PO) Take 1 tablet by mouth at bedtime.    . norethindrone (MICRONOR) 0.35 MG tablet Take 1 tablet (0.35 mg total) by mouth daily. 84 tablet 3  . Vitamin D, Ergocalciferol, (DRISDOL) 1.25 MG (50000 UT) CAPS capsule Take 1 capsule (50,000 Units total) by mouth every 7 (seven) days. 5 capsule 2   No current facility-administered medications on file prior to visit.     PAST MEDICAL HISTORY: Past Medical History:  Diagnosis Date  . Anemia   . Anxiety   . HSV-2 (herpes simplex virus 2) infection   . Hypertension   . PCOS (polycystic ovarian syndrome)   . Prediabetes   . Vaginal Pap smear, abnormal   . Vitamin D deficiency     PAST SURGICAL HISTORY: Past Surgical History:  Procedure Laterality Date  . CESAREAN SECTION    . CESAREAN SECTION N/A 07/15/2017   Procedure: CESAREAN  SECTION;  Surgeon: Jonnie Kind, MD;  Location: [facility];  Service: Obstetrics;  Laterality: N/A;  . CHOLECYSTECTOMY      SOCIAL HISTORY: Social History   Tobacco Use  . Smoking status: Never Smoker  . Smokeless tobacco: Never Used  Substance Use Topics  . Alcohol use: Yes    Comment: ocaasionally will have wine   . Drug use: No    FAMILY HISTORY: Family History  Problem Relation Age of Onset  . Diabetes Father   . Heart disease Father   . Alcoholism Father   . Drug abuse Father   . Obesity Father   . Colon cancer Maternal Grandmother   . Diabetes Maternal Grandmother   . Deep vein thrombosis Maternal Grandmother   . Pulmonary embolism Maternal Grandmother   . Hypertension Mother   . Sleep apnea Mother   . Obesity Mother   . Esophageal cancer Neg Hx     ROS: Review of Systems  Constitutional: Positive for weight loss.  Respiratory: Negative for shortness of breath.   Cardiovascular: Negative for chest pain.  Neurological: Positive for tingling (hands and feet).  Endo/Heme/Allergies:       Negative for polyphagia  Psychiatric/Behavioral: Positive for depression. Negative for suicidal ideas.    PHYSICAL EXAM: Blood pressure (!) 145/87, pulse 78, temperature 98.9 F (37.2 C), temperature source Oral, height 5\' 5"  (1.651 m), weight 298 lb (135.2 kg), SpO2 99 %, currently breastfeeding. Body mass index is 49.59 kg/m. Physical Exam Vitals signs reviewed.  Constitutional:      Appearance: Normal appearance. She is well-developed. She is obese.  Cardiovascular:     Rate and Rhythm: Normal rate.  Pulmonary:     Effort: Pulmonary effort is normal.  Musculoskeletal: Normal range of motion.  Skin:    General: Skin is warm and dry.  Neurological:     Mental Status: She is alert and oriented to person, place, and time.  Psychiatric:        Mood and Affect: Mood normal.        Behavior: Behavior normal.        Thought Content: Thought content does not  include homicidal or suicidal ideation.     RECENT LABS AND TESTS: BMET    Component Value Date/Time   NA 142 05/17/2019 1105   K 4.2 05/17/2019 1105   CL 103 05/17/2019 1105   CO2 25 05/17/2019 1105   GLUCOSE 91 05/17/2019 1105   GLUCOSE 104 (H) 05/11/2019 1024   BUN 15 05/17/2019 1105   CREATININE 0.88 05/17/2019 1105   CREATININE 0.82 05/11/2019 1024   CALCIUM 9.1 05/17/2019 1105   GFRNONAA 85 05/17/2019 1105   GFRNONAA >60 05/11/2019 1024   GFRAA 98 05/17/2019 1105   GFRAA >60 05/11/2019 1024   Lab Results  Component Value Date   HGBA1C 6.3 05/02/2019   HGBA1C 5.8 (H) 02/02/2017   Lab Results  Component Value Date   INSULIN 9.0 05/17/2019   CBC    Component Value Date/Time   WBC 11.5 (H) 05/11/2019  1024   WBC 13.4 (H) 05/02/2019 1022   RBC 4.79 05/11/2019 1024   HGB 12.6 05/11/2019 1024   HGB 10.9 (L) 04/06/2017 0930   HCT 41.2 05/11/2019 1024   HCT 33.9 (L) 04/06/2017 0930   PLT 377 05/11/2019 1024   PLT 352 04/06/2017 0930   MCV 86.0 05/11/2019 1024   MCV 84 04/06/2017 0930   MCH 26.3 05/11/2019 1024   MCHC 30.6 05/11/2019 1024   RDW 14.0 05/11/2019 1024   RDW 14.0 04/06/2017 0930   LYMPHSABS 2.0 05/11/2019 1024   LYMPHSABS 2.3 12/01/2016 1046   MONOABS 0.6 05/11/2019 1024   EOSABS 0.1 05/11/2019 1024   EOSABS 0.0 12/01/2016 1046   BASOSABS 0.0 05/11/2019 1024   BASOSABS 0.0 12/01/2016 1046   Iron/TIBC/Ferritin/ %Sat No results found for: IRON, TIBC, FERRITIN, IRONPCTSAT Lipid Panel     Component Value Date/Time   CHOL 145 05/02/2019 1022   TRIG 42.0 05/02/2019 1022   HDL 34.00 (L) 05/02/2019 1022   CHOLHDL 4 05/02/2019 1022   VLDL 8.4 05/02/2019 1022   LDLCALC 102 (H) 05/02/2019 1022   Hepatic Function Panel     Component Value Date/Time   PROT 7.2 05/17/2019 1105   ALBUMIN 3.9 05/17/2019 1105   AST 11 05/17/2019 1105   AST 12 (L) 05/11/2019 1024   ALT 9 05/17/2019 1105   ALT 9 05/11/2019 1024   ALKPHOS 111 05/17/2019 1105    BILITOT 0.2 05/17/2019 1105   BILITOT 0.4 05/11/2019 1024      Component Value Date/Time   TSH 1.350 05/17/2019 1105   TSH 1.30 05/02/2019 1022     Ref. Range 05/17/2019 11:05  Vitamin D, 25-Hydroxy Latest Ref Range: 30.0 - 100.0 ng/mL 19.4 (L)    OBESITY BEHAVIORAL INTERVENTION VISIT  Today's visit was # 3   Starting weight: 300 lbs Starting date: 05/17/2019 Today's weight : 298 lbs Today's date: 06/16/2019 Total lbs lost to date: 2    06/16/2019  Height 5\' 5"  (1.651 m)  Weight 298 lb (135.2 kg)  BMI (Calculated) 49.59  BLOOD PRESSURE - SYSTOLIC Q000111Q  BLOOD PRESSURE - DIASTOLIC 87   Body Fat % Q000111Q %  Total Body Water (lbs) 100.2 lbs    ASK: We discussed the diagnosis of obesity with [place] today and Courtney Mooney agreed to give [place] permission to discuss obesity behavioral modification therapy today.  ASSESS: Courtney Mooney has the diagnosis of obesity and her BMI today is 49.59 Courtney Mooney is in the action stage of change   ADVISE: Courtney Mooney was educated on the multiple health risks of obesity as well as the benefit of weight loss to improve her health. She was advised of the need for long term treatment and the importance of lifestyle modifications to improve her current health and to decrease her risk of future health problems.  AGREE: Multiple dietary modification options and treatment options were discussed and  Courtney Mooney agreed to follow the recommendations documented in the above note.  ARRANGE: Courtney Mooney was educated on the importance of frequent visits to treat obesity as outlined per CMS and USPSTF guidelines and agreed to schedule her next follow up appointment today.  I, Doreene Nest, am acting as transcriptionist for [employer], FNP-C  I have reviewed the above documentation for accuracy and completeness, and I agree with the above.  -  , FNP-C.

## 2019-06-27 ENCOUNTER — Encounter (INDEPENDENT_AMBULATORY_CARE_PROVIDER_SITE_OTHER): Payer: Self-pay | Admitting: Family Medicine

## 2019-06-27 DIAGNOSIS — I10 Essential (primary) hypertension: Secondary | ICD-10-CM | POA: Insufficient documentation

## 2019-06-28 ENCOUNTER — Encounter (INDEPENDENT_AMBULATORY_CARE_PROVIDER_SITE_OTHER): Payer: Self-pay

## 2019-06-29 IMAGING — US US MFM OB FOLLOW-UP
1 series · 14 of 28 positions shown · non-contrast
Comparison: none

[Series 1: us mfm ob follow-up · 14 of 33 slices shown]
[im 2/33]
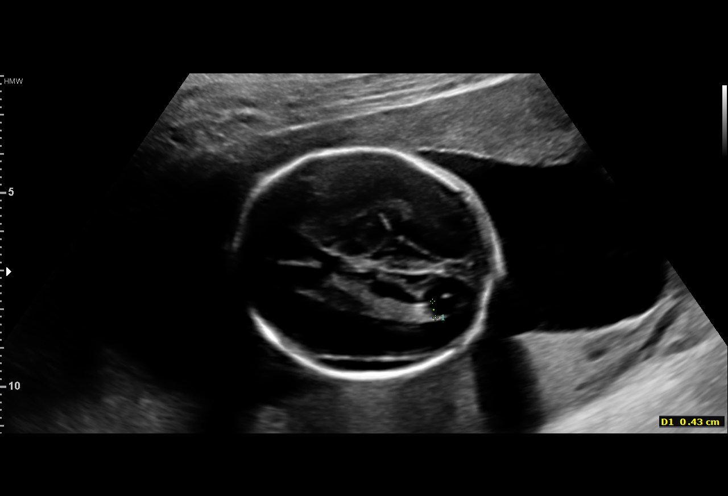
[im 4/33]
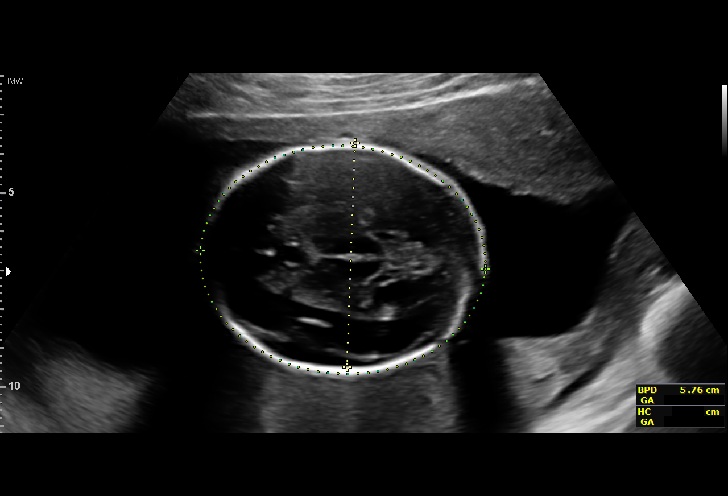
[im 6/33]
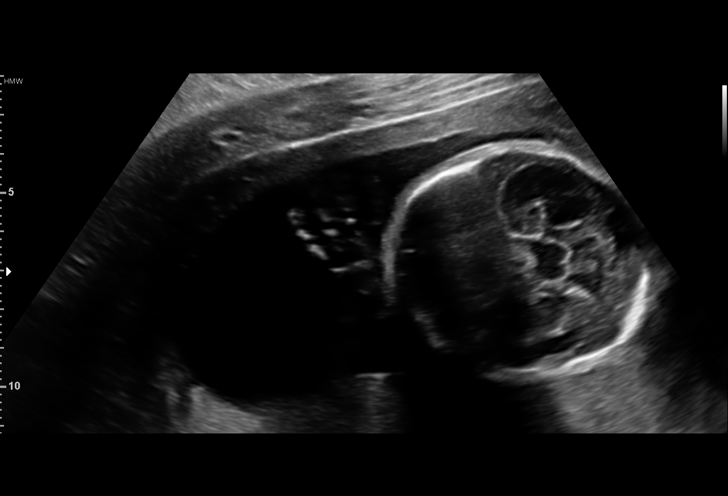
[im 9/33]
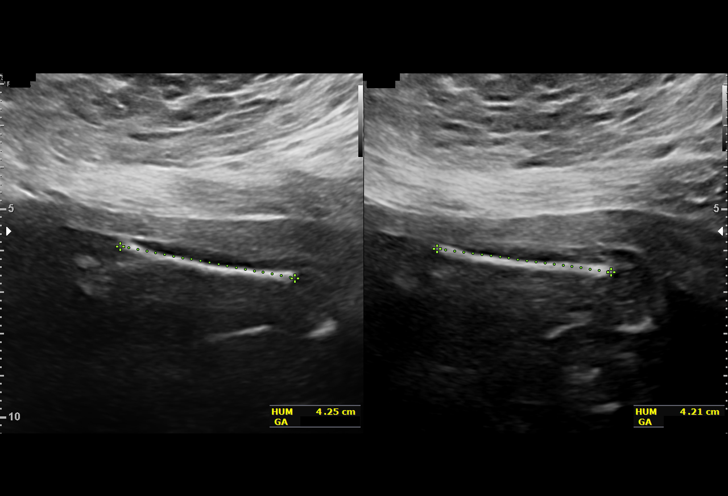
[im 11/33]
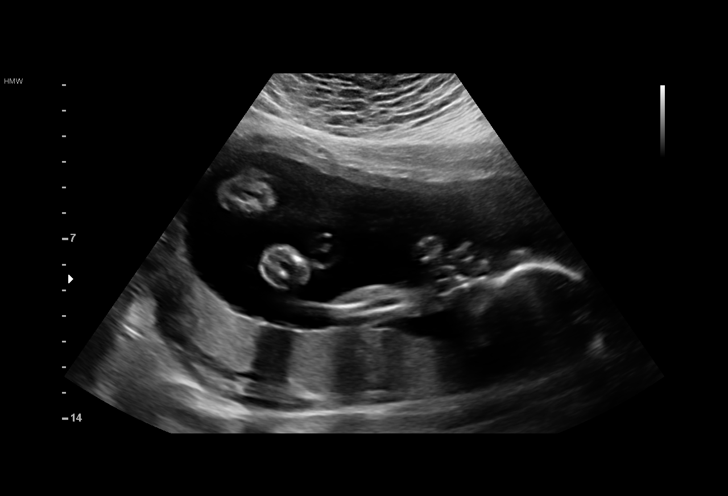
[im 14/33]
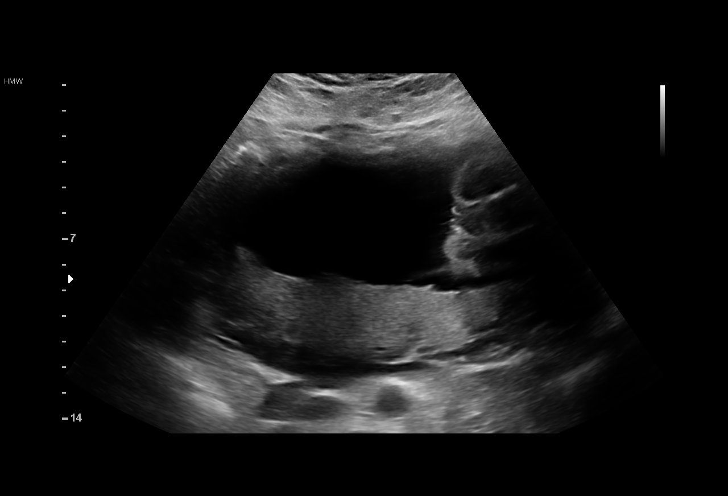
[im 16/33]
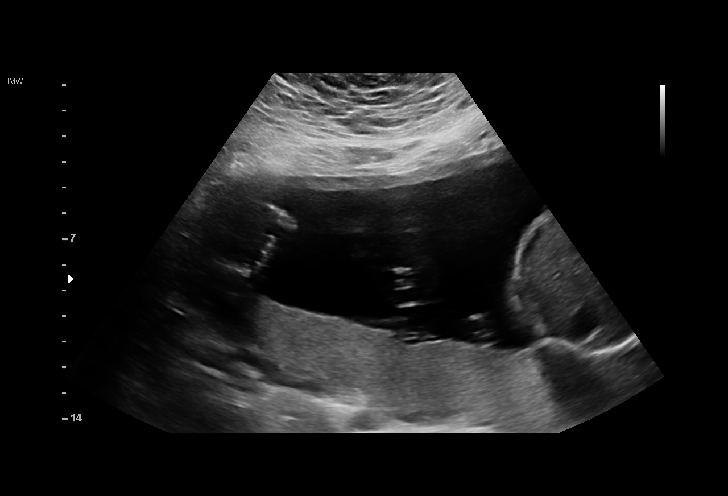
[im 18/33]
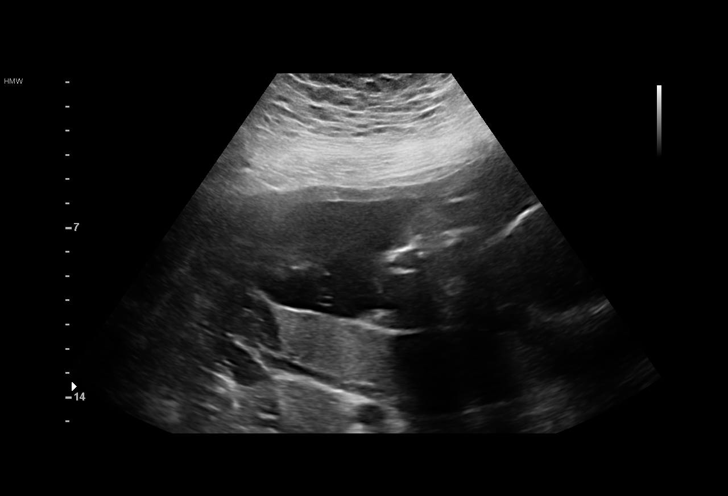
[im 21/33]
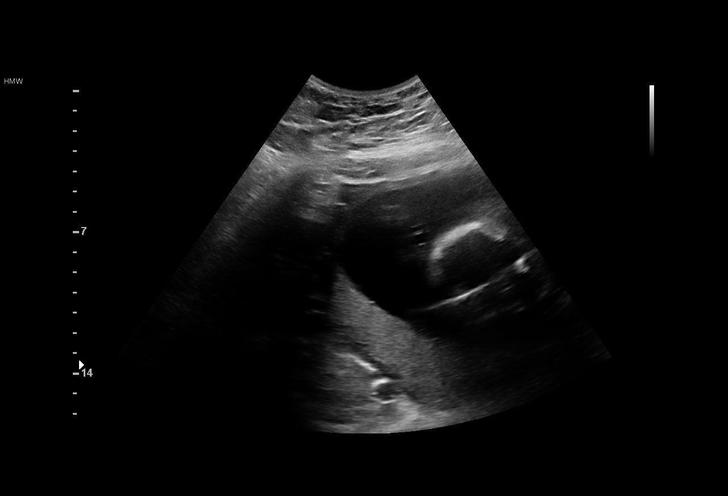
[im 23/33]
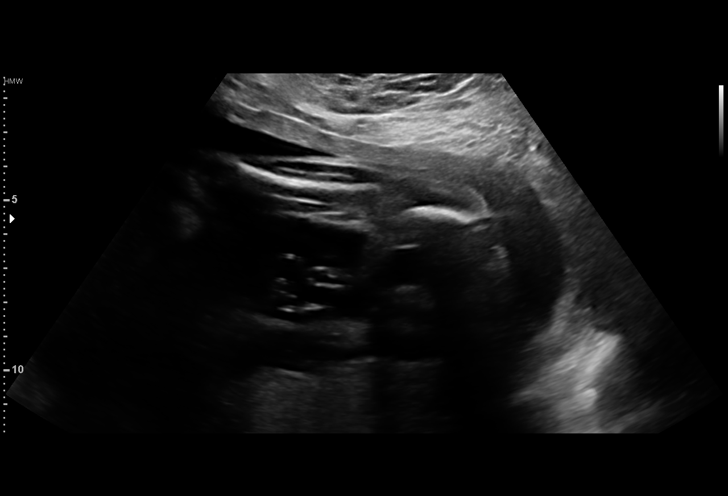
[im 25/33]
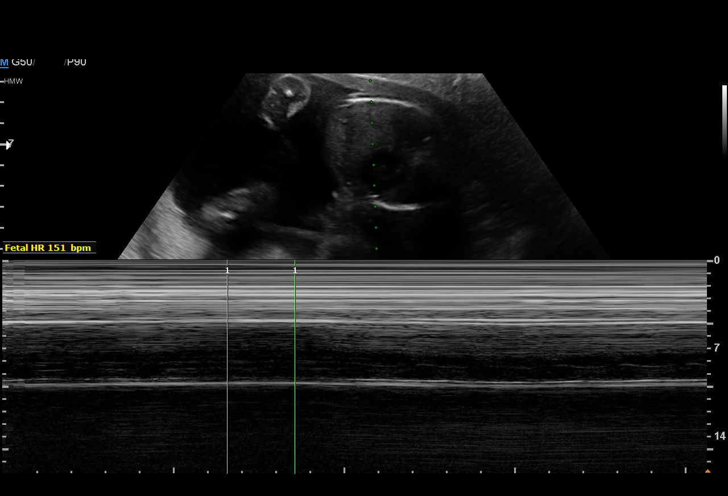
[im 28/33]
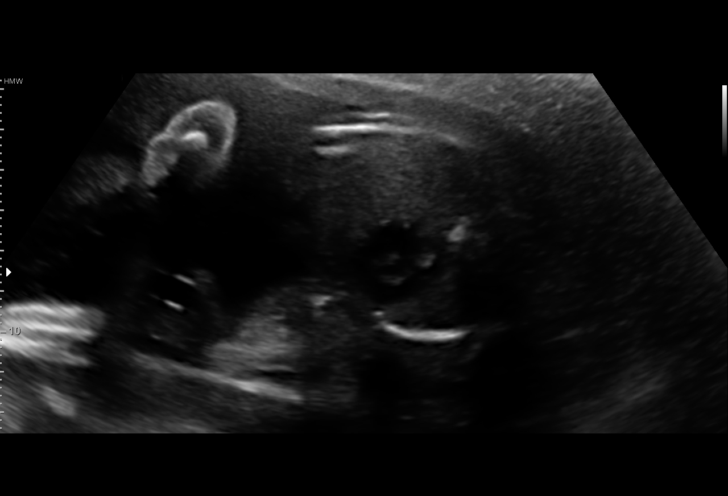
[im 30/33]
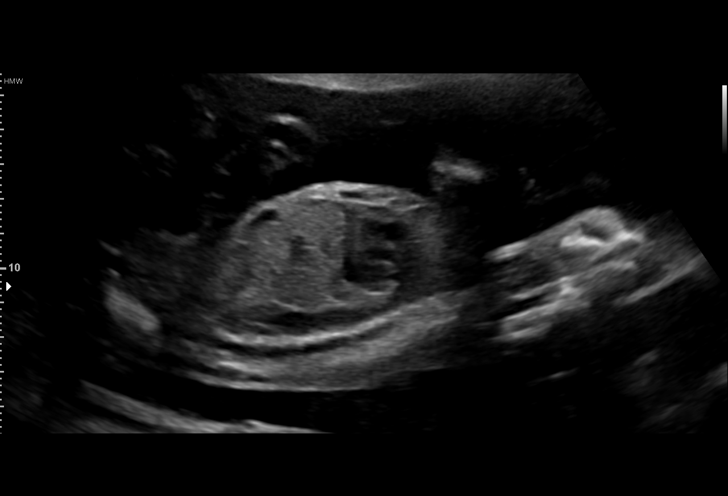
[im 33/33]
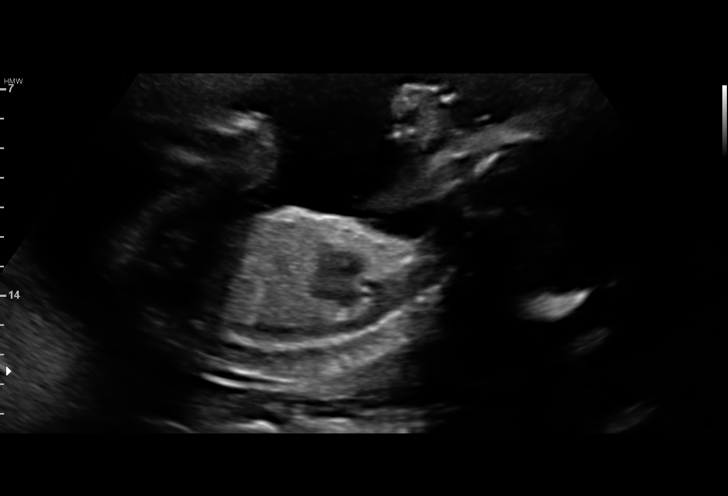

[14 of 28 positions shown; findings below may reference images not displayed]

for [REDACTED]care

1  SENTHIL RUFINO            [id number]      [id number]     [id number]
Indications

24 weeks gestation of pregnancy
Encounter for other antenatal screening
follow-up
Hypertension - Chronic/Pre-existing (ASA)
Previous cesarean delivery, antepartum
Obesity complicating pregnancy, second
trimester (pregravid BMI 52)
OB History

Gravidity:    3         Term:   1        Prem:   0        SAB:   1
TOP:          0       Ectopic:  0        Living: 1
Fetal Evaluation

Num Of Fetuses:     1
Fetal Heart         151
Rate(bpm):
Cardiac Activity:   Observed
Presentation:       Cephalic
Placenta:           Posterior, above cervical os
P. Cord Insertion:  Previously Visualized

Amniotic Fluid
AFI FV:      Subjectively within normal limits

Largest Pocket(cm)
6.6
Biometry

BPD:      57.5  mm     G. Age:  23w 4d         24  %    CI:        76.23   %   70 - 86
FL/HC:      21.2   %   18.7 -
HC:      208.7  mm     G. Age:  23w 0d          5  %    HC/AC:      1.11       1.05 -
AC:      188.2  mm     G. Age:  23w 4d         24  %    FL/BPD:     76.9   %   71 - 87
FL:       44.2  mm     G. Age:  24w 4d         50  %    FL/AC:      23.5   %   20 - 24
HUM:      42.3  mm     G. Age:  25w 3d         73  %

Est. FW:     638  gm      1 lb 7 oz     46  %
Gestational Age

LMP:           24w 1d       Date:   10/07/16                 EDD:   07/14/17
U/S Today:     23w 5d                                        EDD:   07/17/17
Best:          24w 1d    Det. By:   LMP  (10/07/16)          EDD:   07/14/17
Anatomy

Cranium:               Appears normal         Aortic Arch:            Appears normal
Cavum:                 Previously seen        Ductal Arch:            Appears normal
Ventricles:            Appears normal         Diaphragm:              Previously seen
Choroid Plexus:        Previously seen        Stomach:                Appears normal, left
sided
Cerebellum:            Previously seen        Abdomen:                Previously seen
Posterior Fossa:       Previously seen        Abdominal Wall:         Previously seen
Nuchal Fold:           Previously seen        Cord Vessels:           Previously seen
Face:                  Orbits and profile     Kidneys:                Appear normal
previously seen
Lips:                  Previously seen        Bladder:                Appears normal
Thoracic:              Appears normal         Spine:                  Previously seen
Heart:                 Previously seen        Upper Extremities:      Previously seen
RVOT:                  Appears normal         Lower Extremities:      Previously seen
LVOT:                  Previously seen

Other:  Fetus appears to be a female. Nasal bone visualized previously.
Technically difficult due to maternal habitus.
Cervix Uterus Adnexa

Cervix
Length:            4.3  cm.
Normal appearance by transabdominal scan.

Uterus
No abnormality visualized.

Left Ovary
No adnexal mass visualized.

Right Ovary
No adnexal mass visualized.

Cul De Sac:   No free fluid seen.

Adnexa:       No abnormality visualized.
Impression

Single IUP at 24+1 weeks with chronic hypertension. Here to
complete anatomic survey
Interval review of the fetal anatomy appears normal; all
relevant fetal anatomy has been visualized
Posterior placenta without previa
Normal amniotic fluid volume
Growth is normal in the 46th percentile
Recommendations

We recommend monthly scans for growth and antepartum
fetal testing from 30-32 weeks on all patients with chronic
hypertension

## 2019-06-30 ENCOUNTER — Ambulatory Visit (INDEPENDENT_AMBULATORY_CARE_PROVIDER_SITE_OTHER): Payer: 59 | Admitting: Family Medicine

## 2019-06-30 ENCOUNTER — Encounter (INDEPENDENT_AMBULATORY_CARE_PROVIDER_SITE_OTHER): Payer: Self-pay | Admitting: Family Medicine

## 2019-06-30 ENCOUNTER — Other Ambulatory Visit: Payer: Self-pay

## 2019-06-30 VITALS — BP 139/92 | HR 97 | Temp 98.8°F | Ht 65.0 in | Wt 296.0 lb

## 2019-06-30 DIAGNOSIS — I10 Essential (primary) hypertension: Secondary | ICD-10-CM

## 2019-06-30 DIAGNOSIS — Z6841 Body Mass Index (BMI) 40.0 and over, adult: Secondary | ICD-10-CM | POA: Diagnosis not present

## 2019-07-01 ENCOUNTER — Other Ambulatory Visit: Payer: Self-pay | Admitting: Gastroenterology

## 2019-07-01 ENCOUNTER — Ambulatory Visit (INDEPENDENT_AMBULATORY_CARE_PROVIDER_SITE_OTHER): Payer: 59

## 2019-07-01 DIAGNOSIS — Z1159 Encounter for screening for other viral diseases: Secondary | ICD-10-CM

## 2019-07-01 LAB — SARS CORONAVIRUS 2 (TAT 6-24 HRS): SARS Coronavirus 2: NEGATIVE

## 2019-07-03 NOTE — Progress Notes (Signed)
Office: [phone]  /  Fax: [phone]   HPI:   Chief Complaint: OBESITY Courtney Mooney is here to discuss Courtney Mooney progress with Courtney Mooney obesity treatment plan. Courtney Mooney is on the Category 3 plan with breakfast options and is following Courtney Mooney eating plan approximately 93-95 % of the time. Courtney Mooney states Courtney Mooney is walking for 30 minutes 2-3 times per week. Courtney Mooney continues to do well with weight loss on Courtney Mooney Category 3 plan. Courtney Mooney notes increased temptations at work, but is doing well avoiding this. Courtney Mooney hunger is controlled.  Courtney Mooney weight is 296 lb (134.3 kg) today and has had a weight loss of 2 pounds over a period of 2 weeks since Courtney Mooney last visit. Courtney Mooney has lost 4 lbs since starting treatment with Courtney Mooney.  Elevated Blood Pressure without History of Hypertension Courtney Mooney's last 2 blood pressure readings were elevated. Courtney Mooney is working on diet and exercise to help prevent hypertension. Courtney Mooney denies chest pain.  ASSESSMENT AND PLAN:  Elevated blood pressure reading with diagnosis of hypertension  Class 3 severe obesity with serious comorbidity and body mass index (BMI) of 45.0 to 49.9 in adult, unspecified obesity type (Courtney Mooney)  PLAN:  Elevated Blood Pressure without History of Hypertension Courtney Mooney will continue with diet and exercise, and we will recheck Courtney Mooney blood pressure in 2 to 3 weeks. We will start on medications at that time if no improvement.  I spent > than 50% of the 15 minute visit on counseling as documented in the note.  Obesity Courtney Mooney is currently in the action stage of change. As such, Courtney Mooney goal is to continue with weight loss efforts Courtney Mooney has agreed to follow the Category 3 plan with breakfast options Courtney Mooney has been instructed to work up to a goal of 150 minutes of combined cardio and strengthening exercise per week for weight loss and overall health benefits. We discussed the following Behavioral Modification Strategies today: dealing with family or coworker sabotage and avoiding temptations    Courtney Mooney has agreed to follow up with our clinic in 2 to 3 weeks. Courtney Mooney was informed of the importance of frequent follow up visits to maximize Courtney Mooney success with intensive lifestyle modifications for Courtney Mooney multiple health conditions.  ALLERGIES: No Known Allergies  MEDICATIONS: Current Outpatient Medications on File Prior to Visit  Medication Sig Dispense Refill  . norethindrone (MICRONOR) 0.35 MG tablet Take 1 tablet (0.35 mg total) by mouth daily. 84 tablet 3  . Vitamin D, Ergocalciferol, (DRISDOL) 1.25 MG (50000 UT) CAPS capsule Take 1 capsule (50,000 Units total) by mouth every 7 (seven) days. 5 capsule 2   No current facility-administered medications on file prior to visit.     PAST MEDICAL HISTORY: Past Medical History:  Diagnosis Date  . Anemia   . Anxiety   . HSV-2 (herpes simplex virus 2) infection   . Hypertension   . PCOS (polycystic ovarian syndrome)   . Prediabetes   . Vaginal Pap smear, abnormal   . Vitamin D deficiency     PAST SURGICAL HISTORY: Past Surgical History:  Procedure Laterality Date  . CESAREAN SECTION    . CESAREAN SECTION N/A 07/15/2017   Procedure: CESAREAN SECTION;  Surgeon: Jonnie Kind, MD;  Location: [facility];  Service: Obstetrics;  Laterality: N/A;  . CHOLECYSTECTOMY      SOCIAL HISTORY: Social History   Tobacco Use  . Smoking status: Never Smoker  . Smokeless tobacco: Never Used  Substance Use Topics  . Alcohol use: Yes    Comment: ocaasionally will have  wine   . Drug use: No    FAMILY HISTORY: Family History  Problem Relation Age of Onset  . Diabetes Father   . Heart disease Father   . Alcoholism Father   . Drug abuse Father   . Obesity Father   . Colon cancer Maternal Grandmother   . Diabetes Maternal Grandmother   . Deep vein thrombosis Maternal Grandmother   . Pulmonary embolism Maternal Grandmother   . Hypertension Mother   . Sleep apnea Mother   . Obesity Mother   . Esophageal cancer Neg Hx     ROS:  Review of Systems  Constitutional: Positive for weight loss.  Cardiovascular: Negative for chest pain.    PHYSICAL EXAM: Blood pressure (!) 139/92, pulse 97, temperature 98.8 F (37.1 C), temperature source Oral, height 5\' 5"  (1.651 m), weight 296 lb (134.3 kg), SpO2 100 %, currently breastfeeding. Body mass index is 49.26 kg/m. Physical Exam Vitals signs reviewed.  Constitutional:      Appearance: Normal appearance. Courtney Mooney is obese.  Cardiovascular:     Rate and Rhythm: Normal rate.     Pulses: Normal pulses.  Pulmonary:     Effort: Pulmonary effort is normal.     Breath sounds: Normal breath sounds.  Musculoskeletal: Normal range of motion.  Skin:    General: Skin is warm and dry.  Neurological:     Mental Status: Courtney Mooney is alert and oriented to person, place, and time.  Psychiatric:        Mood and Affect: Mood normal.        Behavior: Behavior normal.     RECENT LABS AND TESTS: BMET    Component Value Date/Time   NA 142 05/17/2019 1105   K 4.2 05/17/2019 1105   CL 103 05/17/2019 1105   CO2 25 05/17/2019 1105   GLUCOSE 91 05/17/2019 1105   GLUCOSE 104 (H) 05/11/2019 1024   BUN 15 05/17/2019 1105   CREATININE 0.88 05/17/2019 1105   CREATININE 0.82 05/11/2019 1024   CALCIUM 9.1 05/17/2019 1105   GFRNONAA 85 05/17/2019 1105   GFRNONAA >60 05/11/2019 1024   GFRAA 98 05/17/2019 1105   GFRAA >60 05/11/2019 1024   Lab Results  Component Value Date   HGBA1C 6.3 05/02/2019   HGBA1C 5.8 (H) 02/02/2017   Lab Results  Component Value Date   INSULIN 9.0 05/17/2019   CBC    Component Value Date/Time   WBC 11.5 (H) 05/11/2019 1024   WBC 13.4 (H) 05/02/2019 1022   RBC 4.79 05/11/2019 1024   HGB 12.6 05/11/2019 1024   HGB 10.9 (L) 04/06/2017 0930   HCT 41.2 05/11/2019 1024   HCT 33.9 (L) 04/06/2017 0930   PLT 377 05/11/2019 1024   PLT 352 04/06/2017 0930   MCV 86.0 05/11/2019 1024   MCV 84 04/06/2017 0930   MCH 26.3 05/11/2019 1024   MCHC 30.6 05/11/2019 1024    RDW 14.0 05/11/2019 1024   RDW 14.0 04/06/2017 0930   LYMPHSABS 2.0 05/11/2019 1024   LYMPHSABS 2.3 12/01/2016 1046   MONOABS 0.6 05/11/2019 1024   EOSABS 0.1 05/11/2019 1024   EOSABS 0.0 12/01/2016 1046   BASOSABS 0.0 05/11/2019 1024   BASOSABS 0.0 12/01/2016 1046   Iron/TIBC/Ferritin/ %Sat No results found for: IRON, TIBC, FERRITIN, IRONPCTSAT Lipid Panel     Component Value Date/Time   CHOL 145 05/02/2019 1022   TRIG 42.0 05/02/2019 1022   HDL 34.00 (L) 05/02/2019 1022   CHOLHDL 4 05/02/2019 1022   VLDL  8.4 05/02/2019 1022   LDLCALC 102 (H) 05/02/2019 1022   Hepatic Function Panel     Component Value Date/Time   PROT 7.2 05/17/2019 1105   ALBUMIN 3.9 05/17/2019 1105   AST 11 05/17/2019 1105   AST 12 (L) 05/11/2019 1024   ALT 9 05/17/2019 1105   ALT 9 05/11/2019 1024   ALKPHOS 111 05/17/2019 1105   BILITOT 0.2 05/17/2019 1105   BILITOT 0.4 05/11/2019 1024      Component Value Date/Time   TSH 1.350 05/17/2019 1105   TSH 1.30 05/02/2019 1022      OBESITY BEHAVIORAL INTERVENTION VISIT  Today's visit was # 4   Starting weight: 300 lbs Starting date: 05/17/2019 Today's weight : 296 lbs Today's date: 06/30/2019 Total lbs lost to date: 4    ASK: We discussed the diagnosis of obesity with [place] today and Courtney Mooney agreed to give Courtney Mooney permission to discuss obesity behavioral modification therapy today.  ASSESS: Courtney Mooney has the diagnosis of obesity and Courtney Mooney BMI today is 45.26 Courtney Mooney is in the action stage of change   ADVISE: Courtney Mooney was educated on the multiple health risks of obesity as well as the benefit of weight loss to improve Courtney Mooney health. Courtney Mooney was advised of the need for long term treatment and the importance of lifestyle modifications to improve Courtney Mooney current health and to decrease Courtney Mooney risk of future health problems.  AGREE: Multiple dietary modification options and treatment options were discussed and  Courtney Mooney agreed to follow the  recommendations documented in the above note.  ARRANGE: Courtney Mooney was educated on the importance of frequent visits to treat obesity as outlined per CMS and USPSTF guidelines and agreed to schedule Courtney Mooney next follow up appointment today.  I, Trixie Dredge, am acting as transcriptionist for Dennard Nip, MD  I have reviewed the above documentation for accuracy and completeness, and I agree with the above. -Dennard Nip, MD

## 2019-07-04 ENCOUNTER — Encounter: Payer: Self-pay | Admitting: Gastroenterology

## 2019-07-04 ENCOUNTER — Other Ambulatory Visit: Payer: Self-pay

## 2019-07-04 ENCOUNTER — Ambulatory Visit (AMBULATORY_SURGERY_CENTER): Payer: 59 | Admitting: Gastroenterology

## 2019-07-04 VITALS — BP 134/81 | HR 72 | Temp 98.6°F | Resp 18 | Ht 65.0 in | Wt 299.0 lb

## 2019-07-04 DIAGNOSIS — Z800 Family history of malignant neoplasm of digestive organs: Secondary | ICD-10-CM

## 2019-07-04 MED ORDER — SODIUM CHLORIDE 0.9 % IV SOLN: 500.0000 mL | Freq: Once | INTRAVENOUS | Status: DC

## 2019-07-04 NOTE — Progress Notes (Signed)
No problems noted in the recovery room. maw 

## 2019-07-04 NOTE — Progress Notes (Signed)
To PACU, VSS. Report to [email or web address] 

## 2019-07-04 NOTE — Progress Notes (Signed)
Temperature taken by J.B., VS taken by C.B. 

## 2019-07-04 NOTE — Patient Instructions (Signed)
YOU HAD AN ENDOSCOPIC PROCEDURE TODAY AT [facility]:   Refer to the procedure report that was given to you for any specific questions about what was found during the examination.  If the procedure report does not answer your questions, please call your gastroenterologist to clarify.  If you requested that your care partner not be given the details of your procedure findings, then the procedure report has been included in a sealed envelope for you to review at your convenience later.  YOU SHOULD EXPECT: Some feelings of bloating in the abdomen. Passage of more gas than usual.  Walking can help get rid of the air that was put into your GI tract during the procedure and reduce the bloating. If you had a lower endoscopy (such as a colonoscopy or flexible sigmoidoscopy) you may notice spotting of blood in your stool or on the toilet paper. If you underwent a bowel prep for your procedure, you may not have a normal bowel movement for a few days.  Please Note:  You might notice some irritation and congestion in your nose or some drainage.  This is from the oxygen used during your procedure.  There is no need for concern and it should clear up in a day or so.  SYMPTOMS TO REPORT IMMEDIATELY:   Following lower endoscopy (colonoscopy or flexible sigmoidoscopy):  Excessive amounts of blood in the stool  Significant tenderness or worsening of abdominal pains  Swelling of the abdomen that is new, acute  Fever of 100F or higher   For urgent or emergent issues, a gastroenterologist can be reached at any hour by calling [phone].   DIET:  We do recommend a small meal at first, but then you may proceed to your regular diet.  Drink plenty of fluids but you should avoid alcoholic beverages for 24 hours.  ACTIVITY:  You should plan to take it easy for the rest of today and you should NOT DRIVE or use heavy machinery until tomorrow (because of the sedation medicines used during the test).     FOLLOW UP: Our staff will call the number listed on your records 48-72 hours following your procedure to check on you and address any questions or concerns that you may have regarding the information given to you following your procedure. If we do not reach you, we will leave a message.  We will attempt to reach you two times.  During this call, we will ask if you have developed any symptoms of COVID 19. If you develop any symptoms (ie: fever, flu-like symptoms, shortness of breath, cough etc.) before then, please call [phone].  If you test positive for Covid 19 in the 2 weeks post procedure, please call and report this information to [place].    If any biopsies were taken you will be contacted by phone or by letter within the next 1-3 weeks.  Please call us at [phone] if you have not heard about the biopsies in 3 weeks.    SIGNATURES/CONFIDENTIALITY: You and/or your care partner have signed paperwork which will be entered into your electronic medical record.  These signatures attest to the fact that that the information above on your After Visit Summary has been reviewed and is understood.  Full responsibility of the confidentiality of this discharge information lies with you and/or your care-partner.    Handouts were given to your care partner on Hemorrhoids and a high fiber diet with liberal fluid intake.  Increase water intake. You may  resume your current medications today. Repeat colonoscopy in 5 years for screening purposes.  Earlier, if with any new problems or if there is any changes in family history. Return to GI clinic as needed. Please call if any questions or concerns.

## 2019-07-04 NOTE — Op Note (Signed)
[facility] Patient Name: Courtney Mooney Procedure Date: 07/04/2019 2:49 PM MRN: [id number] Endoscopist: Jackquline Denmark , MD Age: 33 Referring MD:  Date of Birth: 1986-04-25 Gender: Female Account #: [id number] Procedure:                Colonoscopy Indications:              #1. FH colon cancer (GM at age 60, mom had polyps                            at 45s)                           #2. Minimal rectal bleeding (resolved) with occ                            constipation. Unexplained leukocytosis. Medicines:                Monitored Anesthesia Care Procedure:                Pre-Anesthesia Assessment:                           - Prior to the procedure, a History and Physical                            was performed, and patient medications and                            allergies were reviewed. The patient's tolerance of                            previous anesthesia was also reviewed. The risks                            and benefits of the procedure and the sedation                            options and risks were discussed with the patient.                            All questions were answered, and informed consent                            was obtained. Prior Anticoagulants: The patient has                            taken no previous anticoagulant or antiplatelet                            agents. ASA Grade Assessment: II - A patient with                            mild systemic disease. After reviewing the risks  and benefits, the patient was deemed in                            satisfactory condition to undergo the procedure.                           After obtaining informed consent, the colonoscope                            was passed under direct vision. Throughout the                            procedure, the patient's blood pressure, pulse, and                            oxygen saturations were monitored continuously. The                 Colonoscope was introduced through the and advanced                            to the 2 cm into the ileum. The colonoscopy was                            performed without difficulty. The patient tolerated                            the procedure well. The quality of the bowel                            preparation was excellent. The terminal ileum,                            ileocecal valve, appendiceal orifice, and rectum                            were photographed. Scope In: 2:53:21 PM Scope Out: 3:02:40 PM Scope Withdrawal Time: 0 hours 6 minutes 12 seconds  Total Procedure Duration: 0 hours 9 minutes 19 seconds  Findings:                 The colon (entire examined portion) appeared normal.                           Non-bleeding internal hemorrhoids were found during                            retroflexion. The hemorrhoids were small.                           The terminal ileum appeared normal.                           The exam was otherwise without abnormality on  direct and retroflexion views. The colon was mildly                            redundant. Complications:            No immediate complications. Estimated Blood Loss:     Estimated blood loss: none. Impression:               -Minimal internal hemorrhoids.                           -Otherwise normal colonoscopy to TI. Recommendation:           - Patient has a contact number available for                            emergencies. The signs and symptoms of potential                            delayed complications were discussed with the                            patient. Return to normal activities tomorrow.                            Written discharge instructions were provided to the                            patient.                           - High fiber diet. Increase water intake.                           - Continue present medications.                           - Repeat  colonoscopy in 5 years for screening                            purposes. Earlier, if with any new problems or if                            there is any change in family history.                           - Return to GI office PRN. Jackquline Denmark, MD 07/04/2019 3:10:56 PM This report has been signed electronically.

## 2019-07-06 ENCOUNTER — Telehealth: Payer: Self-pay

## 2019-07-06 NOTE — Telephone Encounter (Signed)
Second post procedure follow up call, no answer 

## 2019-07-06 NOTE — Telephone Encounter (Signed)
No answer, left message to call back later today, B.Dravin Lance RN. 

## 2019-07-14 ENCOUNTER — Ambulatory Visit (INDEPENDENT_AMBULATORY_CARE_PROVIDER_SITE_OTHER): Payer: 59 | Admitting: Family Medicine

## 2019-07-14 ENCOUNTER — Encounter (INDEPENDENT_AMBULATORY_CARE_PROVIDER_SITE_OTHER): Payer: Self-pay | Admitting: Family Medicine

## 2019-07-14 ENCOUNTER — Other Ambulatory Visit: Payer: Self-pay

## 2019-07-14 VITALS — BP 125/83 | HR 86 | Temp 98.5°F | Ht 65.0 in | Wt 296.0 lb

## 2019-07-14 DIAGNOSIS — Z6841 Body Mass Index (BMI) 40.0 and over, adult: Secondary | ICD-10-CM | POA: Diagnosis not present

## 2019-07-14 DIAGNOSIS — E559 Vitamin D deficiency, unspecified: Secondary | ICD-10-CM

## 2019-07-14 DIAGNOSIS — Z9189 Other specified personal risk factors, not elsewhere classified: Secondary | ICD-10-CM | POA: Diagnosis not present

## 2019-07-14 MED ORDER — VITAMIN D (ERGOCALCIFEROL) 1.25 MG (50000 UNIT) PO CAPS: 50000.0000 [IU] | ORAL_CAPSULE | ORAL | 0 refills | Status: DC

## 2019-07-18 NOTE — Progress Notes (Signed)
Office: [phone]  /  Fax: [phone]   HPI:  Chief Complaint: OBESITY Courtney Mooney is here to discuss her progress with her obesity treatment plan. She is on the Category 3 plan and states she is following her eating plan approximately 80-85% of the time. She states she is exercising 0 minutes 0 times per week.  Courtney Mooney has done well maintaining her weight loss this month. She notes increased work temptations and has some celebrations coming up which might be challenging. She is considering weight loss surgery and has her consultation appointment coming up soon.  Today's visit was #5 Starting weight: 300 lbs Starting date: 05/17/2019 Today's weight: 296 lbs  Today's date: 07/14/2019 Total lbs lost to date: 4 Total lbs lost since last in-office visit: 0   Vitamin D deficiency Courtney Mooney has a diagnosis of Vitamin D deficiency. She is stable on prescription Vitamin D and states she will need a refill before her next appointment. No nausea, vomiting, or muscle weakness.  At risk for osteopenia and osteoporosis Courtney Mooney is at higher risk of osteopenia and osteoporosis due to Vitamin D deficiency.   ASSESSMENT AND PLAN:  Vitamin D deficiency - Plan: Vitamin D, Ergocalciferol, (DRISDOL) 1.25 MG (50000 UT) CAPS capsule  At risk for osteoporosis  Class 3 severe obesity with serious comorbidity and body mass index (BMI) of 45.0 to 49.9 in adult, unspecified obesity type ([place])  PLAN:  Vitamin D Deficiency Courtney Mooney was informed that low Vitamin D levels contributes to fatigue and are associated with obesity, breast, and colon cancer. She agrees to continue to take prescription Vit D @ 50,000 IU every week and will follow-up for routine testing of Vitamin D, at least 2-3 times per year. She was informed of the risk of over-replacement of Vitamin D and agrees to not increase her dose unless she discusses this with [place] first. Courtney Mooney agrees to follow-up with our clinic in 3-4 weeks.  At  risk for osteopenia and osteoporosis Courtney Mooney was given 15 minutes) s prevention counseling today. Courtney Mooney is at risk for osteopenia and osteoporosis due to her Vitamin D deficiency. She was encouraged to take her Vitamin D and follow her higher calcium diet and increase strengthening exercise to help strengthen her bones and decrease her risk of osteopenia and osteoporosis.  Obesity Courtney Mooney is currently in the action stage of change. As such, her goal is to continue with weight loss efforts. She has agreed to follow the Category 3 plan. Courtney Mooney was encouraged to go to her consultation appointment to get more information so she can make a well informed decision. Courtney Mooney has been instructed to work up to a goal of 150 minutes of combined cardio and strengthening exercise per week for weight loss and overall health benefits. We discussed the following Behavioral Modification Strategies today: dealing with family or coworker sabotage and holiday eating strategies.  Courtney Mooney has agreed to follow-up with our clinic in 3-4 weeks. She was informed of the importance of frequent follow-up visits to maximize her success with intensive lifestyle modifications for her multiple health conditions.  ALLERGIES: No Known Allergies  MEDICATIONS: Current Outpatient Medications on File Prior to Visit  Medication Sig Dispense Refill  . norethindrone (MICRONOR) 0.35 MG tablet Take 1 tablet (0.35 mg total) by mouth daily. 84 tablet 3   No current facility-administered medications on file prior to visit.    PAST MEDICAL HISTORY: Past Medical History:  Diagnosis Date  . Anemia   . Anxiety   . HSV-2 (herpes simplex virus  2) infection   . Hypertension   . PCOS (polycystic ovarian syndrome)   . Prediabetes   . Vaginal Pap smear, abnormal   . Vitamin D deficiency     PAST SURGICAL HISTORY: Past Surgical History:  Procedure Laterality Date  . CESAREAN SECTION    . CESAREAN SECTION N/A 07/15/2017    Procedure: CESAREAN SECTION;  Surgeon: Jonnie Kind, MD;  Location: [facility];  Service: Obstetrics;  Laterality: N/A;  . CHOLECYSTECTOMY      SOCIAL HISTORY: Social History   Tobacco Use  . Smoking status: Never Smoker  . Smokeless tobacco: Never Used  Substance Use Topics  . Alcohol use: Yes    Comment: ocaasionally will have wine   . Drug use: No    FAMILY HISTORY: Family History  Problem Relation Age of Onset  . Diabetes Father   . Heart disease Father   . Alcoholism Father   . Drug abuse Father   . Obesity Father   . Colon cancer Maternal Grandmother   . Diabetes Maternal Grandmother   . Deep vein thrombosis Maternal Grandmother   . Pulmonary embolism Maternal Grandmother   . Hypertension Mother   . Sleep apnea Mother   . Obesity Mother   . Esophageal cancer Neg Hx   . Rectal cancer Neg Hx   . Stomach cancer Neg Hx    ROS: Review of Systems  Constitutional: Negative for weight loss.  Gastrointestinal: Negative for nausea and vomiting.  Musculoskeletal:       Negative for muscle weakness.   PHYSICAL EXAM: Blood pressure 125/83, pulse 86, temperature 98.5 F (36.9 C), temperature source Oral, height 5\' 5"  (1.651 m), weight 296 lb (134.3 kg), last menstrual period 06/15/2019, SpO2 99 %, currently breastfeeding. Body mass index is 49.26 kg/m. Physical Exam Vitals reviewed.  Constitutional:      Appearance: Normal appearance. She is obese.  Cardiovascular:     Rate and Rhythm: Normal rate.     Pulses: Normal pulses.  Pulmonary:     Effort: Pulmonary effort is normal.     Breath sounds: Normal breath sounds.  Musculoskeletal:        General: Normal range of motion.  Skin:    General: Skin is warm and dry.  Neurological:     Mental Status: She is alert and oriented to person, place, and time.  Psychiatric:        Behavior: Behavior normal.   RECENT LABS AND TESTS: BMET    Component Value Date/Time   NA 142 05/17/2019 1105   K 4.2  05/17/2019 1105   CL 103 05/17/2019 1105   CO2 25 05/17/2019 1105   GLUCOSE 91 05/17/2019 1105   GLUCOSE 104 (H) 05/11/2019 1024   BUN 15 05/17/2019 1105   CREATININE 0.88 05/17/2019 1105   CREATININE 0.82 05/11/2019 1024   CALCIUM 9.1 05/17/2019 1105   GFRNONAA 85 05/17/2019 1105   GFRNONAA >60 05/11/2019 1024   GFRAA 98 05/17/2019 1105   GFRAA >60 05/11/2019 1024   Lab Results  Component Value Date   HGBA1C 6.3 05/02/2019   HGBA1C 5.8 (H) 02/02/2017   Lab Results  Component Value Date   INSULIN 9.0 05/17/2019   CBC    Component Value Date/Time   WBC 11.5 (H) 05/11/2019 1024   WBC 13.4 (H) 05/02/2019 1022   RBC 4.79 05/11/2019 1024   HGB 12.6 05/11/2019 1024   HGB 10.9 (L) 04/06/2017 0930   HCT 41.2 05/11/2019 1024   HCT  33.9 (L) 04/06/2017 0930   PLT 377 05/11/2019 1024   PLT 352 04/06/2017 0930   MCV 86.0 05/11/2019 1024   MCV 84 04/06/2017 0930   MCH 26.3 05/11/2019 1024   MCHC 30.6 05/11/2019 1024   RDW 14.0 05/11/2019 1024   RDW 14.0 04/06/2017 0930   LYMPHSABS 2.0 05/11/2019 1024   LYMPHSABS 2.3 12/01/2016 1046   MONOABS 0.6 05/11/2019 1024   EOSABS 0.1 05/11/2019 1024   EOSABS 0.0 12/01/2016 1046   BASOSABS 0.0 05/11/2019 1024   BASOSABS 0.0 12/01/2016 1046   Iron/TIBC/Ferritin/ %Sat No results found for: IRON, TIBC, FERRITIN, IRONPCTSAT Lipid Panel     Component Value Date/Time   CHOL 145 05/02/2019 1022   TRIG 42.0 05/02/2019 1022   HDL 34.00 (L) 05/02/2019 1022   CHOLHDL 4 05/02/2019 1022   VLDL 8.4 05/02/2019 1022   LDLCALC 102 (H) 05/02/2019 1022   Hepatic Function Panel     Component Value Date/Time   PROT 7.2 05/17/2019 1105   ALBUMIN 3.9 05/17/2019 1105   AST 11 05/17/2019 1105   AST 12 (L) 05/11/2019 1024   ALT 9 05/17/2019 1105   ALT 9 05/11/2019 1024   ALKPHOS 111 05/17/2019 1105   BILITOT 0.2 05/17/2019 1105   BILITOT 0.4 05/11/2019 1024      Component Value Date/Time   TSH 1.350 05/17/2019 1105   TSH 1.30 05/02/2019  1022      I, Michaelene Song, am acting as Location manager for Dennard Nip, MD I have reviewed the above documentation for accuracy and completeness, and I agree with the above. -Dennard Nip, MD

## 2019-07-20 ENCOUNTER — Ambulatory Visit: Payer: Self-pay

## 2019-07-20 NOTE — Telephone Encounter (Signed)
   Reason for Disposition . [1] [id number] diagnosed by positive lab test AND [2] mild symptoms (e.g., cough, fever, others) AND [id number] no complications or SOB  Answer Assessment - Initial Assessment Questions 1. COVID-19 DIAGNOSIS: "Who made your Coronavirus (COVID-19) diagnosis?" "Was it confirmed by a positive lab test?" If not diagnosed by a HCP, ask "Are there lots of cases (community spread) where you live?" (See public health department website, if unsure)     Tested positive at work 2. COVID-19 EXPOSURE: "Was there any known exposure to COVID before the symptoms began?" CDC Definition of close contact: within 6 feet (2 meters) for a total of 15 minutes or more over a 24-hour period.      Yes 3. ONSET: "When did the COVID-19 symptoms start?"      Monday 4. WORST SYMPTOM: "What is your worst symptom?" (e.g., cough, fever, shortness of breath, muscle aches)     Body aches, chills, sore throat, fatigue 5. COUGH: "Do you have a cough?" If so, ask: "How bad is the cough?"       Mild 6. FEVER: "Do you have a fever?" If so, ask: "What is your temperature, how was it measured, and when did it start?"     Low grade 7. RESPIRATORY STATUS: "Describe your breathing?" (e.g., shortness of breath, wheezing, unable to speak)      Yes 8. BETTER-SAME-WORSE: "Are you getting better, staying the same or getting worse compared to yesterday?"  If getting worse, ask, "In what way?"     Better 9. HIGH RISK DISEASE: "Do you have any chronic medical problems?" (e.g., asthma, heart or lung disease, weak immune system, obesity, etc.)     Yes 10. PREGNANCY: "Is there any chance you are pregnant?" "When was your last menstrual period?"       No 11. OTHER SYMPTOMS: "Do you have any other symptoms?"  (e.g., chills, fatigue, headache, loss of smell or taste, muscle pain, sore throat; new loss of smell or taste especially support the diagnosis of COVID-19)       Loss  Protocols used: CORONAVIRUS (COVID-19) DIAGNOSED OR  SUSPECTED-A-AH

## 2019-07-28 ENCOUNTER — Ambulatory Visit: Payer: 59 | Attending: Internal Medicine

## 2019-07-28 DIAGNOSIS — Z20822 Contact with and (suspected) exposure to covid-19: Secondary | ICD-10-CM

## 2019-07-30 LAB — NOVEL CORONAVIRUS, NAA: SARS-CoV-2, NAA: NOT DETECTED

## 2019-08-11 ENCOUNTER — Ambulatory Visit (INDEPENDENT_AMBULATORY_CARE_PROVIDER_SITE_OTHER): Payer: 59 | Admitting: Family Medicine

## 2019-08-25 ENCOUNTER — Encounter (INDEPENDENT_AMBULATORY_CARE_PROVIDER_SITE_OTHER): Payer: Self-pay | Admitting: Family Medicine

## 2019-08-25 ENCOUNTER — Ambulatory Visit (INDEPENDENT_AMBULATORY_CARE_PROVIDER_SITE_OTHER): Payer: 59 | Admitting: Family Medicine

## 2019-08-25 ENCOUNTER — Other Ambulatory Visit: Payer: Self-pay

## 2019-08-25 VITALS — BP 141/91 | HR 79 | Temp 98.2°F | Ht 65.0 in | Wt 290.0 lb

## 2019-08-25 DIAGNOSIS — I10 Essential (primary) hypertension: Secondary | ICD-10-CM

## 2019-08-25 DIAGNOSIS — Z9189 Other specified personal risk factors, not elsewhere classified: Secondary | ICD-10-CM | POA: Diagnosis not present

## 2019-08-25 DIAGNOSIS — F3289 Other specified depressive episodes: Secondary | ICD-10-CM | POA: Diagnosis not present

## 2019-08-25 DIAGNOSIS — Z6841 Body Mass Index (BMI) 40.0 and over, adult: Secondary | ICD-10-CM

## 2019-08-25 DIAGNOSIS — F32A Depression, unspecified: Secondary | ICD-10-CM | POA: Insufficient documentation

## 2019-08-25 DIAGNOSIS — F329 Major depressive disorder, single episode, unspecified: Secondary | ICD-10-CM | POA: Insufficient documentation

## 2019-08-25 MED ORDER — AMLODIPINE BESYLATE 5 MG PO TABS: 5.0000 mg | ORAL_TABLET | Freq: Every day | ORAL | 0 refills | Status: DC

## 2019-08-25 NOTE — Progress Notes (Signed)
Chief Complaint:   [place] is here to discuss her progress with her obesity treatment plan along with follow-up of her obesity related diagnoses. Courtney Mooney is on the Category 3 Plan and states she is following her eating plan approximately 80-85% of the time. Courtney Mooney states she is exercising 0 minutes 0 times per week.  Today's visit was #: 6 Starting weight: 300 lbs Starting date: 05/17/2019 Today's weight: 290 lbs Today's date: 08/25/2019 Total lbs lost to date: 10 Total lbs lost since last in-office visit: 6  Interim History: Courtney Mooney is very happy with her weight loss. She reports getting her protein in at breakfast but not at dinner. She is not eating her snack calories.  Subjective:   Essential hypertension. Blood pressures have been elevated over the last few visits. No chest pain or shortness of breath. Her father has congestive heart failure and she has a strong family history of hypertension.  BP Readings from Last 3 Encounters:  08/25/19 (!) 141/91  07/14/19 125/83  07/04/19 134/81   Lab Results  Component Value Date   CREATININE 0.88 05/17/2019   CREATININE 0.82 05/11/2019   CREATININE 0.78 05/02/2019   At risk for heart disease. Courtney Mooney is at a higher than average risk for cardiovascular disease due to obesity and HTN. Reviewed: no chest pain on exertion, no dyspnea on exertion, and no swelling of ankles.  Other depression, with emotional eating. Courtney Mooney is struggling with emotional eating and using food for comfort to the extent that it is negatively impacting her health.  Courtney Mooney reports eating more around her period. She could not tolerate bupropion; she felt she had chest pain. She reports stress eating in the evening.  Assessment/Plan:   Essential hypertension. Courtney Mooney is working on healthy weight loss and exercise to improve blood pressure control. We will watch for signs of hypotension as she continues her lifestyle modifications.  New prescription was given for amLODipine (NORVASC) 5 MG tablet QD #30 with 0 refills.  At risk for heart disease. Courtney Mooney was given approximately 15 minutes of coronary artery disease prevention counseling today. She is 34 y.o. female and has risk factors for heart disease including obesity. We discussed intensive lifestyle modifications today with an emphasis on specific weight loss instructions and strategies.   Repetitive spaced learning was employed today to elicit superior memory formation and behavioral change.  Other depression, with emotional eating. Behavior modification techniques were discussed today to help Courtney Mooney deal with her emotional/non-hunger eating behaviors.  Orders and follow up as documented in patient record. She will use snack calories in the evening and will employ strategies to avoid emotional eating.  Class 3 severe obesity with serious comorbidity and body mass index (BMI) of 45.0 to 49.9 in adult, unspecified obesity type ([place]), Chronic.  Courtney Mooney is currently in the action stage of change. As such, her goal is to continue with weight loss efforts. She has agreed to the Category 3 Plan. We discussed protein substitutions for 2 ounces of meat.  Exercise goals: Courtney Mooney will walk 30 minutes 2-3 days a week.  Behavioral modification strategies: increasing lean protein intake, emotional eating strategies and planning for success.  Courtney Mooney has agreed to follow-up with our clinic in 3 weeks. She was informed of the importance of frequent follow-up visits to maximize her success with intensive lifestyle modifications for her multiple health conditions.   Objective:   Blood pressure (!) 141/91, pulse 79, temperature 98.2 F (36.8 C), temperature source Oral, height 5\' 5"  (  1.651 m), weight 290 lb (131.5 kg), SpO2 99 %, currently breastfeeding. Body mass index is 48.26 kg/m.  General: Cooperative, alert, well developed, in no acute distress. HEENT: Conjunctivae and  lids unremarkable. Cardiovascular: Regular rhythm.  Lungs: Normal work of breathing. Neurologic: No focal deficits.   Lab Results  Component Value Date   CREATININE 0.88 05/17/2019   BUN 15 05/17/2019   NA 142 05/17/2019   K 4.2 05/17/2019   CL 103 05/17/2019   CO2 25 05/17/2019   Lab Results  Component Value Date   ALT 9 05/17/2019   AST 11 05/17/2019   ALKPHOS 111 05/17/2019   BILITOT 0.2 05/17/2019   Lab Results  Component Value Date   HGBA1C 6.3 05/02/2019   HGBA1C 5.8 (H) 02/02/2017   Lab Results  Component Value Date   INSULIN 9.0 05/17/2019   Lab Results  Component Value Date   TSH 1.350 05/17/2019   Lab Results  Component Value Date   CHOL 145 05/02/2019   HDL 34.00 (L) 05/02/2019   LDLCALC 102 (H) 05/02/2019   TRIG 42.0 05/02/2019   CHOLHDL 4 05/02/2019   Lab Results  Component Value Date   WBC 11.5 (H) 05/11/2019   HGB 12.6 05/11/2019   HCT 41.2 05/11/2019   MCV 86.0 05/11/2019   PLT 377 05/11/2019   No results found for: IRON, TIBC, FERRITIN  Attestation Statements:   Reviewed by clinician on day of visit: allergies, medications, problem list, medical history, surgical history, family history, social history, and previous encounter notes.  IMichaelene Song, am acting as Location manager for [employer], FNP   I have reviewed the above documentation for accuracy and completeness, and I agree with the above. -  Georgianne Fick, FNP

## 2019-08-26 ENCOUNTER — Encounter (HOSPITAL_COMMUNITY): Payer: Self-pay | Admitting: Emergency Medicine

## 2019-08-26 ENCOUNTER — Other Ambulatory Visit: Payer: Self-pay

## 2019-08-26 ENCOUNTER — Ambulatory Visit (HOSPITAL_COMMUNITY)
Admission: EM | Admit: 2019-08-26 | Discharge: 2019-08-26 | Disposition: A | Discharge status: Home / Self Care | Payer: 59

## 2019-08-26 DIAGNOSIS — R030 Elevated blood-pressure reading, without diagnosis of hypertension: Secondary | ICD-10-CM

## 2019-08-26 DIAGNOSIS — I10 Essential (primary) hypertension: Secondary | ICD-10-CM | POA: Diagnosis not present

## 2019-08-26 NOTE — ED Triage Notes (Addendum)
Pt states she came back from lunch at work at 1230 and she stated she was feeling very hot, her head hurt and she was having tingling in her left upper arm.  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  Pt called her PCP and she was instructed to go get checked out.  Pt was seen by her weight management MD yesterday and prescribed BP medication.  Pt has not picked up the medication yet.

## 2019-08-26 NOTE — ED Provider Notes (Signed)
[name]   MRN: [id number] DOB: [birth year]  Subjective:   Courtney Mooney is a 34 y.o. female presenting for check on her HTN. Had an episodes of feeling hot, having a headache, had left upper arm tingling, transient difficulty with her breathing. Works at [employer], wears an N95, states that this made it hard for her to breathe. She checked her bp 149/91, called her PCP and was advised to come in for an evaluation. She was just started on amlodipine, has not taken any yet.  Denies history of diabetes but is working on weight loss.  No current facility-administered medications for this encounter.  Current Outpatient Medications:  .  amLODipine (NORVASC) 5 MG tablet, Take 1 tablet (5 mg total) by mouth daily., Disp: 30 tablet, Rfl: 0 .  norethindrone (MICRONOR) 0.35 MG tablet, Take 1 tablet (0.35 mg total) by mouth daily., Disp: 84 tablet, Rfl: 3 .  Vitamin D, Ergocalciferol, (DRISDOL) 1.25 MG (50000 UT) CAPS capsule, Take 1 capsule (50,000 Units total) by mouth every 7 (seven) days., Disp: 4 capsule, Rfl: 0   No Known Allergies  Past Medical History:  Diagnosis Date  . Anemia   . Anxiety   . HSV-2 (herpes simplex virus 2) infection   . Hypertension   . PCOS (polycystic ovarian syndrome)   . Prediabetes   . Vaginal Pap smear, abnormal   . Vitamin D deficiency      Past Surgical History:  Procedure Laterality Date  . CESAREAN SECTION    . CESAREAN SECTION N/A 07/15/2017   Procedure: CESAREAN SECTION;  Surgeon: Jonnie Kind, MD;  Location: [place];  Service: Obstetrics;  Laterality: N/A;  . CHOLECYSTECTOMY      Family History  Problem Relation Age of Onset  . Diabetes Father   . Heart disease Father   . Alcoholism Father   . Drug abuse Father   . Obesity Father   . Colon cancer Maternal Grandmother   . Diabetes Maternal Grandmother   . Deep vein thrombosis Maternal Grandmother   . Pulmonary embolism Maternal Grandmother   .  Hypertension Mother   . Sleep apnea Mother   . Obesity Mother   . Esophageal cancer Neg Hx   . Rectal cancer Neg Hx   . Stomach cancer Neg Hx     Social History   Tobacco Use  . Smoking status: Never Smoker  . Smokeless tobacco: Never Used  Substance Use Topics  . Alcohol use: Yes    Comment: ocaasionally will have wine   . Drug use: No    Review of Systems  Constitutional: Negative for diaphoresis, fever and malaise/fatigue.  HENT: Negative for sore throat.   Eyes: Negative for blurred vision and double vision.  Respiratory: Negative for cough.   Cardiovascular: Negative for chest pain and palpitations.  Gastrointestinal: Negative for abdominal pain, constipation, diarrhea and vomiting.  Musculoskeletal: Negative for myalgias.  Skin: Negative for rash.  Neurological: Negative for dizziness, speech change, focal weakness, seizures and weakness.  Psychiatric/Behavioral: Negative for depression and substance abuse. The patient is nervous/anxious.      Objective:   Vitals: BP (!) 149/104 (BP Location: Left Arm) Comment: large cuff  Pulse 76   Temp 98.8 F (37.1 C) (Oral)   Resp 18   LMP 08/18/2019 (Exact Date)   SpO2 99%   Breastfeeding No   Wt Readings from Last 3 Encounters:  08/25/19 290 lb (131.5 kg)  07/14/19 296 lb (134.3 kg)  07/04/19 299  lb (135.6 kg)   Temp Readings from Last 3 Encounters:  08/26/19 98.8 F (37.1 C) (Oral)  08/25/19 98.2 F (36.8 C) (Oral)  07/14/19 98.5 F (36.9 C) (Oral)   BP Readings from Last 3 Encounters:  08/26/19 (!) 149/104  08/25/19 (!) 141/91  07/14/19 125/83   Pulse Readings from Last 3 Encounters:  08/26/19 76  08/25/19 79  07/14/19 86   Physical Exam Constitutional:      General: She is not in acute distress.    Appearance: Normal appearance. She is well-developed. She is obese. She is not ill-appearing, toxic-appearing or diaphoretic.  HENT:     Head: Normocephalic and atraumatic.     Nose: Nose normal.      Mouth/Throat:     Mouth: Mucous membranes are moist.  Eyes:     Extraocular Movements: Extraocular movements intact.     Pupils: Pupils are equal, round, and reactive to light.  Cardiovascular:     Rate and Rhythm: Normal rate and regular rhythm.     Pulses: Normal pulses.     Heart sounds: Normal heart sounds. No murmur. No friction rub. No gallop.   Pulmonary:     Effort: Pulmonary effort is normal. No respiratory distress.     Breath sounds: Normal breath sounds. No stridor. No wheezing, rhonchi or rales.  Musculoskeletal:        General: No tenderness. Normal range of motion.     Right lower leg: No edema.     Left lower leg: No edema.     Comments: Drink 5/5 throughout.  Skin:    General: Skin is warm and dry.     Findings: No rash.  Neurological:     Mental Status: She is alert and oriented to person, place, and time.     Cranial Nerves: No cranial nerve deficit.     Motor: No weakness.     Coordination: Coordination normal.     Gait: Gait normal.     Deep Tendon Reflexes: Reflexes normal.     Comments: Negative Romberg and pronator drift.  Psychiatric:        Mood and Affect: Mood normal.        Behavior: Behavior normal.        Thought Content: Thought content normal.        Judgment: Judgment normal.      Assessment and Plan :   1. Essential hypertension   2. Elevated blood pressure reading   3. Morbid obesity ([place])     Patient has very reassuring physical exam findings.  At this time I do not suspect that patient is having an intracranial process, stroke.  Recommended she start amlodipine and embrace dietary modifications. Counseled patient on potential for adverse effects with medications prescribed/recommended today, ER and return-to-clinic precautions discussed, patient verbalized understanding.    Jaynee Eagles, PA-C 08/26/19 1622

## 2019-08-26 NOTE — Discharge Instructions (Addendum)
For diabetes, please make sure you are avoiding starchy, carbohydrate foods like pasta, breads, pastry, rice, potatoes, desserts. These foods can elevated your blood sugar. Also, limit your alcohol drinking to 1 per day, avoid sodas, sweet teas. For elevated blood pressure, make sure you are monitoring salt in your diet.  Do not eat restaurant foods and limit processed foods at home.  Processed foods include things like frozen meals preseason meats and dinners.  Make sure your pain attention to sodium labels on foods you by at the grocery store.  For seasoning you can use a brand called Mrs. Dash which includes a lot of salt free seasonings.  Salads - kale, spinach, cabbage, spring mix; use seeds like pumpkin seeds or sunflower seeds, almonds; you can also use 1-2 hard boiled eggs in your salads Fruits - avocadoes, berries (blueberries, raspberries, blackberries), apples, oranges Vegetables - aspargus, cauliflower, broccoli, green beans, brussel spouts, bell peppers; stay away from starchy vegetables like potatoes, carrots, peas  Regarding meat it is better to eat lean meats and limit your red meat consumption including pork.  Wild caught fish, chicken breast are good options.  Do not eat any foods on this list that you are allergic to.

## 2019-09-09 IMAGING — US US MFM FETAL BPP W/O NON-STRESS
1 series · 14 of 28 positions shown · non-contrast
Comparison: none

[Series 1: us mfm fetal bpp w/o non-stress · 45 acquisitions, 14 frames shown]
[im 2/45]
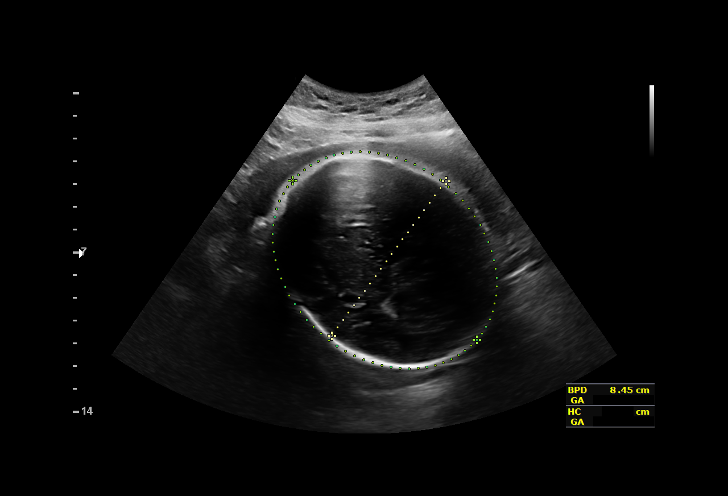
[im 5/45]
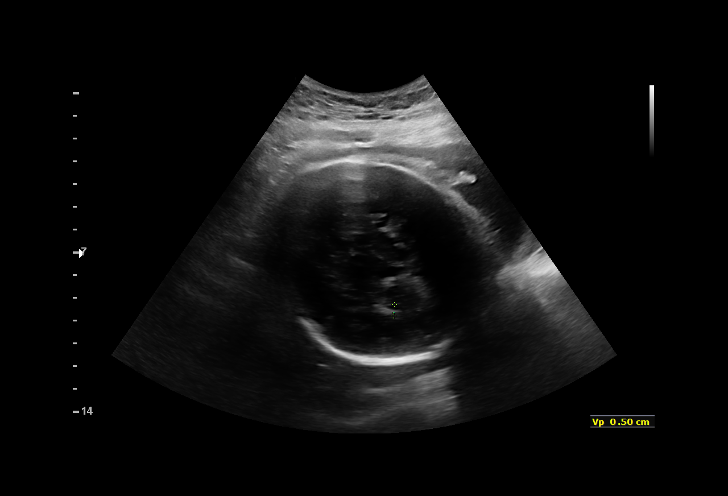
[im 9/45]
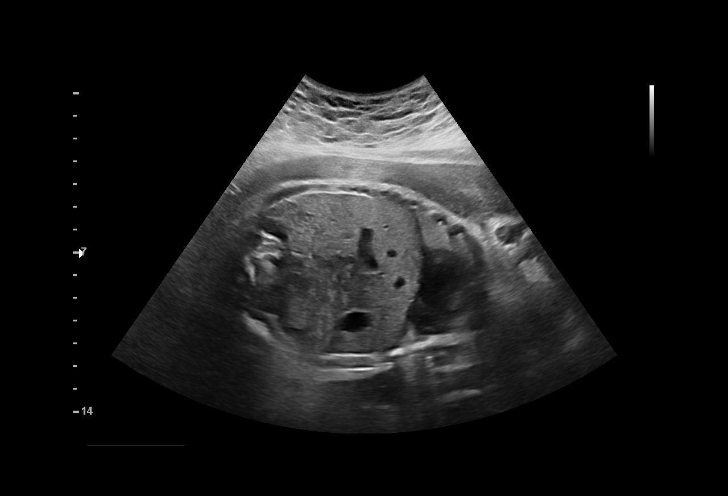
[im 12/45]
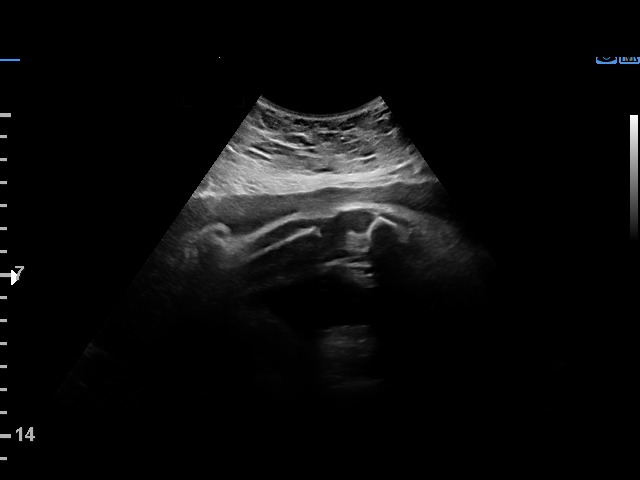
[im 15/45]
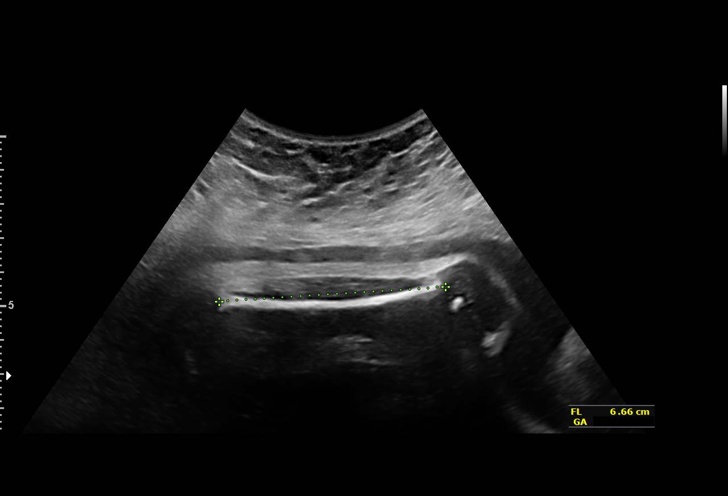
[im 18/45]
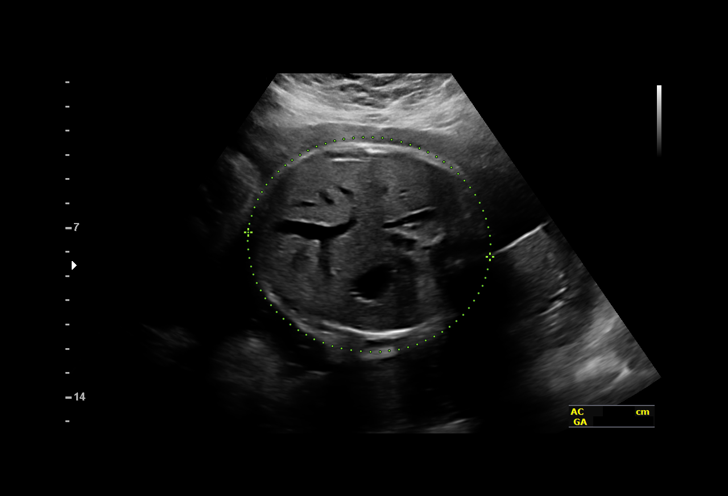
[im 22/45]
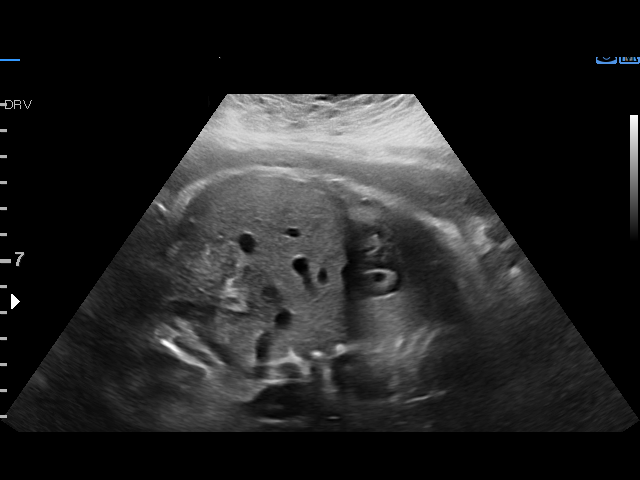
[im 25/45]
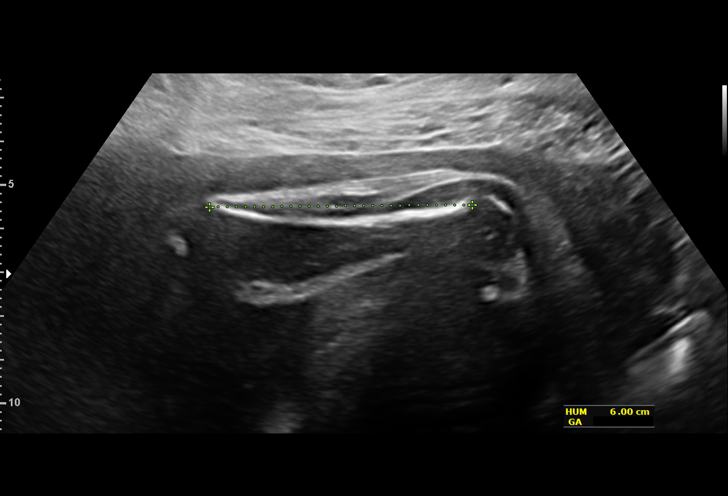
[im 28/45]
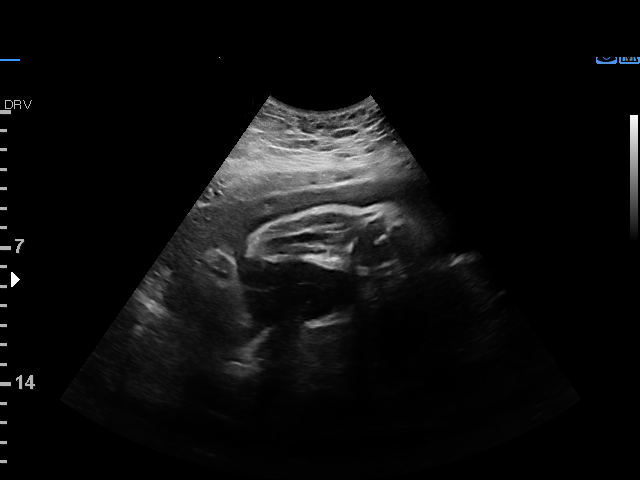
[im 31/45]
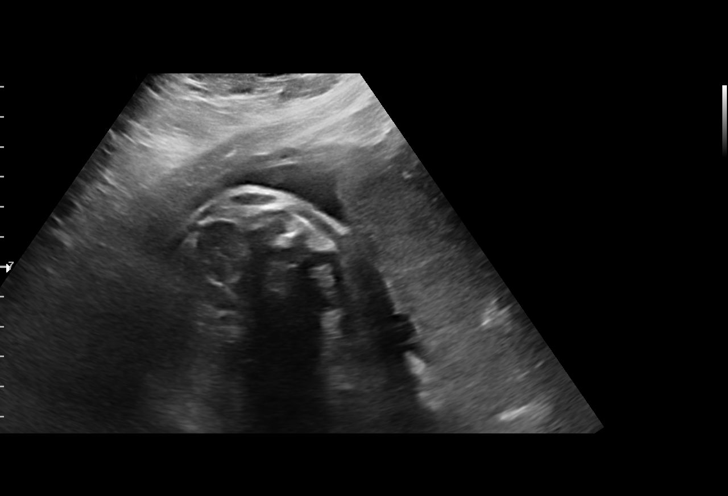
[im 35/45]
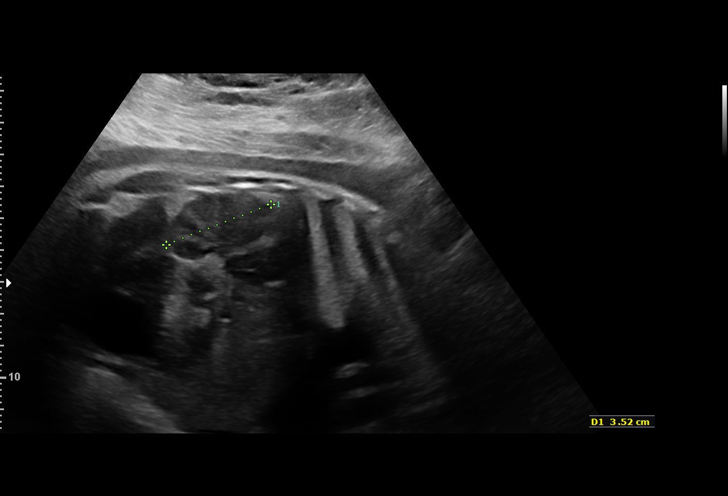
[im 38/45]
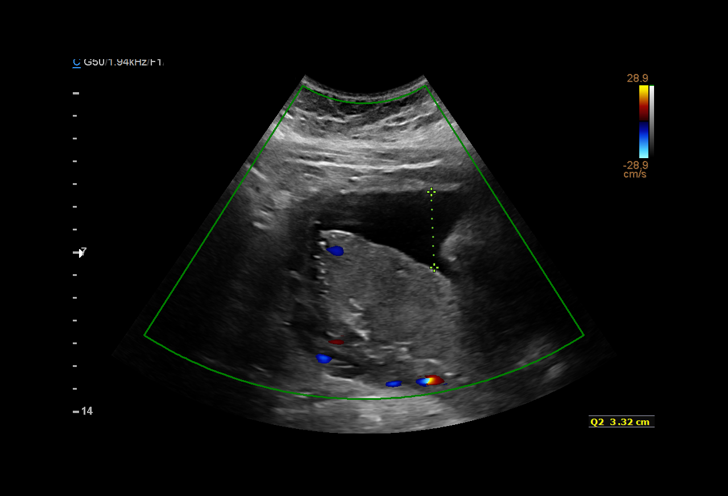
[im 41/45]
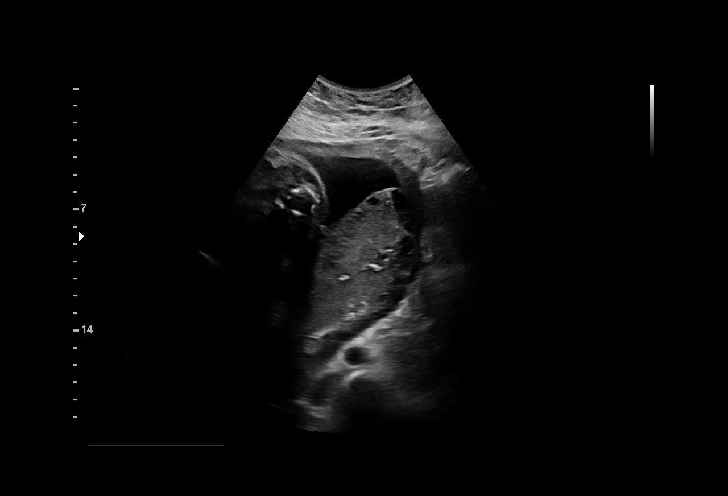
[im 45/45]
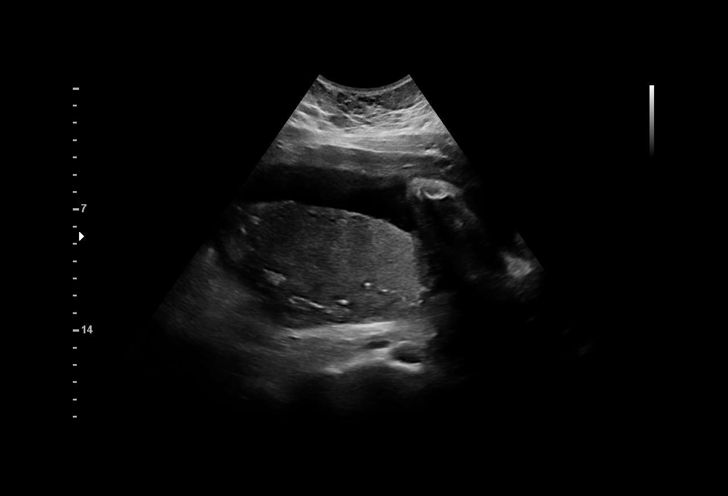

[14 of 28 positions shown; findings below may reference images not displayed]

for [REDACTED]care

2  SAMUEL OSEI HUCHI           [id number]      [id number]     [id number]
Indications

34 weeks gestation of pregnancy
Hypertension - Chronic/Pre-existing (ASA)
Previous cesarean delivery, antepartum
Encounter for other antenatal screening
follow-up
Obesity complicating pregnancy, third
trimester (pregravid BMI 52)
OB History

Gravidity:    3         Term:   1        Prem:   0        SAB:   1
TOP:          0       Ectopic:  0        Living: 1
Fetal Evaluation

Num Of Fetuses:     1
Fetal Heart         154
Rate(bpm):
Cardiac Activity:   Observed
Presentation:       Cephalic
Placenta:           Posterior, above cervical os
P. Cord Insertion:  Previously Visualized

Amniotic Fluid
AFI FV:      Subjectively within normal limits

AFI Sum(cm)     %Tile       Largest Pocket(cm)
14.42           51

RUQ(cm)       RLQ(cm)       LUQ(cm)        LLQ(cm)
4.65
Biophysical Evaluation

Amniotic F.V:   Within normal limits       F. Tone:        Observed
F. Movement:    Observed                   Score:          [DATE]
F. Breathing:   Observed
Biometry

BPD:      83.5  mm     G. Age:  33w 4d         26  %    CI:        76.57   %    70 - 86
FL/HC:      21.9   %    19.4 -
HC:      302.3  mm     G. Age:  33w 4d          6  %    HC/AC:      1.01        0.96 -
AC:      298.5  mm     G. Age:  33w 6d         38  %    FL/BPD:     79.2   %    71 - 87
FL:       66.1  mm     G. Age:  34w 0d         31  %    FL/AC:      22.1   %    20 - 24
HUM:      59.7  mm     G. Age:  34w 5d         64  %

Est. FW:    9913  gm      5 lb 1 oz     48  %
Gestational Age

LMP:           34w 3d        Date:  10/07/16                 EDD:   07/14/17
U/S Today:     33w 5d                                        EDD:   07/19/17
Best:          34w 3d     Det. By:  LMP  (10/07/16)          EDD:   07/14/17
Anatomy

Cranium:               Appears normal         Aortic Arch:            Previously seen
Cavum:                 Previously seen        Ductal Arch:            Previously seen
Ventricles:            Appears normal         Diaphragm:              Appears normal
Choroid Plexus:        Previously seen        Stomach:                Appears normal, left
sided
Cerebellum:            Previously seen        Abdomen:                Appears normal
Posterior Fossa:       Previously seen        Abdominal Wall:         Previously seen
Nuchal Fold:           Previously seen        Cord Vessels:           Previously seen
Face:                  Orbits and profile     Kidneys:                Appear normal
previously seen
Lips:                  Previously seen        Bladder:                Appears normal
Thoracic:              Appears normal         Spine:                  Previously seen
Heart:                 Previously seen        Upper Extremities:      Previously seen
RVOT:                  Previously seen        Lower Extremities:      Previously seen
LVOT:                  Previously seen

Other:  Fetus appears to be a female. Nasal bone visualized previously.
Technicallly difficult due to advanced GA, maternal habitus, and fetal
position.
Cervix Uterus Adnexa

Cervix
Not visualized (advanced GA >96wks)
Impression

Singleton intrauterine pregnancy at 34 weeks 3 days
gestation with fetal cardiac activity
Cephalic presentation
Posterior placenta
Normal appearing fetal growth
BPP [DATE] with an AFI > 14 cm
Recommendations

Continue antenatal testing

## 2019-09-14 ENCOUNTER — Encounter (INDEPENDENT_AMBULATORY_CARE_PROVIDER_SITE_OTHER): Payer: Self-pay

## 2019-09-15 ENCOUNTER — Ambulatory Visit (INDEPENDENT_AMBULATORY_CARE_PROVIDER_SITE_OTHER): Payer: 59 | Admitting: Family Medicine

## 2019-09-21 ENCOUNTER — Other Ambulatory Visit (INDEPENDENT_AMBULATORY_CARE_PROVIDER_SITE_OTHER): Payer: Self-pay | Admitting: Family Medicine

## 2019-09-21 DIAGNOSIS — I10 Essential (primary) hypertension: Secondary | ICD-10-CM

## 2019-09-30 ENCOUNTER — Other Ambulatory Visit: Payer: Self-pay | Admitting: General Surgery

## 2019-10-04 ENCOUNTER — Ambulatory Visit (INDEPENDENT_AMBULATORY_CARE_PROVIDER_SITE_OTHER): Payer: 59 | Admitting: Family Medicine

## 2019-10-06 ENCOUNTER — Other Ambulatory Visit: Payer: Self-pay

## 2019-10-06 ENCOUNTER — Telehealth: Payer: Self-pay | Admitting: Family

## 2019-10-06 ENCOUNTER — Ambulatory Visit (HOSPITAL_COMMUNITY)
Admission: RE | Admit: 2019-10-06 | Discharge: 2019-10-06 | Disposition: A | Discharge status: Home / Self Care | Payer: 59 | Source: Ambulatory Visit | Attending: General Surgery | Admitting: General Surgery

## 2019-10-06 NOTE — Telephone Encounter (Signed)
Patient called to cancel her appointments stating she did not need any further appts at this time. Per 3/11 phone call

## 2019-10-19 ENCOUNTER — Other Ambulatory Visit (INDEPENDENT_AMBULATORY_CARE_PROVIDER_SITE_OTHER): Payer: Self-pay | Admitting: Family Medicine

## 2019-10-19 DIAGNOSIS — I10 Essential (primary) hypertension: Secondary | ICD-10-CM

## 2019-10-26 ENCOUNTER — Encounter: Payer: Self-pay | Admitting: Dietician

## 2019-10-26 ENCOUNTER — Other Ambulatory Visit: Payer: Self-pay

## 2019-10-26 ENCOUNTER — Encounter: Payer: 59 | Attending: General Surgery | Admitting: Dietician

## 2019-10-26 DIAGNOSIS — E669 Obesity, unspecified: Secondary | ICD-10-CM | POA: Diagnosis present

## 2019-10-26 NOTE — Patient Instructions (Addendum)
Begin working through the [employer] discussed today, starting with the following:  . Track food and beverage intake (pen and paper, MyFitness Pal, Baritastic app, etc.) . Practice CHEWING your food (aim for applesauce consistency) . Practice not drinking 15 minutes before, during, and 30 minutes after each meal and snack

## 2019-10-26 NOTE — Progress Notes (Signed)
Nutrition Assessment for Bariatric Surgery Medical Nutrition Therapy   Patient was seen on 10/26/2019 for Pre-Operative Nutrition Assessment. Letter of approval faxed to [facility] bariatric surgery program coordinator on 10/26/2019.   Referral stated Supervised Weight Loss (SWL) visits needed: 0  Planned surgery: Sleeve Gastrectomy  Pt expectation of surgery: to lose weight, be more active, live longer to be with kids, feel better about self, feel better physically  Pt expectation of dietitian: dietary guidance before/after surgery    NUTRITION ASSESSMENT   Anthropometrics  Start weight at [facility]: 301 lbs (date: 10/26/2019) Height: 65 in BMI: 50 kg/m2     Lifestyle & Dietary Hx Patient works in a [employer]. Lives with her 2 children. States she experienced emotional eating in childhood, weight has been a challenge since a child. Typical meal pattern is 3 meals per day plus snacks. May have a biscuit or pizza for meals. May snack on fruit cups, crackers, 100 calorie nut packs. Doesn't care for eggs, otherwise not a picky eater.   24-Hr Dietary Recall First Meal: [place] sausage links + egg whites Snack: peanut butter crackers Second Meal: grilled chicken + vegetable + rice Snack: cookies  Third Meal: [employer] sandwich + fries  Snack: - Beverages: water, fruit punch   NUTRITION DIAGNOSIS  Overweight/obesity ([place]-3.3) related to past poor dietary habits and physical inactivity as evidenced by patient w/ planned Sleeve Gastrectomy surgery following dietary guidelines for continued weight loss.    NUTRITION INTERVENTION  Nutrition counseling (C-1) and education (E-2) to facilitate bariatric surgery goals.  Pre-Op Goals Reviewed with the Patient . Track food and beverage intake (pen and paper, MyFitness Pal, Baritastic app, etc.) . Make healthy food choices while monitoring portion sizes . Consume 3 meals per day or try to eat every 3-5 hours . Avoid concentrated  sugars and fried foods . Keep sugar & fat in the single digits per serving on food labels . Practice CHEWING your food (aim for applesauce consistency) . Practice not drinking 15 minutes before, during, and 30 minutes after each meal and snack . Avoid all carbonated beverages (ex: soda, sparkling beverages)  . Limit caffeinated beverages (ex: coffee, tea, energy drinks) . Avoid all sugar-sweetened beverages (ex: regular soda, sports drinks)  . Avoid alcohol  . Aim for 64-100 ounces of FLUID daily (with at least half of fluid intake being plain water)  . Aim for at least 60-80 grams of PROTEIN daily . Look for a liquid protein source that contains ?15 g protein and ?5 g carbohydrate (ex: shakes, drinks, shots) . Make a list of non-food related activities . Physical activity is an important part of a healthy lifestyle so keep it moving! The goal is to reach 150 minutes of exercise per week, including cardiovascular and weight baring activity.  *Goals that are bolded indicate the pt would like to start working towards these  Handouts Provided Include  . Bariatric Surgery handouts (Nutrition Visits, Pre-Op Goals, Protein Shakes, Vitamins & Minerals)  Learning Style & Readiness for Change Teaching method utilized: Visual & Auditory  Demonstrated degree of understanding via: Teach Back  Barriers to learning/adherence to lifestyle change: None Identified   MONITORING & EVALUATION Dietary intake, weekly physical activity, body weight, and pre-op goals reached at next nutrition visit.   Next Steps Patient is to follow up at [facility] for Pre-Op Class (>2 weeks before surgery) for further nutrition education.

## 2019-10-31 ENCOUNTER — Encounter: Payer: Self-pay | Admitting: Family Medicine

## 2019-10-31 ENCOUNTER — Ambulatory Visit: Payer: 59 | Admitting: Family Medicine

## 2019-10-31 ENCOUNTER — Other Ambulatory Visit: Payer: Self-pay

## 2019-10-31 VITALS — BP 118/78 | HR 76 | Temp 98.1°F | Ht 65.0 in | Wt 300.6 lb

## 2019-10-31 DIAGNOSIS — R7303 Prediabetes: Secondary | ICD-10-CM | POA: Diagnosis not present

## 2019-10-31 DIAGNOSIS — I10 Essential (primary) hypertension: Secondary | ICD-10-CM

## 2019-10-31 DIAGNOSIS — E559 Vitamin D deficiency, unspecified: Secondary | ICD-10-CM | POA: Diagnosis not present

## 2019-10-31 MED ORDER — AMLODIPINE BESYLATE 5 MG PO TABS: 5.0000 mg | ORAL_TABLET | Freq: Every day | ORAL | 2 refills | Status: DC

## 2019-10-31 NOTE — Progress Notes (Signed)
Established Patient Office Visit  Subjective:  Patient ID: Courtney Mooney, female    DOB: Jan 16, 1986  Age: 34 y.o. MRN: [id number]  CC:  Chief Complaint  Patient presents with  . Follow-up    refill/follow up on medication.     HPI [place] presents for establishment of care and follow-up of her hypertension.  Blood pressures been well controlled with Norvasc.  She has had no issues taking it.  Currently under work-up for bariatric surgery 3 and her BP is checked for various appointments have been running in the 120/80 range.  She is tolerating the drug well.  History of vitamin D deficiency.  She has been take 400 IUs daily.  History of prediabetes.  She is in the final stages of approval for her bariatric surgery and hopefully will receive surgery sometime in May.  She does not smoke or use illicit drugs.  She occasionally drinks wine.  Past Medical History:  Diagnosis Date  . Anemia   . Anxiety   . HSV-2 (herpes simplex virus 2) infection   . Hypertension   . PCOS (polycystic ovarian syndrome)   . Prediabetes   . Vaginal Pap smear, abnormal   . Vitamin D deficiency     Past Surgical History:  Procedure Laterality Date  . CESAREAN SECTION    . CESAREAN SECTION N/A 07/15/2017   Procedure: CESAREAN SECTION;  Surgeon: Jonnie Kind, MD;  Location: [place];  Service: Obstetrics;  Laterality: N/A;  . CHOLECYSTECTOMY      Family History  Problem Relation Age of Onset  . Diabetes Father   . Heart disease Father   . Alcoholism Father   . Drug abuse Father   . Obesity Father   . Colon cancer Maternal Grandmother   . Diabetes Maternal Grandmother   . Deep vein thrombosis Maternal Grandmother   . Pulmonary embolism Maternal Grandmother   . Hypertension Mother   . Sleep apnea Mother   . Obesity Mother   . Esophageal cancer Neg Hx   . Rectal cancer Neg Hx   . Stomach cancer Neg Hx     Social History   Socioeconomic History  . Marital status:  Divorced    Spouse name: Not on file  . Number of children: 2  . Years of education: Not on file  . Highest education level: Not on file  Occupational History  . Occupation: Medical office/ clerical  Tobacco Use  . Smoking status: Never Smoker  . Smokeless tobacco: Never Used  Substance and Sexual Activity  . Alcohol use: Yes    Comment: ocaasionally will have wine   . Drug use: No  . Sexual activity: Yes    Birth control/protection: None  Other Topics Concern  . Not on file  Social History Narrative  . Not on file   Social Determinants of Health   Financial Resource Strain:   . Difficulty of Paying Living Expenses:   Food Insecurity:   . Worried About Charity fundraiser in the Last Year:   . Arboriculturist in the Last Year:   Transportation Needs:   . Film/video editor (Medical):   Marland Kitchen Lack of Transportation (Non-Medical):   Physical Activity:   . Days of Exercise per Week:   . Minutes of Exercise per Session:   Stress:   . Feeling of Stress :   Social Connections:   . Frequency of Communication with Friends and Family:   . Frequency of Social  Gatherings with Friends and Family:   . Attends Religious Services:   . Active Member of Clubs or Organizations:   . Attends Archivist Meetings:   Marland Kitchen Marital Status:   Intimate Partner Violence:   . Fear of Current or Ex-Partner:   . Emotionally Abused:   Marland Kitchen Physically Abused:   . Sexually Abused:     Outpatient Medications Prior to Visit  Medication Sig Dispense Refill  . norethindrone (MICRONOR) 0.35 MG tablet Take 1 tablet (0.35 mg total) by mouth daily. 84 tablet 3  . Vitamin D, Ergocalciferol, (DRISDOL) 1.25 MG (50000 UT) CAPS capsule Take 1 capsule (50,000 Units total) by mouth every 7 (seven) days. 4 capsule 0  . amLODipine (NORVASC) 5 MG tablet Take 1 tablet by mouth once daily 30 tablet 0   No facility-administered medications prior to visit.    No Known Allergies  ROS Review of Systems   Constitutional: Negative for chills, diaphoresis, fatigue, fever and unexpected weight change.  HENT: Negative.   Eyes: Negative for photophobia and visual disturbance.  Respiratory: Negative.   Cardiovascular: Negative.   Gastrointestinal: Negative.   Genitourinary: Negative.   Neurological: Negative.   Hematological: Negative.   Psychiatric/Behavioral: Negative.       Objective:    Physical Exam  Constitutional: She is oriented to person, place, and time. She appears well-developed and well-nourished. No distress.  HENT:  Head: Normocephalic and atraumatic.  Right Ear: External ear normal.  Left Ear: External ear normal.  Eyes: Conjunctivae are normal. Right eye exhibits no discharge. Left eye exhibits no discharge. No scleral icterus.  Neck: No JVD present. No tracheal deviation present. No thyromegaly present.  Cardiovascular: Normal rate, regular rhythm and normal heart sounds.  Pulmonary/Chest: Effort normal and breath sounds normal. No stridor.  Abdominal: Bowel sounds are normal.  Musculoskeletal:        General: No edema.     Cervical back: Neck supple.  Lymphadenopathy:    She has no cervical adenopathy.  Neurological: She is alert and oriented to person, place, and time.  Skin: Skin is warm and dry. She is not diaphoretic.  Psychiatric: She has a normal mood and affect. Her behavior is normal.    BP 118/78   Pulse 76   Temp 98.1 F (36.7 C) (Tympanic)   Ht 5\' 5"  (1.651 m)   Wt (!) 300 lb 9.6 oz (136.4 kg)   SpO2 97%   BMI 50.02 kg/m  Wt Readings from Last 3 Encounters:  10/31/19 (!) 300 lb 9.6 oz (136.4 kg)  10/26/19 (!) 301 lb (136.5 kg)  08/25/19 290 lb (131.5 kg)     There are no preventive care reminders to display for this patient.  There are no preventive care reminders to display for this patient.  Lab Results  Component Value Date   TSH 1.350 05/17/2019   Lab Results  Component Value Date   WBC 11.5 (H) 05/11/2019   HGB 12.6  05/11/2019   HCT 41.2 05/11/2019   MCV 86.0 05/11/2019   PLT 377 05/11/2019   Lab Results  Component Value Date   NA 142 05/17/2019   K 4.2 05/17/2019   CO2 25 05/17/2019   GLUCOSE 91 05/17/2019   BUN 15 05/17/2019   CREATININE 0.88 05/17/2019   BILITOT 0.2 05/17/2019   ALKPHOS 111 05/17/2019   AST 11 05/17/2019   ALT 9 05/17/2019   PROT 7.2 05/17/2019   ALBUMIN 3.9 05/17/2019   CALCIUM 9.1 05/17/2019  ANIONGAP 6 05/11/2019   GFR 101.64 05/02/2019   Lab Results  Component Value Date   CHOL 145 05/02/2019   Lab Results  Component Value Date   HDL 34.00 (L) 05/02/2019   Lab Results  Component Value Date   LDLCALC 102 (H) 05/02/2019   Lab Results  Component Value Date   TRIG 42.0 05/02/2019   Lab Results  Component Value Date   CHOLHDL 4 05/02/2019   Lab Results  Component Value Date   HGBA1C 6.3 05/02/2019      Assessment & Plan:   Problem List Items Addressed This Visit      Cardiovascular and Mediastinum   Essential hypertension - Primary   Relevant Medications   amLODipine (NORVASC) 5 MG tablet   Other Relevant Orders   CBC   Comprehensive metabolic panel     Other   Vitamin D deficiency   Relevant Orders   VITAMIN D 25 Hydroxy (Vit-D Deficiency, Fractures)   Prediabetes   Relevant Orders   Hemoglobin A1c   Comprehensive metabolic panel      Meds ordered this encounter  Medications  . amLODipine (NORVASC) 5 MG tablet    Sig: Take 1 tablet (5 mg total) by mouth daily.    Dispense:  90 tablet    Refill:  2    Follow-up: Return in about 6 months (around 05/01/2020), or Nice to meet you. Good luch with yoursurgery!Libby Maw, MD

## 2019-10-31 NOTE — Patient Instructions (Signed)
Preventing Hypertension Hypertension, commonly called high blood pressure, is when the force of blood pumping through the arteries is too strong. Arteries are blood vessels that carry blood from the heart throughout the body. Over time, hypertension can damage the arteries and decrease blood flow to important parts of the body, including the brain, heart, and kidneys. Often, hypertension does not cause symptoms until blood pressure is very high. For this reason, it is important to have your blood pressure checked on a regular basis. Hypertension can often be prevented with diet and lifestyle changes. If you already have hypertension, you can control it with diet and lifestyle changes, as well as medicine. What nutrition changes can be made? Maintain a healthy diet. This includes:  Eating less salt (sodium). Ask your health care provider how much sodium is safe for you to have. The general recommendation is to consume less than 1 tsp (2,300 mg) of sodium a day. ? Do not add salt to your food. ? Choose low-sodium options when grocery shopping and eating out.  Limiting fats in your diet. You can do this by eating low-fat or fat-free dairy products and by eating less red meat.  Eating more fruits, vegetables, and whole grains. Make a goal to eat: ? 1-2 cups of fresh fruits and vegetables each day. ? 3-4 servings of whole grains each day.  Avoiding foods and beverages that have added sugars.  Eating fish that contain healthy fats (omega-3 fatty acids), such as mackerel or salmon. If you need help putting together a healthy eating plan, try the DASH diet. This diet is high in fruits, vegetables, and whole grains. It is low in sodium, red meat, and added sugars. DASH stands for Dietary Approaches to Stop Hypertension. What lifestyle changes can be made?   Lose weight if you are overweight. Losing just 3?5% of your body weight can help prevent or control hypertension. ? For example, if your present  weight is 200 lb (91 kg), a loss of 3-5% of your weight means losing 6-10 lb (2.7-4.5 kg). ? Ask your health care provider to help you with a diet and exercise plan to safely lose weight.  Get enough exercise. Do at least 150 minutes of moderate-intensity exercise each week. ? You could do this in short exercise sessions several times a day, or you could do longer exercise sessions a few times a week. For example, you could take a brisk 10-minute walk or bike ride, 3 times a day, for 5 days a week.  Find ways to reduce stress, such as exercising, meditating, listening to music, or taking a yoga class. If you need help reducing stress, ask your health care provider.  Do not smoke. This includes e-cigarettes. Chemicals in tobacco and nicotine products raise your blood pressure each time you smoke. If you need help quitting, ask your health care provider.  Avoid alcohol. If you drink alcohol, limit alcohol intake to no more than 1 drink a day for nonpregnant women and 2 drinks a day for men. One drink equals 12 oz of beer, 5 oz of wine, or 1 oz of hard liquor. Why are these changes important? Diet and lifestyle changes can help you prevent hypertension, and they may make you feel better overall and improve your quality of life. If you have hypertension, making these changes will help you control it and help prevent major complications, such as:  Hardening and narrowing of arteries that supply blood to: ? Your heart. This can cause a heart  attack. ? Your brain. This can cause a stroke. ? Your kidneys. This can cause kidney failure.  Stress on your heart muscle, which can cause heart failure. What can I do to lower my risk?  Work with your health care provider to make a hypertension prevention plan that works for you. Follow your plan and keep all follow-up visits as told by your health care provider.  Learn how to check your blood pressure at home. Make sure that you know your personal target  blood pressure, as told by your health care provider. How is this treated? In addition to diet and lifestyle changes, your health care provider may recommend medicines to help lower your blood pressure. You may need to try a few different medicines to find what works best for you. You also may need to take more than one medicine. Take over-the-counter and prescription medicines only as told by your health care provider. Where to find support Your health care provider can help you prevent hypertension and help you keep your blood pressure at a healthy level. Your local hospital or your community may also provide support services and prevention programs. The American Heart Association offers an online support network at: http://supportnetwork.heart.org/high-blood-pressure Where to find more information Learn more about hypertension from:  National Heart, Lung, and Blood Institute: www.nhlbi.nih.gov/health/health-topics/topics/hbp  Centers for Disease Control and Prevention: www.cdc.gov/bloodpressure  American Academy of Family Physicians: http://familydoctor.org/familydoctor/en/diseases-conditions/high-blood-pressure.printerview.all.html Learn more about the DASH diet from:  National Heart, Lung, and Blood Institute: www.nhlbi.nih.gov/health/health-topics/topics/dash Contact a health care provider if:  You think you are having a reaction to medicines you have taken.  You have recurrent headaches or feel dizzy.  You have swelling in your ankles.  You have trouble with your vision. Summary  Hypertension often does not cause any symptoms until blood pressure is very high. It is important to get your blood pressure checked regularly.  Diet and lifestyle changes are the most important steps in preventing hypertension.  By keeping your blood pressure in a healthy range, you can prevent complications like heart attack, heart failure, stroke, and kidney failure.  Work with your health care  provider to make a hypertension prevention plan that works for you. This information is not intended to replace advice given to you by your health care provider. Make sure you discuss any questions you have with your health care provider. Document Revised: 11/05/2018 Document Reviewed: 03/24/2016 Elsevier Patient Education  2020 Elsevier Inc.  

## 2019-11-01 LAB — COMPREHENSIVE METABOLIC PANEL
ALT: 10 U/L (ref 0–35)
AST: 17 U/L (ref 0–37)
Albumin: 3.5 g/dL (ref 3.5–5.2)
Alkaline Phosphatase: 84 U/L (ref 39–117)
BUN: 21 mg/dL (ref 6–23)
CO2: 30 mEq/L (ref 19–32)
Calcium: 8.8 mg/dL (ref 8.4–10.5)
Chloride: 105 mEq/L (ref 96–112)
Creatinine, Ser: 0.92 mg/dL (ref 0.40–1.20)
GFR: 83.77 mL/min (ref >60.00)
Glucose, Bld: 87 mg/dL (ref 70–99)
Potassium: 3.8 mEq/L (ref 3.5–5.1)
Sodium: 140 mEq/L (ref 135–145)
Total Bilirubin: 0.3 mg/dL (ref 0.2–1.2)
Total Protein: 6.8 g/dL (ref 6.0–8.3)

## 2019-11-01 LAB — CBC
HCT: 37.9 % (ref 36.0–46.0)
Hemoglobin: 12.1 g/dL (ref 12.0–15.0)
MCHC: 31.8 g/dL (ref 30.0–36.0)
MCV: 83.5 fl (ref 78.0–100.0)
Platelets: 399 10*3/uL (ref 150.0–400.0)
RBC: 4.54 Mil/uL (ref 3.87–5.11)
RDW: 15 % (ref 11.5–15.5)
WBC: 8.8 10*3/uL (ref 4.0–10.5)

## 2019-11-01 LAB — VITAMIN D 25 HYDROXY (VIT D DEFICIENCY, FRACTURES): VITD: 20.25 ng/mL — ABNORMAL LOW (ref 30.00–100.00)

## 2019-11-01 LAB — HEMOGLOBIN A1C: Hgb A1c MFr Bld: 6 % (ref 4.6–6.5)

## 2019-11-02 ENCOUNTER — Other Ambulatory Visit: Payer: Self-pay

## 2019-11-02 DIAGNOSIS — E559 Vitamin D deficiency, unspecified: Secondary | ICD-10-CM

## 2019-11-02 MED ORDER — VITAMIN D (ERGOCALCIFEROL) 1.25 MG (50000 UNIT) PO CAPS: 50000.0000 [IU] | ORAL_CAPSULE | ORAL | 1 refills | Status: DC

## 2019-11-09 ENCOUNTER — Other Ambulatory Visit: Payer: 59

## 2019-11-09 ENCOUNTER — Ambulatory Visit: Payer: 59 | Admitting: Family

## 2019-11-10 ENCOUNTER — Ambulatory Visit (INDEPENDENT_AMBULATORY_CARE_PROVIDER_SITE_OTHER): Payer: 59 | Admitting: Psychology

## 2019-11-10 DIAGNOSIS — F509 Eating disorder, unspecified: Secondary | ICD-10-CM | POA: Diagnosis not present

## 2019-11-28 ENCOUNTER — Encounter: Payer: 59 | Attending: General Surgery | Admitting: Skilled Nursing Facility1

## 2019-11-28 ENCOUNTER — Other Ambulatory Visit: Payer: Self-pay

## 2019-11-28 DIAGNOSIS — E669 Obesity, unspecified: Secondary | ICD-10-CM | POA: Insufficient documentation

## 2019-11-28 NOTE — Progress Notes (Signed)
Pre-Operative Nutrition Class:  Appt start time: 3361   End time:  1830.  Patient was seen on 11/28/2019 for Pre-Operative Bariatric Surgery Education [facility].    Surgery date:  Surgery type: sleeve Start weight at [facility]: 301 Weight today: 304.1   The following the learning objectives were met by the patient during this course:  Identify Pre-Op Dietary Goals and will begin 2 weeks pre-operatively  Identify appropriate sources of fluids and proteins   State protein recommendations and appropriate sources pre and post-operatively  Identify Post-Operative Dietary Goals and will follow for 2 weeks post-operatively  Identify appropriate multivitamin and calcium sources  Describe the need for physical activity post-operatively and will follow MD recommendations  State when to call healthcare provider regarding medication questions or post-operative complications  Handouts given during class include:  Pre-Op Bariatric Surgery Diet Handout  Protein Shake Handout  Post-Op Bariatric Surgery Nutrition Handout  [facility] Program Information Flyer  Support Group Information Flyer  [facility] Bariatric Supplements Price List  Follow-Up Plan: Patient will follow-up at [facility] 2 weeks post operatively for diet advancement per MD.

## 2019-11-30 ENCOUNTER — Ambulatory Visit (INDEPENDENT_AMBULATORY_CARE_PROVIDER_SITE_OTHER): Payer: 59 | Admitting: Psychology

## 2019-11-30 DIAGNOSIS — F509 Eating disorder, unspecified: Secondary | ICD-10-CM

## 2020-02-09 ENCOUNTER — Ambulatory Visit (HOSPITAL_BASED_OUTPATIENT_CLINIC_OR_DEPARTMENT_OTHER): Payer: 59 | Admitting: Family Medicine

## 2020-02-09 ENCOUNTER — Encounter: Payer: Self-pay | Admitting: Family Medicine

## 2020-02-09 ENCOUNTER — Other Ambulatory Visit: Payer: Self-pay

## 2020-02-09 ENCOUNTER — Other Ambulatory Visit (HOSPITAL_COMMUNITY)
Admission: RE | Admit: 2020-02-09 | Discharge: 2020-02-09 | Disposition: A | Discharge status: Home / Self Care | Payer: 59 | Source: Ambulatory Visit | Attending: Family Medicine | Admitting: Family Medicine

## 2020-02-09 VITALS — BP 120/80 | HR 74 | Ht 65.0 in | Wt 308.0 lb

## 2020-02-09 DIAGNOSIS — Z793 Long term (current) use of hormonal contraceptives: Secondary | ICD-10-CM

## 2020-02-09 DIAGNOSIS — Z01419 Encounter for gynecological examination (general) (routine) without abnormal findings: Secondary | ICD-10-CM | POA: Insufficient documentation

## 2020-02-09 DIAGNOSIS — Z113 Encounter for screening for infections with a predominantly sexual mode of transmission: Secondary | ICD-10-CM

## 2020-02-09 DIAGNOSIS — Z3041 Encounter for surveillance of contraceptive pills: Secondary | ICD-10-CM

## 2020-02-09 MED ORDER — NORETHINDRONE 0.35 MG PO TABS: 1.0000 | ORAL_TABLET | Freq: Every day | ORAL | 3 refills | Status: DC

## 2020-02-09 NOTE — Progress Notes (Signed)
GYNECOLOGY ANNUAL PREVENTATIVE CARE ENCOUNTER NOTE  Subjective:   Courtney Mooney is a 34 y.o. [phone] female here for a routine annual gynecologic exam.  Current complaints: none.   Denies abnormal vaginal bleeding, discharge, pelvic pain, problems with intercourse or other gynecologic concerns.    Gynecologic History No LMP recorded. (Menstrual status: Oral contraceptives). Patient is sexually active  Contraception: oral progesterone-only contraceptive Last Pap: 2018. Results were: normal Last mammogram: n/a.  Obstetric History OB History  Gravida Para Term Preterm AB Living  3 2 2   1 2   SAB TAB Ectopic Multiple Live Births  1     0 2    # Outcome Date GA Lbr Len/2nd Weight Sex Delivery Anes PTL Lv  3 Term 07/15/17 [redacted]w[redacted]d  7 lb 4.9 oz (3.315 kg) F CS-LTranv Spinal  LIV  2 Term      CS-LTranv Spinal  LIV  1 SAB             Past Medical History:  Diagnosis Date  . Anemia   . Anxiety   . HSV-2 (herpes simplex virus 2) infection   . Hypertension   . PCOS (polycystic ovarian syndrome)   . Prediabetes   . Vaginal Pap smear, abnormal   . Vitamin D deficiency     Past Surgical History:  Procedure Laterality Date  . CESAREAN SECTION    . CESAREAN SECTION N/A 07/15/2017   Procedure: CESAREAN SECTION;  Surgeon: Jonnie Kind, MD;  Location: [place];  Service: Obstetrics;  Laterality: N/A;  . CHOLECYSTECTOMY      Current Outpatient Medications on File Prior to Visit  Medication Sig Dispense Refill  . amLODipine (NORVASC) 5 MG tablet Take 1 tablet (5 mg total) by mouth daily. 90 tablet 2  . norethindrone (MICRONOR) 0.35 MG tablet Take 1 tablet (0.35 mg total) by mouth daily. 84 tablet 3  . Vitamin D, Ergocalciferol, (DRISDOL) 1.25 MG (50000 UNIT) CAPS capsule Take 1 capsule (50,000 Units total) by mouth every 7 (seven) days. 4 capsule 1   No current facility-administered medications on file prior to visit.    No Known Allergies  Social History    Socioeconomic History  . Marital status: Divorced    Spouse name: Not on file  . Number of children: 2  . Years of education: Not on file  . Highest education level: Not on file  Occupational History  . Occupation: Medical office/ clerical  Tobacco Use  . Smoking status: Never Smoker  . Smokeless tobacco: Never Used  Vaping Use  . Vaping Use: Never used  Substance and Sexual Activity  . Alcohol use: Yes    Comment: ocaasionally will have wine   . Drug use: No  . Sexual activity: Yes    Birth control/protection: None  Other Topics Concern  . Not on file  Social History Narrative  . Not on file   Social Determinants of Health   Financial Resource Strain:   . Difficulty of Paying Living Expenses:   Food Insecurity:   . Worried About Charity fundraiser in the Last Year:   . Arboriculturist in the Last Year:   Transportation Needs:   . Film/video editor (Medical):   Marland Kitchen Lack of Transportation (Non-Medical):   Physical Activity:   . Days of Exercise per Week:   . Minutes of Exercise per Session:   Stress:   . Feeling of Stress :   Social Connections:   .  Frequency of Communication with Friends and Family:   . Frequency of Social Gatherings with Friends and Family:   . Attends Religious Services:   . Active Member of Clubs or Organizations:   . Attends Archivist Meetings:   Marland Kitchen Marital Status:   Intimate Partner Violence:   . Fear of Current or Ex-Partner:   . Emotionally Abused:   Marland Kitchen Physically Abused:   . Sexually Abused:     Family History  Problem Relation Age of Onset  . Diabetes Father   . Heart disease Father   . Alcoholism Father   . Drug abuse Father   . Obesity Father   . Colon cancer Maternal Grandmother   . Diabetes Maternal Grandmother   . Deep vein thrombosis Maternal Grandmother   . Pulmonary embolism Maternal Grandmother   . Hypertension Mother   . Sleep apnea Mother   . Obesity Mother   . Esophageal cancer Neg Hx   . Rectal  cancer Neg Hx   . Stomach cancer Neg Hx     The following portions of the patient's history were reviewed and updated as appropriate: allergies, current medications, past family history, past medical history, past social history, past surgical history and problem list.  Review of Systems Pertinent items are noted in HPI.   Objective:  BP 120/80   Pulse 74   Ht 5\' 5"  (1.651 m)   Wt (!) 308 lb (139.7 kg)   BMI 51.25 kg/m  Wt Readings from Last 3 Encounters:  02/09/20 (!) 308 lb (139.7 kg)  11/28/19 (!) 304 lb 1.6 oz (137.9 kg)  10/31/19 (!) 300 lb 9.6 oz (136.4 kg)     Chaperone present during exam  CONSTITUTIONAL: Well-developed, well-nourished female in no acute distress.  HENT:  Normocephalic, atraumatic, External right and left ear normal. Oropharynx is clear and moist EYES: Conjunctivae and EOM are normal. Pupils are equal, round, and reactive to light. No scleral icterus.  NECK: Normal range of motion, supple, no masses.  Normal thyroid.   CARDIOVASCULAR: Normal heart rate noted, regular rhythm RESPIRATORY: Clear to auscultation bilaterally. Effort and breath sounds normal, no problems with respiration noted. BREASTS: Symmetric in size. No masses, skin changes, nipple drainage, or lymphadenopathy. ABDOMEN: Soft, normal bowel sounds, no distention noted.  No tenderness, rebound or guarding.  PELVIC: Normal appearing external genitalia; normal appearing vaginal mucosa and cervix.  No abnormal discharge noted.  Normal uterine size, no other palpable masses, no uterine or adnexal tenderness. MUSCULOSKELETAL: Normal range of motion. No tenderness.  No cyanosis, clubbing, or edema.  2+ distal pulses. SKIN: Skin is warm and dry. No rash noted. Not diaphoretic. No erythema. No pallor. NEUROLOGIC: Alert and oriented to person, place, and time. Normal reflexes, muscle tone coordination. No cranial nerve deficit noted. PSYCHIATRIC: Normal mood and affect. Normal behavior. Normal  judgment and thought content.  Assessment:  Annual gynecologic examination with pap smear   Plan:  1. Well Woman Exam Will follow up results of pap smear and manage accordingly. STD testing discussed. Patient requested testing - Cytology - PAP( [place]) - Hepatitis B Surface AntiGEN - Hepatitis C Antibody - RPR - HIV antibody (with reflex)  2. Screening for STD (sexually transmitted disease) - Hepatitis B Surface AntiGEN - Hepatitis C Antibody - RPR - HIV antibody (with reflex)   Routine preventative health maintenance measures emphasized. Please refer to After Visit Summary for other counseling recommendations.    Loma Boston, [place] for [employer]

## 2020-02-10 LAB — RPR: RPR Ser Ql: NONREACTIVE

## 2020-02-10 LAB — HIV ANTIBODY (ROUTINE TESTING W REFLEX): HIV Screen 4th Generation wRfx: NONREACTIVE

## 2020-02-10 LAB — HEPATITIS C ANTIBODY: Hep C Virus Ab: <0.1 s/co ratio (ref 0.0–0.9)

## 2020-02-10 LAB — HEPATITIS B SURFACE ANTIGEN: Hepatitis B Surface Ag: NEGATIVE

## 2020-02-13 LAB — CYTOLOGY - PAP
Adequacy: ABSENT
Chlamydia: NEGATIVE
Comment: NEGATIVE
Comment: NEGATIVE
Comment: NORMAL
Diagnosis: NEGATIVE
High risk HPV: NEGATIVE
Neisseria Gonorrhea: NEGATIVE

## 2020-05-02 ENCOUNTER — Other Ambulatory Visit: Payer: Self-pay

## 2020-05-02 ENCOUNTER — Encounter: Payer: Self-pay | Admitting: Family Medicine

## 2020-05-02 ENCOUNTER — Ambulatory Visit (INDEPENDENT_AMBULATORY_CARE_PROVIDER_SITE_OTHER): Payer: No Typology Code available for payment source | Admitting: Family Medicine

## 2020-05-02 VITALS — BP 126/78 | HR 78 | Temp 98.7°F | Ht 65.0 in | Wt 311.4 lb

## 2020-05-02 DIAGNOSIS — Z6841 Body Mass Index (BMI) 40.0 and over, adult: Secondary | ICD-10-CM | POA: Diagnosis not present

## 2020-05-02 DIAGNOSIS — Z0001 Encounter for general adult medical examination with abnormal findings: Secondary | ICD-10-CM

## 2020-05-02 DIAGNOSIS — Z0000 Encounter for general adult medical examination without abnormal findings: Secondary | ICD-10-CM

## 2020-05-02 DIAGNOSIS — E559 Vitamin D deficiency, unspecified: Secondary | ICD-10-CM

## 2020-05-02 DIAGNOSIS — Z9189 Other specified personal risk factors, not elsewhere classified: Secondary | ICD-10-CM

## 2020-05-02 DIAGNOSIS — R0683 Snoring: Secondary | ICD-10-CM

## 2020-05-02 DIAGNOSIS — I10 Essential (primary) hypertension: Secondary | ICD-10-CM

## 2020-05-02 NOTE — Progress Notes (Signed)
Established Patient Office Visit  Subjective:  Patient ID: Courtney Mooney, female    DOB: 02/29/1986  Age: 34 y.o. MRN: [id number]  CC:  Chief Complaint  Patient presents with  . Follow-up    HPI [place] presents for physical exam, follow-up of hypertension, increased risk for diabetes, vitamin D deficiency.  Patient does snore.  Her mother does have sleep apnea.  Patient had been approved for bariatric surgery but the insurance changed she had to restart her deductible..  Blood pressures well controlled with the Norvasc and she is tolerating it well.  She is up-to-date on her female care.  Care gaps and immunization records were reviewed.  Past Medical History:  Diagnosis Date  . Anemia   . Anxiety   . HSV-2 (herpes simplex virus 2) infection   . Hypertension   . PCOS (polycystic ovarian syndrome)   . Prediabetes   . Vaginal Pap smear, abnormal   . Vitamin D deficiency     Past Surgical History:  Procedure Laterality Date  . CESAREAN SECTION    . CESAREAN SECTION N/A 07/15/2017   Procedure: CESAREAN SECTION;  Surgeon: Jonnie Kind, MD;  Location: [place];  Service: Obstetrics;  Laterality: N/A;  . CHOLECYSTECTOMY      Family History  Problem Relation Age of Onset  . Diabetes Father   . Heart disease Father   . Alcoholism Father   . Drug abuse Father   . Obesity Father   . Colon cancer Maternal Grandmother   . Diabetes Maternal Grandmother   . Deep vein thrombosis Maternal Grandmother   . Pulmonary embolism Maternal Grandmother   . Hypertension Mother   . Sleep apnea Mother   . Obesity Mother   . Esophageal cancer Neg Hx   . Rectal cancer Neg Hx   . Stomach cancer Neg Hx     Social History   Socioeconomic History  . Marital status: Divorced    Spouse name: Not on file  . Number of children: 2  . Years of education: Not on file  . Highest education level: Not on file  Occupational History  . Occupation: Medical office/  clerical  Tobacco Use  . Smoking status: Never Smoker  . Smokeless tobacco: Never Used  Vaping Use  . Vaping Use: Never used  Substance and Sexual Activity  . Alcohol use: Yes    Comment: ocaasionally will have wine   . Drug use: No  . Sexual activity: Yes    Birth control/protection: None  Other Topics Concern  . Not on file  Social History Narrative  . Not on file   Social Determinants of Health   Financial Resource Strain:   . Difficulty of Paying Living Expenses: Not on file  Food Insecurity:   . Worried About Charity fundraiser in the Last Year: Not on file  . Ran Out of Food in the Last Year: Not on file  Transportation Needs:   . Lack of Transportation (Medical): Not on file  . Lack of Transportation (Non-Medical): Not on file  Physical Activity:   . Days of Exercise per Week: Not on file  . Minutes of Exercise per Session: Not on file  Stress:   . Feeling of Stress : Not on file  Social Connections:   . Frequency of Communication with Friends and Family: Not on file  . Frequency of Social Gatherings with Friends and Family: Not on file  . Attends Religious Services: Not on file  .  Active Member of Clubs or Organizations: Not on file  . Attends Archivist Meetings: Not on file  . Marital Status: Not on file  Intimate Partner Violence:   . Fear of Current or Ex-Partner: Not on file  . Emotionally Abused: Not on file  . Physically Abused: Not on file  . Sexually Abused: Not on file    Outpatient Medications Prior to Visit  Medication Sig Dispense Refill  . amLODipine (NORVASC) 5 MG tablet Take 1 tablet (5 mg total) by mouth daily. 90 tablet 2  . norethindrone (MICRONOR) 0.35 MG tablet Take 1 tablet (0.35 mg total) by mouth daily. 84 tablet 3  . Vitamin D, Ergocalciferol, (DRISDOL) 1.25 MG (50000 UNIT) CAPS capsule Take 1 capsule (50,000 Units total) by mouth every 7 (seven) days. (Patient not taking: Reported on 05/02/2020) 4 capsule 1   No  facility-administered medications prior to visit.    No Known Allergies  ROS Review of Systems  Constitutional: Negative.   HENT: Negative.   Eyes: Negative for photophobia and visual disturbance.  Respiratory: Negative.   Cardiovascular: Negative.   Gastrointestinal: Negative.   Endocrine: Negative for polyphagia and polyuria.  Genitourinary: Negative.   Musculoskeletal: Negative for gait problem and joint swelling.  Skin: Negative for pallor and rash.  Allergic/Immunologic: Negative for immunocompromised state.  Hematological: Does not bruise/bleed easily.  Psychiatric/Behavioral: Negative.       Objective:    Physical Exam Vitals and nursing note reviewed.  Constitutional:      General: She is not in acute distress.    Appearance: Normal appearance. She is obese. She is not ill-appearing, toxic-appearing or diaphoretic.  HENT:     Head: Normocephalic and atraumatic.     Right Ear: Tympanic membrane, ear canal and external ear normal.     Left Ear: Tympanic membrane, ear canal and external ear normal.     Mouth/Throat:     Mouth: Mucous membranes are moist.     Pharynx: Oropharynx is clear. No oropharyngeal exudate or posterior oropharyngeal erythema.  Eyes:     General: No scleral icterus.       Right eye: No discharge.        Left eye: No discharge.     Extraocular Movements: Extraocular movements intact.     Conjunctiva/sclera: Conjunctivae normal.     Pupils: Pupils are equal, round, and reactive to light.  Cardiovascular:     Rate and Rhythm: Normal rate and regular rhythm.  Pulmonary:     Effort: Pulmonary effort is normal.     Breath sounds: Normal breath sounds.  Abdominal:     General: Bowel sounds are normal.  Musculoskeletal:     Cervical back: No rigidity or tenderness.     Right lower leg: No edema.     Left lower leg: No edema.  Lymphadenopathy:     Cervical: No cervical adenopathy.  Skin:    General: Skin is warm and dry.  Neurological:      Mental Status: She is alert and oriented to person, place, and time.  Psychiatric:        Mood and Affect: Mood normal.        Behavior: Behavior normal.     BP 126/78   Pulse 78   Temp 98.7 F (37.1 C) (Tympanic)   Ht 5\' 5"  (1.651 m)   Wt (!) 311 lb 6.4 oz (141.3 kg)   SpO2 95%   BMI 51.82 kg/m  Wt Readings from Last 3 Encounters:  05/02/20 (!) 311 lb 6.4 oz (141.3 kg)  02/09/20 (!) 308 lb (139.7 kg)  11/28/19 (!) 304 lb 1.6 oz (137.9 kg)     Health Maintenance Due  Topic Date Due  . INFLUENZA VACCINE  02/26/2020    There are no preventive care reminders to display for this patient.  Lab Results  Component Value Date   TSH 1.350 05/17/2019   Lab Results  Component Value Date   WBC 8.8 10/31/2019   HGB 12.1 10/31/2019   HCT 37.9 10/31/2019   MCV 83.5 10/31/2019   PLT 399.0 10/31/2019   Lab Results  Component Value Date   NA 140 10/31/2019   K 3.8 10/31/2019   CO2 30 10/31/2019   GLUCOSE 87 10/31/2019   BUN 21 10/31/2019   CREATININE 0.92 10/31/2019   BILITOT 0.3 10/31/2019   ALKPHOS 84 10/31/2019   AST 17 10/31/2019   ALT 10 10/31/2019   PROT 6.8 10/31/2019   ALBUMIN 3.5 10/31/2019   CALCIUM 8.8 10/31/2019   ANIONGAP 6 05/11/2019   GFR 83.77 10/31/2019   Lab Results  Component Value Date   CHOL 145 05/02/2019   Lab Results  Component Value Date   HDL 34.00 (L) 05/02/2019   Lab Results  Component Value Date   LDLCALC 102 (H) 05/02/2019   Lab Results  Component Value Date   TRIG 42.0 05/02/2019   Lab Results  Component Value Date   CHOLHDL 4 05/02/2019   Lab Results  Component Value Date   HGBA1C 6.0 10/31/2019      Assessment & Plan:   Problem List Items Addressed This Visit      Cardiovascular and Mediastinum   Essential hypertension   Relevant Orders   Comprehensive metabolic panel   CBC   Urinalysis, Routine w reflex microscopic     Other   Healthcare maintenance   Relevant Orders   Lipid panel   Class 3 severe  obesity with serious comorbidity and body mass index (BMI) of 45.0 to 49.9 in adult [facility])   Vitamin D deficiency - Primary   Relevant Orders   VITAMIN D 25 Hydroxy (Vit-D Deficiency, Fractures)   Snores   Relevant Orders   Ambulatory referral to Sleep Studies   At risk for diabetes mellitus   Relevant Orders   Hemoglobin A1c      No orders of the defined types were placed in this encounter.   Follow-up: Return in about 1 year (around 05/02/2021), or if symptoms worsen or fail to improve.   Information given on health maintenance and disease prevention.  Agrees to consider a sleep study or an provement of quality of sleep.  We discussed using weight watchers.  Doing well with the Norvasc without issue.  Blood pressures well controlled follow-up pain 1 year. Libby Maw, MD

## 2020-05-02 NOTE — Patient Instructions (Signed)
Health Maintenance, Female Adopting a healthy lifestyle and getting preventive care are important in promoting health and wellness. Ask your health care provider about:  The right schedule for you to have regular tests and exams.  Things you can do on your own to prevent diseases and keep yourself healthy. What should I know about diet, weight, and exercise? Eat a healthy diet   Eat a diet that includes plenty of vegetables, fruits, low-fat dairy products, and lean protein.  Do not eat a lot of foods that are high in solid fats, added sugars, or sodium. Maintain a healthy weight Body mass index (BMI) is used to identify weight problems. It estimates body fat based on height and weight. Your health care provider can help determine your BMI and help you achieve or maintain a healthy weight. Get regular exercise Get regular exercise. This is one of the most important things you can do for your health. Most adults should:  Exercise for at least 150 minutes each week. The exercise should increase your heart rate and make you sweat (moderate-intensity exercise).  Do strengthening exercises at least twice a week. This is in addition to the moderate-intensity exercise.  Spend less time sitting. Even light physical activity can be beneficial. Watch cholesterol and blood lipids Have your blood tested for lipids and cholesterol at 36 years of age, then have this test every 5 years. Have your cholesterol levels checked more often if:  Your lipid or cholesterol levels are high.  You are older than 36 years of age.  You are at high risk for heart disease. What should I know about cancer screening? Depending on your health history and family history, you may need to have cancer screening at various ages. This may include screening for:  Breast cancer.  Cervical cancer.  Colorectal cancer.  Skin cancer.  Lung cancer. What should I know about heart disease, diabetes, and high blood  pressure? Blood pressure and heart disease  High blood pressure causes heart disease and increases the risk of stroke. This is more likely to develop in people who have high blood pressure readings, are of African descent, or are overweight.  Have your blood pressure checked: ? Every 3-5 years if you are 54-62 years of age. ? Every year if you are 93 years old or older. Diabetes Have regular diabetes screenings. This checks your fasting blood sugar level. Have the screening done:  Once every three years after age 69 if you are at a normal weight and have a low risk for diabetes.  More often and at a younger age if you are overweight or have a high risk for diabetes. What should I know about preventing infection? Hepatitis B If you have a higher risk for hepatitis B, you should be screened for this virus. Talk with your health care provider to find out if you are at risk for hepatitis B infection. Hepatitis C Testing is recommended for:  Everyone born from [birth year] through 1967.  Anyone with known risk factors for hepatitis C. Sexually transmitted infections (STIs)  Get screened for STIs, including gonorrhea and chlamydia, if: ? You are sexually active and are younger than 36 years of age. ? You are older than 36 years of age and your health care provider tells you that you are at risk for this type of infection. ? Your sexual activity has changed since you were last screened, and you are at increased risk for chlamydia or gonorrhea. Ask your health care provider if  you are at risk.  Ask your health care provider about whether you are at high risk for HIV. Your health care provider may recommend a prescription medicine to help prevent HIV infection. If you choose to take medicine to prevent HIV, you should first get tested for HIV. You should then be tested every 3 months for as long as you are taking the medicine. Pregnancy  If you are about to stop having your period (premenopausal) and  you may become pregnant, seek counseling before you get pregnant.  Take 400 to 800 micrograms (mcg) of folic acid every day if you become pregnant.  Ask for birth control (contraception) if you want to prevent pregnancy. Osteoporosis and menopause Osteoporosis is a disease in which the bones lose minerals and strength with aging. This can result in bone fractures. If you are 9 years old or older, or if you are at risk for osteoporosis and fractures, ask your health care provider if you should:  Be screened for bone loss.  Take a calcium or vitamin D supplement to lower your risk of fractures.  Be given hormone replacement therapy (HRT) to treat symptoms of menopause. Follow these instructions at home: Lifestyle  Do not use any products that contain nicotine or tobacco, such as cigarettes, e-cigarettes, and chewing tobacco. If you need help quitting, ask your health care provider.  Do not use street drugs.  Do not share needles.  Ask your health care provider for help if you need support or information about quitting drugs. Alcohol use  Do not drink alcohol if: ? Your health care provider tells you not to drink. ? You are pregnant, may be pregnant, or are planning to become pregnant.  If you drink alcohol: ? Limit how much you use to 0-1 drink a day. ? Limit intake if you are breastfeeding.  Be aware of how much alcohol is in your drink. In the [place] one drink equals one 12 oz bottle of beer (355 mL), one 5 oz glass of wine (148 mL), or one 1 oz glass of hard liquor (44 mL). General instructions  Schedule regular health, dental, and eye exams.  Stay current with your vaccines.  Tell your health care provider if: ? You often feel depressed. ? You have ever been abused or do not feel safe at home. Summary  Adopting a healthy lifestyle and getting preventive care are important in promoting health and wellness.  Follow your health care provider's instructions about healthy  diet, exercising, and getting tested or screened for diseases.  Follow your health care provider's instructions on monitoring your cholesterol and blood pressure. This information is not intended to replace advice given to you by your health care provider. Make sure you discuss any questions you have with your health care provider. Document Revised: 07/07/2018 Document Reviewed: 07/07/2018 Elsevier Patient Education  2020 [place] 93-58 Years Old, Female Preventive care refers to visits with your health care provider and lifestyle choices that can promote health and wellness. This includes:  A yearly physical exam. This may also be called an annual well check.  Regular dental visits and eye exams.  Immunizations.  Screening for certain conditions.  Healthy lifestyle choices, such as eating a healthy diet, getting regular exercise, not using drugs or products that contain nicotine and tobacco, and limiting alcohol use. What can I expect for my preventive care visit? Physical exam Your health care provider will check your:  Height and weight. This may be used  to calculate body mass index (BMI), which tells if you are at a healthy weight.  Heart rate and blood pressure.  Skin for abnormal spots. Counseling Your health care provider may ask you questions about your:  Alcohol, tobacco, and drug use.  Emotional well-being.  Home and relationship well-being.  Sexual activity.  Eating habits.  Work and work Statistician.  Method of birth control.  Menstrual cycle.  Pregnancy history. What immunizations do I need?  Influenza (flu) vaccine  This is recommended every year. Tetanus, diphtheria, and pertussis (Tdap) vaccine  You may need a Td booster every 10 years. Varicella (chickenpox) vaccine  You may need this if you have not been vaccinated. Human papillomavirus (HPV) vaccine  If recommended by your health care provider, you may need three  doses over 6 months. Measles, mumps, and rubella (MMR) vaccine  You may need at least one dose of MMR. You may also need a second dose. Meningococcal conjugate (MenACWY) vaccine  One dose is recommended if you are age 46-21 years and a first-year college student living in a residence hall, or if you have one of several medical conditions. You may also need additional booster doses. Pneumococcal conjugate (PCV13) vaccine  You may need this if you have certain conditions and were not previously vaccinated. Pneumococcal polysaccharide (PPSV23) vaccine  You may need one or two doses if you smoke cigarettes or if you have certain conditions. Hepatitis A vaccine  You may need this if you have certain conditions or if you travel or work in places where you may be exposed to hepatitis A. Hepatitis B vaccine  You may need this if you have certain conditions or if you travel or work in places where you may be exposed to hepatitis B. Haemophilus influenzae type b (Hib) vaccine  You may need this if you have certain conditions. You may receive vaccines as individual doses or as more than one vaccine together in one shot (combination vaccines). Talk with your health care provider about the risks and benefits of combination vaccines. What tests do I need?  Blood tests  Lipid and cholesterol levels. These may be checked every 5 years starting at age 12.  Hepatitis C test.  Hepatitis B test. Screening  Diabetes screening. This is done by checking your blood sugar (glucose) after you have not eaten for a while (fasting).  Sexually transmitted disease (STD) testing.  BRCA-related cancer screening. This may be done if you have a family history of breast, ovarian, tubal, or peritoneal cancers.  Pelvic exam and Pap test. This may be done every 3 years starting at age 28. Starting at age 37, this may be done every 5 years if you have a Pap test in combination with an HPV test. Talk with your  health care provider about your test results, treatment options, and if necessary, the need for more tests. Follow these instructions at home: Eating and drinking   Eat a diet that includes fresh fruits and vegetables, whole grains, lean protein, and low-fat dairy.  Take vitamin and mineral supplements as recommended by your health care provider.  Do not drink alcohol if: ? Your health care provider tells you not to drink. ? You are pregnant, may be pregnant, or are planning to become pregnant.  If you drink alcohol: ? Limit how much you have to 0-1 drink a day. ? Be aware of how much alcohol is in your drink. In the [place] one drink equals one 12 oz bottle of beer (  355 mL), one 5 oz glass of wine (148 mL), or one 1 oz glass of hard liquor (44 mL). Lifestyle  Take daily care of your teeth and gums.  Stay active. Exercise for at least 30 minutes on 5 or more days each week.  Do not use any products that contain nicotine or tobacco, such as cigarettes, e-cigarettes, and chewing tobacco. If you need help quitting, ask your health care provider.  If you are sexually active, practice safe sex. Use a condom or other form of birth control (contraception) in order to prevent pregnancy and STIs (sexually transmitted infections). If you plan to become pregnant, see your health care provider for a preconception visit. What's next?  Visit your health care provider once a year for a well check visit.  Ask your health care provider how often you should have your eyes and teeth checked.  Stay up to date on all vaccines. This information is not intended to replace advice given to you by your health care provider. Make sure you discuss any questions you have with your health care provider. Document Revised: 03/25/2018 Document Reviewed: 03/25/2018 Elsevier Patient Education  [place].  Sleep Apnea Sleep apnea is a condition in which breathing pauses or becomes shallow during sleep.  Episodes of sleep apnea usually last 10 seconds or longer, and they may occur as many as 20 times an hour. Sleep apnea disrupts your sleep and keeps your body from getting the rest that it needs. This condition can increase your risk of certain health problems, including:  Heart attack.  Stroke.  Obesity.  Diabetes.  Heart failure.  Irregular heartbeat. What are the causes? There are three kinds of sleep apnea:  Obstructive sleep apnea. This kind is caused by a blocked or collapsed airway.  Central sleep apnea. This kind happens when the part of the brain that controls breathing does not send the correct signals to the muscles that control breathing.  Mixed sleep apnea. This is a combination of obstructive and central sleep apnea. The most common cause of this condition is a collapsed or blocked airway. An airway can collapse or become blocked if:  Your throat muscles are abnormally relaxed.  Your tongue and tonsils are larger than normal.  You are overweight.  Your airway is smaller than normal. What increases the risk? You are more likely to develop this condition if you:  Are overweight.  Smoke.  Have a smaller than normal airway.  Are elderly.  Are female.  Drink alcohol.  Take sedatives or tranquilizers.  Have a family history of sleep apnea. What are the signs or symptoms? Symptoms of this condition include:  Trouble staying asleep.  Daytime sleepiness and tiredness.  Irritability.  Loud snoring.  Morning headaches.  Trouble concentrating.  Forgetfulness.  Decreased interest in sex.  Unexplained sleepiness.  Mood swings.  Personality changes.  Feelings of depression.  Waking up often during the night to urinate.  Dry mouth.  Sore throat. How is this diagnosed? This condition may be diagnosed with:  A medical history.  A physical exam.  A series of tests that are done while you are sleeping (sleep study). These tests are  usually done in a sleep lab, but they may also be done at home. How is this treated? Treatment for this condition aims to restore normal breathing and to ease symptoms during sleep. It may involve managing health issues that can affect breathing, such as high blood pressure or obesity. Treatment may include:  Sleeping on your side.  Using a decongestant if you have nasal congestion.  Avoiding the use of depressants, including alcohol, sedatives, and narcotics.  Losing weight if you are overweight.  Making changes to your diet.  Quitting smoking.  Using a device to open your airway while you sleep, such as: ? An oral appliance. This is a custom-made mouthpiece that shifts your lower jaw forward. ? A continuous positive airway pressure (CPAP) device. This device blows air through a mask when you breathe out (exhale). ? A nasal expiratory positive airway pressure (EPAP) device. This device has valves that you put into each nostril. ? A bi-level positive airway pressure (BPAP) device. This device blows air through a mask when you breathe in (inhale) and breathe out (exhale).  Having surgery if other treatments do not work. During surgery, excess tissue is removed to create a wider airway. It is important to get treatment for sleep apnea. Without treatment, this condition can lead to:  High blood pressure.  Coronary artery disease.  In men, an inability to achieve or maintain an erection (impotence).  Reduced thinking abilities. Follow these instructions at home: Lifestyle  Make any lifestyle changes that your health care provider recommends.  Eat a healthy, well-balanced diet.  Take steps to lose weight if you are overweight.  Avoid using depressants, including alcohol, sedatives, and narcotics.  Do not use any products that contain nicotine or tobacco, such as cigarettes, e-cigarettes, and chewing tobacco. If you need help quitting, ask your health care provider. General  instructions  Take over-the-counter and prescription medicines only as told by your health care provider.  If you were given a device to open your airway while you sleep, use it only as told by your health care provider.  If you are having surgery, make sure to tell your health care provider you have sleep apnea. You may need to bring your device with you.  Keep all follow-up visits as told by your health care provider. This is important. Contact a health care provider if:  The device that you received to open your airway during sleep is uncomfortable or does not seem to be working.  Your symptoms do not improve.  Your symptoms get worse. Get help right away if:  You develop: ? Chest pain. ? Shortness of breath. ? Discomfort in your back, arms, or stomach.  You have: ? Trouble speaking. ? Weakness on one side of your body. ? Drooping in your face. These symptoms may represent a serious problem that is an emergency. Do not wait to see if the symptoms will go away. Get medical help right away. Call your local emergency services (911 in the [place] Do not drive yourself to the hospital. Summary  Sleep apnea is a condition in which breathing pauses or becomes shallow during sleep.  The most common cause is a collapsed or blocked airway.  The goal of treatment is to restore normal breathing and to ease symptoms during sleep. This information is not intended to replace advice given to you by your health care provider. Make sure you discuss any questions you have with your health care provider. Document Revised: 12/29/2018 Document Reviewed: 03/09/2018 Elsevier Patient Education  [place].

## 2020-05-18 ENCOUNTER — Other Ambulatory Visit (INDEPENDENT_AMBULATORY_CARE_PROVIDER_SITE_OTHER): Payer: No Typology Code available for payment source

## 2020-05-18 DIAGNOSIS — I10 Essential (primary) hypertension: Secondary | ICD-10-CM

## 2020-05-18 DIAGNOSIS — E559 Vitamin D deficiency, unspecified: Secondary | ICD-10-CM | POA: Diagnosis not present

## 2020-05-18 DIAGNOSIS — Z0000 Encounter for general adult medical examination without abnormal findings: Secondary | ICD-10-CM

## 2020-05-18 DIAGNOSIS — Z9189 Other specified personal risk factors, not elsewhere classified: Secondary | ICD-10-CM

## 2020-05-18 LAB — URINALYSIS, ROUTINE W REFLEX MICROSCOPIC
Hgb urine dipstick: NEGATIVE
Leukocytes,Ua: NEGATIVE
Nitrite: NEGATIVE
RBC / HPF: NONE SEEN (ref 0–?)
Specific Gravity, Urine: 1.025 (ref 1.000–1.030)
Total Protein, Urine: NEGATIVE
Urine Glucose: NEGATIVE
Urobilinogen, UA: 1 (ref 0.0–1.0)
pH: 6.5 (ref 5.0–8.0)

## 2020-05-18 LAB — COMPREHENSIVE METABOLIC PANEL
ALT: 10 U/L (ref 0–35)
AST: 15 U/L (ref 0–37)
Albumin: 3.8 g/dL (ref 3.5–5.2)
Alkaline Phosphatase: 92 U/L (ref 39–117)
BUN: 13 mg/dL (ref 6–23)
CO2: 25 mEq/L (ref 19–32)
Calcium: 8.9 mg/dL (ref 8.4–10.5)
Chloride: 104 mEq/L (ref 96–112)
Creatinine, Ser: 0.8 mg/dL (ref 0.40–1.20)
GFR: 94.97 mL/min (ref >60.00)
Glucose, Bld: 90 mg/dL (ref 70–99)
Potassium: 3.7 mEq/L (ref 3.5–5.1)
Sodium: 138 mEq/L (ref 135–145)
Total Bilirubin: 0.5 mg/dL (ref 0.2–1.2)
Total Protein: 7.1 g/dL (ref 6.0–8.3)

## 2020-05-18 LAB — CBC
HCT: 37.7 % (ref 36.0–46.0)
Hemoglobin: 12.2 g/dL (ref 12.0–15.0)
MCHC: 32.4 g/dL (ref 30.0–36.0)
MCV: 80.7 fl (ref 78.0–100.0)
Platelets: 387 10*3/uL (ref 150.0–400.0)
RBC: 4.67 Mil/uL (ref 3.87–5.11)
RDW: 14.3 % (ref 11.5–15.5)
WBC: 12.5 10*3/uL — ABNORMAL HIGH (ref 4.0–10.5)

## 2020-05-18 LAB — LIPID PANEL
Cholesterol: 155 mg/dL (ref 0–200)
HDL: 39.5 mg/dL (ref >39.00)
LDL Cholesterol: 106 mg/dL — ABNORMAL HIGH (ref 0–99)
NonHDL: 115.5
Total CHOL/HDL Ratio: 4
Triglycerides: 48 mg/dL (ref 0.0–149.0)
VLDL: 9.6 mg/dL (ref 0.0–40.0)

## 2020-05-18 LAB — HEMOGLOBIN A1C: Hgb A1c MFr Bld: 6.3 % (ref 4.6–6.5)

## 2020-05-18 LAB — VITAMIN D 25 HYDROXY (VIT D DEFICIENCY, FRACTURES): VITD: 13.74 ng/mL — ABNORMAL LOW (ref 30.00–100.00)

## 2020-05-21 ENCOUNTER — Other Ambulatory Visit: Payer: Self-pay

## 2020-05-21 DIAGNOSIS — E559 Vitamin D deficiency, unspecified: Secondary | ICD-10-CM

## 2020-05-21 MED ORDER — VITAMIN D (ERGOCALCIFEROL) 1.25 MG (50000 UNIT) PO CAPS: 50000.0000 [IU] | ORAL_CAPSULE | ORAL | 5 refills | Status: DC

## 2020-06-20 ENCOUNTER — Ambulatory Visit: Payer: No Typology Code available for payment source

## 2020-06-20 ENCOUNTER — Other Ambulatory Visit (HOSPITAL_COMMUNITY)
Admission: RE | Admit: 2020-06-20 | Discharge: 2020-06-20 | Disposition: A | Discharge status: Home / Self Care | Payer: No Typology Code available for payment source | Source: Ambulatory Visit | Attending: Family Medicine | Admitting: Family Medicine

## 2020-06-20 ENCOUNTER — Other Ambulatory Visit: Payer: Self-pay

## 2020-06-20 VITALS — BP 125/83 | HR 69

## 2020-06-20 DIAGNOSIS — Z113 Encounter for screening for infections with a predominantly sexual mode of transmission: Secondary | ICD-10-CM

## 2020-06-20 NOTE — Progress Notes (Signed)
Patient presents for STD screening because she has a new partner. Patient doesnot have any known exposure. Kathrene Alu RN

## 2020-06-20 NOTE — Progress Notes (Signed)
Patient seen and assessed by nursing staff.  Agree with documentation and plan.  

## 2020-06-21 LAB — HEPATITIS B SURFACE ANTIGEN: Hepatitis B Surface Ag: NEGATIVE

## 2020-06-21 LAB — HEPATITIS C ANTIBODY: Hep C Virus Ab: <0.1 s/co ratio (ref 0.0–0.9)

## 2020-06-21 LAB — HIV ANTIBODY (ROUTINE TESTING W REFLEX): HIV Screen 4th Generation wRfx: NONREACTIVE

## 2020-06-21 LAB — RPR: RPR Ser Ql: NONREACTIVE

## 2020-06-22 LAB — CERVICOVAGINAL ANCILLARY ONLY
Bacterial Vaginitis (gardnerella): NEGATIVE
Candida Glabrata: NEGATIVE
Candida Vaginitis: NEGATIVE
Chlamydia: NEGATIVE
Comment: NEGATIVE
Comment: NEGATIVE
Comment: NEGATIVE
Comment: NEGATIVE
Comment: NEGATIVE
Comment: NORMAL
Neisseria Gonorrhea: NEGATIVE
Trichomonas: NEGATIVE

## 2020-09-22 ENCOUNTER — Other Ambulatory Visit: Payer: Self-pay | Admitting: Family Medicine

## 2020-09-22 DIAGNOSIS — I10 Essential (primary) hypertension: Secondary | ICD-10-CM

## 2020-12-01 ENCOUNTER — Other Ambulatory Visit: Payer: Self-pay | Admitting: Family

## 2020-12-01 DIAGNOSIS — I10 Essential (primary) hypertension: Secondary | ICD-10-CM

## 2021-01-29 ENCOUNTER — Telehealth: Payer: Self-pay

## 2021-01-29 DIAGNOSIS — I10 Essential (primary) hypertension: Secondary | ICD-10-CM

## 2021-01-29 MED ORDER — AMLODIPINE BESYLATE 5 MG PO TABS: 5.0000 mg | ORAL_TABLET | Freq: Every day | ORAL | 0 refills | Status: DC

## 2021-01-29 NOTE — Telephone Encounter (Signed)
Pt has requested refills thru her pharmacy 3 times for Amlodipine.  Does she need an ov for refills?  Her pharmacy is [facility], [place], [place].    Thanks!

## 2021-01-29 NOTE — Telephone Encounter (Signed)
Refill sent in

## 2021-03-13 ENCOUNTER — Other Ambulatory Visit: Payer: Self-pay | Admitting: Family Medicine

## 2021-03-13 DIAGNOSIS — Z3041 Encounter for surveillance of contraceptive pills: Secondary | ICD-10-CM

## 2021-03-22 ENCOUNTER — Other Ambulatory Visit (HOSPITAL_COMMUNITY)
Admission: RE | Admit: 2021-03-22 | Discharge: 2021-03-22 | Disposition: A | Discharge status: Home / Self Care | Payer: No Typology Code available for payment source | Source: Ambulatory Visit | Attending: Family Medicine | Admitting: Family Medicine

## 2021-03-22 ENCOUNTER — Ambulatory Visit (INDEPENDENT_AMBULATORY_CARE_PROVIDER_SITE_OTHER): Payer: No Typology Code available for payment source | Admitting: Family Medicine

## 2021-03-22 ENCOUNTER — Other Ambulatory Visit: Payer: Self-pay

## 2021-03-22 VITALS — BP 136/93 | HR 82 | Ht 65.0 in | Wt 317.0 lb

## 2021-03-22 DIAGNOSIS — Z3009 Encounter for other general counseling and advice on contraception: Secondary | ICD-10-CM | POA: Diagnosis not present

## 2021-03-22 DIAGNOSIS — Z7721 Contact with and (suspected) exposure to potentially hazardous body fluids: Secondary | ICD-10-CM

## 2021-03-22 DIAGNOSIS — N946 Dysmenorrhea, unspecified: Secondary | ICD-10-CM | POA: Diagnosis not present

## 2021-03-22 DIAGNOSIS — Z01419 Encounter for gynecological examination (general) (routine) without abnormal findings: Secondary | ICD-10-CM

## 2021-03-22 NOTE — Progress Notes (Signed)
GYNECOLOGY ANNUAL PREVENTATIVE CARE ENCOUNTER NOTE  Subjective:   Courtney Mooney is a 35 y.o. [phone] female here for a routine annual gynecologic exam.  Current complaints: would like BTL. Has dysmenorrhea around time of period. Is on POP. Some breakthrough bleeding with POPs.  Desires STD testing. Denies abnormal vaginal bleeding, discharge, pelvic pain, problems with intercourse or other gynecologic concerns.    Gynecologic History Patient's last menstrual period was 03/06/2021. Patient is sexually active  Contraception: oral progesterone-only contraceptive Last Pap:  Lab Results  Component Value Date   DIAGPAP  02/09/2020    - Negative for intraepithelial lesion or malignancy (NILM)   DIAGPAP  01/20/2019    NEGATIVE FOR INTRAEPITHELIAL LESIONS OR MALIGNANCY.   DIAGPAP TRICHOMONAS VAGINALIS PRESENT. 01/20/2019   HPV NOT DETECTED 12/01/2016   HPVHIGH Negative 02/09/2020    Last mammogram: n/a.  Colorectal Cancer Screening: 2021. Results were: normal. Next due: 5-10 years  Obstetric History OB History  Gravida Para Term Preterm AB Living  '3 2 2   1 2  '$ SAB IAB Ectopic Multiple Live Births  1     0 2    # Outcome Date GA Lbr Len/2nd Weight Sex Delivery Anes PTL Lv  3 Term 07/15/17 [redacted]w[redacted]d 7 lb 4.9 oz (3.315 kg) F CS-LTranv Spinal  LIV  2 Term      CS-LTranv Spinal  LIV  1 SAB             Past Medical History:  Diagnosis Date   Anemia    Anxiety    HSV-2 (herpes simplex virus 2) infection    Hypertension    PCOS (polycystic ovarian syndrome)    Prediabetes    Vaginal Pap smear, abnormal    Vitamin D deficiency     Past Surgical History:  Procedure Laterality Date   CESAREAN SECTION     CESAREAN SECTION N/A 07/15/2017   Procedure: CESAREAN SECTION;  Surgeon: FJonnie Kind MD;  [place]  Service: Obstetrics;  Laterality: N/A;   CHOLECYSTECTOMY      Current Outpatient Medications on File Prior to Visit  Medication Sig Dispense  Refill   amLODipine (NORVASC) 5 MG tablet Take 1 tablet (5 mg total) by mouth daily. 90 tablet 0   cetirizine (ZYRTEC) 10 MG tablet Take 10 mg by mouth daily.     Multiple Vitamin (MULTIVITAMIN) capsule Take 1 capsule by mouth daily.     norethindrone (MICRONOR) 0.35 MG tablet Take 1 tablet by mouth once daily 84 tablet 3   Vitamin D, Ergocalciferol, (DRISDOL) 1.25 MG (50000 UNIT) CAPS capsule Take 1 capsule (50,000 Units total) by mouth every 7 (seven) days. 4 capsule 5   No current facility-administered medications on file prior to visit.    No Known Allergies  Social History   Socioeconomic History   Marital status: Divorced    Spouse name: Not on file   Number of children: 2   Years of education: Not on file   Highest education level: Not on file  Occupational History   Occupation: Medical office/ clerical  Tobacco Use   Smoking status: Never   Smokeless tobacco: Never  Vaping Use   Vaping Use: Never used  Substance and Sexual Activity   Alcohol use: Yes    Comment: ocaasionally will have wine    Drug use: No   Sexual activity: Yes    Birth control/protection: None  Other Topics Concern   Not on file  Social History  Narrative   Not on file   Social Determinants of Health   Financial Resource Strain: Not on file  Food Insecurity: Not on file  Transportation Needs: Not on file  Physical Activity: Not on file  Stress: Not on file  Social Connections: Not on file  Intimate Partner Violence: Not on file    Family History  Problem Relation Age of Onset   Diabetes Father    Heart disease Father    Alcoholism Father    Drug abuse Father    Obesity Father    Colon cancer Maternal Grandmother    Diabetes Maternal Grandmother    Deep vein thrombosis Maternal Grandmother    Pulmonary embolism Maternal Grandmother    Hypertension Mother    Sleep apnea Mother    Obesity Mother    Esophageal cancer Neg Hx    Rectal cancer Neg Hx    Stomach cancer Neg Hx      The following portions of the patient's history were reviewed and updated as appropriate: allergies, current medications, past family history, past medical history, past social history, past surgical history and problem list.  Review of Systems Pertinent items are noted in HPI.   Objective:  BP (!) 136/93   Pulse 82   Ht '5\' 5"'$  (1.651 m)   Wt (!) 317 lb (143.8 kg)   LMP 03/06/2021   BMI 52.75 kg/m  Wt Readings from Last 3 Encounters:  03/22/21 (!) 317 lb (143.8 kg)  05/02/20 (!) 311 lb 6.4 oz (141.3 kg)  02/09/20 (!) 308 lb (139.7 kg)     Chaperone present during exam  CONSTITUTIONAL: Well-developed, well-nourished female in no acute distress.  HENT:  Normocephalic, atraumatic, External right and left ear normal. Oropharynx is clear and moist EYES: Conjunctivae and EOM are normal. Pupils are equal, round, and reactive to light. No scleral icterus.  NECK: Normal range of motion, supple, no masses.  Normal thyroid.   CARDIOVASCULAR: Normal heart rate noted, regular rhythm RESPIRATORY: Clear to auscultation bilaterally. Effort and breath sounds normal, no problems with respiration noted. BREASTS: Symmetric in size. No masses, skin changes, nipple drainage, or lymphadenopathy. ABDOMEN: Soft, normal bowel sounds, no distention noted.  No tenderness, rebound or guarding.  PELVIC: Normal appearing external genitalia; normal appearing vaginal mucosa and cervix.  No abnormal discharge noted.   MUSCULOSKELETAL: Normal range of motion. No tenderness.  No cyanosis, clubbing, or edema.  2+ distal pulses. SKIN: Skin is warm and dry. No rash noted. Not diaphoretic. No erythema. No pallor. NEUROLOGIC: Alert and oriented to person, place, and time. Normal reflexes, muscle tone coordination. No cranial nerve deficit noted. PSYCHIATRIC: Normal mood and affect. Normal behavior. Normal judgment and thought content.  Assessment:  Annual gynecologic examination with pap smear   Plan:  1. Well  Woman Exam Will follow up results of pap smear and manage accordingly. STD testing discussed. Patient requested testing - Cytology - PAP( [facility]) - US PELVIC COMPLETE WITH TRANSVAGINAL; Future  2. Dysmenorrhea Will get [place] to evaluate fibroids. Discussed options - desires BTL and would like to come off POPs.  She is hesitant to remain on hormones. At one point, her last Gyn attempted to place IUD and was unable to due to cervical stenosis. Could place IUD in OR at time of BTL. Also discussed possible hysterectomy, although patient appears less interested in this. Will have patient meet with Dr Ihor Dow after [place].  3. Exposure to body fluid - RPR - HIV antibody (with reflex) -  Hepatitis C Antibody - Hepatitis B Surface AntiGEN   4. Unwanted fertility BTL consult scheduled with Dr Ihor Dow   Routine preventative health maintenance measures emphasized. Please refer to After Visit Summary for other counseling recommendations.    Loma Boston, [place] for [employer]

## 2021-03-25 ENCOUNTER — Ambulatory Visit (HOSPITAL_BASED_OUTPATIENT_CLINIC_OR_DEPARTMENT_OTHER)
Admission: RE | Admit: 2021-03-25 | Discharge: 2021-03-25 | Disposition: A | Discharge status: Home / Self Care | Payer: No Typology Code available for payment source | Source: Ambulatory Visit | Attending: Family Medicine | Admitting: Family Medicine

## 2021-03-25 ENCOUNTER — Other Ambulatory Visit: Payer: Self-pay

## 2021-03-25 DIAGNOSIS — N946 Dysmenorrhea, unspecified: Secondary | ICD-10-CM | POA: Diagnosis present

## 2021-03-25 LAB — CYTOLOGY - PAP
Adequacy: ABSENT
Chlamydia: NEGATIVE
Comment: NEGATIVE
Comment: NEGATIVE
Comment: NORMAL
Diagnosis: NEGATIVE
High risk HPV: NEGATIVE
Neisseria Gonorrhea: NEGATIVE

## 2021-03-27 LAB — HEPATITIS B SURFACE ANTIGEN: Hepatitis B Surface Ag: NEGATIVE

## 2021-03-27 LAB — HIV ANTIBODY (ROUTINE TESTING W REFLEX): HIV Screen 4th Generation wRfx: NONREACTIVE

## 2021-03-27 LAB — HEPATITIS C ANTIBODY: Hep C Virus Ab: <0.1 s/co ratio (ref 0.0–0.9)

## 2021-03-27 LAB — RPR: RPR Ser Ql: NONREACTIVE

## 2021-04-15 ENCOUNTER — Ambulatory Visit: Payer: No Typology Code available for payment source | Admitting: Obstetrics & Gynecology

## 2021-05-01 ENCOUNTER — Other Ambulatory Visit: Payer: Self-pay | Admitting: Family Medicine

## 2021-05-01 DIAGNOSIS — I10 Essential (primary) hypertension: Secondary | ICD-10-CM

## 2021-06-24 ENCOUNTER — Encounter: Payer: Self-pay | Admitting: Family Medicine

## 2021-06-24 ENCOUNTER — Ambulatory Visit (INDEPENDENT_AMBULATORY_CARE_PROVIDER_SITE_OTHER): Payer: BC Managed Care – PPO | Admitting: Family Medicine

## 2021-06-24 ENCOUNTER — Other Ambulatory Visit: Payer: Self-pay

## 2021-06-24 VITALS — BP 132/88 | HR 90 | Temp 97.0°F | Ht 65.0 in | Wt 311.4 lb

## 2021-06-24 DIAGNOSIS — Z0000 Encounter for general adult medical examination without abnormal findings: Secondary | ICD-10-CM

## 2021-06-24 DIAGNOSIS — I10 Essential (primary) hypertension: Secondary | ICD-10-CM | POA: Diagnosis not present

## 2021-06-24 DIAGNOSIS — R7303 Prediabetes: Secondary | ICD-10-CM

## 2021-06-24 DIAGNOSIS — E669 Obesity, unspecified: Secondary | ICD-10-CM

## 2021-06-24 DIAGNOSIS — Z6841 Body Mass Index (BMI) 40.0 and over, adult: Secondary | ICD-10-CM

## 2021-06-24 DIAGNOSIS — F4322 Adjustment disorder with anxiety: Secondary | ICD-10-CM

## 2021-06-24 DIAGNOSIS — E559 Vitamin D deficiency, unspecified: Secondary | ICD-10-CM

## 2021-06-24 DIAGNOSIS — Z23 Encounter for immunization: Secondary | ICD-10-CM | POA: Diagnosis not present

## 2021-06-24 LAB — URINALYSIS, ROUTINE W REFLEX MICROSCOPIC
Hgb urine dipstick: NEGATIVE
Ketones, ur: NEGATIVE
Leukocytes,Ua: NEGATIVE
Nitrite: NEGATIVE
Specific Gravity, Urine: 1.025 (ref 1.000–1.030)
Urine Glucose: NEGATIVE
Urobilinogen, UA: 1 (ref 0.0–1.0)
pH: 6 (ref 5.0–8.0)

## 2021-06-24 LAB — LIPID PANEL
Cholesterol: 151 mg/dL (ref 0–200)
HDL: 31.2 mg/dL — ABNORMAL LOW (ref >39.00)
LDL Cholesterol: 108 mg/dL — ABNORMAL HIGH (ref 0–99)
NonHDL: 119.69
Total CHOL/HDL Ratio: 5
Triglycerides: 59 mg/dL (ref 0.0–149.0)
VLDL: 11.8 mg/dL (ref 0.0–40.0)

## 2021-06-24 LAB — COMPREHENSIVE METABOLIC PANEL
ALT: 12 U/L (ref 0–35)
AST: 16 U/L (ref 0–37)
Albumin: 4 g/dL (ref 3.5–5.2)
Alkaline Phosphatase: 91 U/L (ref 39–117)
BUN: 14 mg/dL (ref 6–23)
CO2: 30 mEq/L (ref 19–32)
Calcium: 9.3 mg/dL (ref 8.4–10.5)
Chloride: 104 mEq/L (ref 96–112)
Creatinine, Ser: 0.92 mg/dL (ref 0.40–1.20)
GFR: 79.69 mL/min (ref >60.00)
Glucose, Bld: 96 mg/dL (ref 70–99)
Potassium: 3.8 mEq/L (ref 3.5–5.1)
Sodium: 143 mEq/L (ref 135–145)
Total Bilirubin: 0.3 mg/dL (ref 0.2–1.2)
Total Protein: 7.4 g/dL (ref 6.0–8.3)

## 2021-06-24 LAB — CBC
HCT: 42.4 % (ref 36.0–46.0)
Hemoglobin: 13.3 g/dL (ref 12.0–15.0)
MCHC: 31.5 g/dL (ref 30.0–36.0)
MCV: 82.2 fl (ref 78.0–100.0)
Platelets: 387 10*3/uL (ref 150.0–400.0)
RBC: 5.15 Mil/uL — ABNORMAL HIGH (ref 3.87–5.11)
RDW: 14 % (ref 11.5–15.5)
WBC: 8.4 10*3/uL (ref 4.0–10.5)

## 2021-06-24 LAB — HEMOGLOBIN A1C: Hgb A1c MFr Bld: 6.3 % (ref 4.6–6.5)

## 2021-06-24 LAB — VITAMIN D 25 HYDROXY (VIT D DEFICIENCY, FRACTURES): VITD: 27.87 ng/mL — ABNORMAL LOW (ref 30.00–100.00)

## 2021-06-24 MED ORDER — PAROXETINE HCL 10 MG PO TABS: 10.0000 mg | ORAL_TABLET | Freq: Every day | ORAL | 0 refills | Status: DC

## 2021-06-24 MED ORDER — VITAMIN D (ERGOCALCIFEROL) 1.25 MG (50000 UNIT) PO CAPS: 50000.0000 [IU] | ORAL_CAPSULE | ORAL | 3 refills | Status: DC

## 2021-06-24 NOTE — Progress Notes (Signed)
Established Patient Office Visit  Subjective:  Patient ID: Courtney Mooney, female    DOB: 1985-10-26  Age: 35 y.o. MRN: [id number]  CC:  Chief Complaint  Patient presents with   Annual Exam    CPE, no concerns. Patient fasting.     HPI [place] presents for yearly health check and follow-up of hypertension, vitamin D deficiency, obesity and prediabetes.  Patient has been scheduled for bariatric surgery but then decided to hold off.  She has been seeing weight loss management.  Has not been taking vitamin D.  Continues Norvasc for hypertension.  Her grandmother died yesterday.  She had been sick over this past year.  Things have not been well at home either.  This is a stressful time for her.  She has been daily with anxiety, poor sleep and sadness.  Up-to-date on her health maintenance.  Past Medical History:  Diagnosis Date   Anemia    Anxiety    HSV-2 (herpes simplex virus 2) infection    Hypertension    PCOS (polycystic ovarian syndrome)    Prediabetes    Vaginal Pap smear, abnormal    Vitamin D deficiency     Past Surgical History:  Procedure Laterality Date   CESAREAN SECTION     CESAREAN SECTION N/A 07/15/2017   Procedure: CESAREAN SECTION;  Surgeon: Jonnie Kind, MD;  Location: [facility];  Service: Obstetrics;  Laterality: N/A;   CHOLECYSTECTOMY      Family History  Problem Relation Age of Onset   Hypertension Mother    Sleep apnea Mother    Obesity Mother    Diabetes Father    Heart disease Father    Alcoholism Father    Drug abuse Father    Obesity Father    Colon cancer Maternal Grandmother    Diabetes Maternal Grandmother    Deep vein thrombosis Maternal Grandmother    Pulmonary embolism Maternal Grandmother    Esophageal cancer Neg Hx    Rectal cancer Neg Hx    Stomach cancer Neg Hx     Social History   Socioeconomic History   Marital status: Divorced    Spouse name: Not on file   Number of children: 2   Years of  education: Not on file   Highest education level: Not on file  Occupational History   Occupation: Medical office/ clerical  Tobacco Use   Smoking status: Never   Smokeless tobacco: Never  Vaping Use   Vaping Use: Never used  Substance and Sexual Activity   Alcohol use: Yes    Comment: ocaasionally will have wine    Drug use: No   Sexual activity: Yes    Birth control/protection: Pill  Other Topics Concern   Not on file  Social History Narrative   Not on file   Social Determinants of Health   Financial Resource Strain: Not on file  Food Insecurity: Not on file  Transportation Needs: Not on file  Physical Activity: Not on file  Stress: Not on file  Social Connections: Not on file  Intimate Partner Violence: Not on file    Outpatient Medications Prior to Visit  Medication Sig Dispense Refill   amLODipine (NORVASC) 5 MG tablet Take 1 tablet by mouth once daily 90 tablet 0   cetirizine (ZYRTEC) 10 MG tablet Take 10 mg by mouth daily.     Multiple Vitamin (MULTIVITAMIN) capsule Take 1 capsule by mouth daily.     norethindrone (MICRONOR) 0.35 MG tablet Take 1  tablet by mouth once daily 84 tablet 3   Vitamin D, Ergocalciferol, (DRISDOL) 1.25 MG (50000 UNIT) CAPS capsule Take 1 capsule (50,000 Units total) by mouth every 7 (seven) days. 4 capsule 5   No facility-administered medications prior to visit.    No Known Allergies  ROS Review of Systems  Constitutional:  Negative for diaphoresis, fatigue, fever and unexpected weight change.  HENT: Negative.    Eyes:  Negative for photophobia and visual disturbance.  Respiratory: Negative.    Cardiovascular: Negative.   Gastrointestinal: Negative.   Endocrine: Negative for polyphagia and polyuria.  Genitourinary: Negative.   Musculoskeletal:  Negative for gait problem and joint swelling.  Neurological:  Negative for speech difficulty and weakness.  Psychiatric/Behavioral:  The patient is nervous/anxious.   Depression screen  [facility] 2/9 06/24/2021 05/02/2020 10/31/2019  Decreased Interest 0 0 0  Down, Depressed, Hopeless 1 0 0  PHQ - 2 Score 1 0 0  Altered sleeping - 0 -  Tired, decreased energy - 1 -  Change in appetite - 1 -  Feeling bad or failure about yourself  - 0 -  Trouble concentrating - 0 -  Moving slowly or fidgety/restless - 0 -  Suicidal thoughts - 0 -  PHQ-9 Score - 2 -  Difficult doing work/chores - Not difficult at all -       Objective:    Physical Exam Vitals and nursing note reviewed.  Constitutional:      General: She is not in acute distress.    Appearance: Normal appearance. She is obese. She is not ill-appearing, toxic-appearing or diaphoretic.  HENT:     Head: Normocephalic and atraumatic.     Right Ear: Tympanic membrane, ear canal and external ear normal.     Left Ear: Tympanic membrane, ear canal and external ear normal.     Mouth/Throat:     Mouth: Mucous membranes are moist.     Pharynx: Oropharynx is clear. No oropharyngeal exudate or posterior oropharyngeal erythema.  Eyes:     General:        Right eye: No discharge.        Left eye: No discharge.     Extraocular Movements: Extraocular movements intact.     Conjunctiva/sclera: Conjunctivae normal.     Pupils: Pupils are equal, round, and reactive to light.  Cardiovascular:     Rate and Rhythm: Normal rate and regular rhythm.  Pulmonary:     Effort: Pulmonary effort is normal.     Breath sounds: Normal breath sounds.  Abdominal:     General: Bowel sounds are normal.  Musculoskeletal:     Cervical back: No rigidity or tenderness.     Right lower leg: No edema.     Left lower leg: No edema.  Lymphadenopathy:     Cervical: No cervical adenopathy.  Skin:    General: Skin is warm and dry.  Neurological:     Mental Status: She is alert and oriented to person, place, and time.  Psychiatric:        Mood and Affect: Mood normal.        Behavior: Behavior normal.    BP 132/88 (BP Location: Right Arm, Patient  Position: Sitting, Cuff Size: Large)   Pulse 90   Temp (!) 97 F (36.1 C) (Temporal)   Ht 5\' 5"  (1.651 m)   Wt (!) 311 lb 6.4 oz (141.3 kg)   SpO2 96%   BMI 51.82 kg/m  Wt Readings from Last 3 Encounters:  06/24/21 (!) 311 lb 6.4 oz (141.3 kg)  03/22/21 (!) 317 lb (143.8 kg)  05/02/20 (!) 311 lb 6.4 oz (141.3 kg)     Health Maintenance Due  Topic Date Due   INFLUENZA VACCINE  02/25/2021    There are no preventive care reminders to display for this patient.  Lab Results  Component Value Date   TSH 1.350 05/17/2019   Lab Results  Component Value Date   WBC 12.5 (H) 05/18/2020   HGB 12.2 05/18/2020   HCT 37.7 05/18/2020   MCV 80.7 05/18/2020   PLT 387.0 05/18/2020   Lab Results  Component Value Date   NA 138 05/18/2020   K 3.7 05/18/2020   CO2 25 05/18/2020   GLUCOSE 90 05/18/2020   BUN 13 05/18/2020   CREATININE 0.80 05/18/2020   BILITOT 0.5 05/18/2020   ALKPHOS 92 05/18/2020   AST 15 05/18/2020   ALT 10 05/18/2020   PROT 7.1 05/18/2020   ALBUMIN 3.8 05/18/2020   CALCIUM 8.9 05/18/2020   ANIONGAP 6 05/11/2019   GFR 94.97 05/18/2020   Lab Results  Component Value Date   CHOL 155 05/18/2020   Lab Results  Component Value Date   HDL 39.50 05/18/2020   Lab Results  Component Value Date   LDLCALC 106 (H) 05/18/2020   Lab Results  Component Value Date   TRIG 48.0 05/18/2020   Lab Results  Component Value Date   CHOLHDL 4 05/18/2020   Lab Results  Component Value Date   HGBA1C 6.3 05/18/2020      Assessment & Plan:   Problem List Items Addressed This Visit       Other   Healthcare maintenance - Primary   Relevant Orders   CBC   Comprehensive metabolic panel   Lipid panel   Class 3 severe obesity with serious comorbidity and body mass index (BMI) of 45.0 to 49.9 in adult [facility])   Vitamin D deficiency   Relevant Medications   Vitamin D, Ergocalciferol, (DRISDOL) 1.25 MG (50000 UNIT) CAPS capsule   Other Relevant Orders   VITAMIN D 25  Hydroxy (Vit-D Deficiency, Fractures)   Prediabetes   Relevant Orders   Comprehensive metabolic panel   Hemoglobin A1c   Other Visit Diagnoses     Elevated blood pressure reading with diagnosis of hypertension       Relevant Orders   Comprehensive metabolic panel   Urinalysis, Routine w reflex microscopic   Adjustment disorder with anxious mood       Relevant Medications   PARoxetine (PAXIL) 10 MG tablet   Flu vaccine need       Relevant Orders   Flu Vaccine QUAD 6+ mos PF IM (Fluarix Quad PF)       Meds ordered this encounter  Medications   PARoxetine (PAXIL) 10 MG tablet    Sig: Take 1 tablet (10 mg total) by mouth daily.    Dispense:  90 tablet    Refill:  0   Vitamin D, Ergocalciferol, (DRISDOL) 1.25 MG (50000 UNIT) CAPS capsule    Sig: Take 1 capsule (50,000 Units total) by mouth every 7 (seven) days.    Dispense:  12 capsule    Refill:  3     Follow-up: Return in about 3 months (around 09/24/2021).   Agrees to give low-dose Paxil try.  We will adjust the time of day that she takes it.  Information was given on adjustment disorder.  Encouraged physical activity.  Discussed the importance of maintaining  her.  BP is mildly elevated.  Understandable considering circumstances.  Information was given on hypertension.  Information also given on health maintenance and disease prevention.  Asked her to let me know how I can help with revisiting possibility of bariatric surgery. Libby Maw, MD

## 2021-07-25 ENCOUNTER — Encounter (HOSPITAL_BASED_OUTPATIENT_CLINIC_OR_DEPARTMENT_OTHER): Payer: Self-pay

## 2021-07-25 ENCOUNTER — Emergency Department (HOSPITAL_BASED_OUTPATIENT_CLINIC_OR_DEPARTMENT_OTHER): Payer: BC Managed Care – PPO

## 2021-07-25 ENCOUNTER — Other Ambulatory Visit: Payer: Self-pay

## 2021-07-25 ENCOUNTER — Emergency Department (HOSPITAL_BASED_OUTPATIENT_CLINIC_OR_DEPARTMENT_OTHER)
Admission: EM | Admit: 2021-07-25 | Discharge: 2021-07-26 | Disposition: A | Discharge status: Home / Self Care | Payer: BC Managed Care – PPO | Attending: Emergency Medicine | Admitting: Emergency Medicine

## 2021-07-25 DIAGNOSIS — R110 Nausea: Secondary | ICD-10-CM | POA: Diagnosis not present

## 2021-07-25 DIAGNOSIS — R072 Precordial pain: Secondary | ICD-10-CM | POA: Diagnosis not present

## 2021-07-25 DIAGNOSIS — R079 Chest pain, unspecified: Secondary | ICD-10-CM

## 2021-07-25 DIAGNOSIS — F410 Panic disorder [episodic paroxysmal anxiety] without agoraphobia: Secondary | ICD-10-CM | POA: Insufficient documentation

## 2021-07-25 DIAGNOSIS — R209 Unspecified disturbances of skin sensation: Secondary | ICD-10-CM | POA: Diagnosis not present

## 2021-07-25 DIAGNOSIS — R0602 Shortness of breath: Secondary | ICD-10-CM | POA: Diagnosis not present

## 2021-07-25 DIAGNOSIS — R109 Unspecified abdominal pain: Secondary | ICD-10-CM | POA: Diagnosis not present

## 2021-07-25 DIAGNOSIS — I10 Essential (primary) hypertension: Secondary | ICD-10-CM | POA: Diagnosis not present

## 2021-07-25 DIAGNOSIS — R0789 Other chest pain: Secondary | ICD-10-CM | POA: Diagnosis not present

## 2021-07-25 DIAGNOSIS — Z79899 Other long term (current) drug therapy: Secondary | ICD-10-CM | POA: Insufficient documentation

## 2021-07-25 LAB — BASIC METABOLIC PANEL
Anion gap: 9 (ref 5–15)
BUN: 17 mg/dL (ref 6–20)
CO2: 24 mmol/L (ref 22–32)
Calcium: 8.6 mg/dL — ABNORMAL LOW (ref 8.9–10.3)
Chloride: 101 mmol/L (ref 98–111)
Creatinine, Ser: 0.8 mg/dL (ref 0.44–1.00)
GFR, Estimated: >60 mL/min (ref >60)
Glucose, Bld: 116 mg/dL — ABNORMAL HIGH (ref 70–99)
Potassium: 3.5 mmol/L (ref 3.5–5.1)
Sodium: 134 mmol/L — ABNORMAL LOW (ref 135–145)

## 2021-07-25 LAB — TROPONIN I (HIGH SENSITIVITY): Troponin I (High Sensitivity): 4 ng/L (ref <18)

## 2021-07-25 LAB — CBC
HCT: 39.2 % (ref 36.0–46.0)
Hemoglobin: 12.6 g/dL (ref 12.0–15.0)
MCH: 26.4 pg (ref 26.0–34.0)
MCHC: 32.1 g/dL (ref 30.0–36.0)
MCV: 82.2 fL (ref 80.0–100.0)
Platelets: 461 10*3/uL — ABNORMAL HIGH (ref 150–400)
RBC: 4.77 MIL/uL (ref 3.87–5.11)
RDW: 14.6 % (ref 11.5–15.5)
WBC: 15.9 10*3/uL — ABNORMAL HIGH (ref 4.0–10.5)
nRBC: 0 % (ref 0.0–0.2)

## 2021-07-25 LAB — PREGNANCY, URINE: Preg Test, Ur: NEGATIVE

## 2021-07-25 NOTE — ED Triage Notes (Addendum)
Pt reports episode of CP, tingling all over, nausea, SOB, abd pain while driving ~[id number] episode has "calmed"-states felt like a panic attack-denies CP at present-denies fever/flu sx-NAD-steady gait

## 2021-07-25 NOTE — ED Provider Notes (Signed)
[facility] DEPT [facility] Provider Note: Courtney Spurling, MD, FACEP  CSN: [id number] MRN: [id number] ARRIVAL: 07/25/21 at 2057 ROOM: [place]  Chest Pain   HISTORY OF PRESENT ILLNESS  07/25/21 11:51 PM Courtney Mooney is a 35 y.o. female who had an episode of chest pain, tingling all over, nausea, shortness of breath and abdominal burning while driving about 5 PM.  The chest pain was located in the precordium radiating to the left arm.  It had both pressure and pain components.  She describes it as feeling like a panic attack.  It lasted about 10 minutes and then resolved but recurred 2 or 3 more times.  Symptoms are moderate to severe.  Nothing made symptoms better or worse.  She was recently started on Paxil.   Past Medical History:  Diagnosis Date   Anemia    Anxiety    HSV-2 (herpes simplex virus 2) infection    Hypertension    PCOS (polycystic ovarian syndrome)    Prediabetes    Vaginal Pap smear, abnormal    Vitamin D deficiency     Past Surgical History:  Procedure Laterality Date   CESAREAN SECTION     CESAREAN SECTION N/A 07/15/2017   Procedure: CESAREAN SECTION;  Surgeon: Jonnie Kind, MD;  Location: [facility];  Service: Obstetrics;  Laterality: N/A;   CHOLECYSTECTOMY      Family History  Problem Relation Age of Onset   Hypertension Mother    Sleep apnea Mother    Obesity Mother    Diabetes Father    Heart disease Father    Alcoholism Father    Drug abuse Father    Obesity Father    Colon cancer Maternal Grandmother    Diabetes Maternal Grandmother    Deep vein thrombosis Maternal Grandmother    Pulmonary embolism Maternal Grandmother    Esophageal cancer Neg Hx    Rectal cancer Neg Hx    Stomach cancer Neg Hx     Social History   Tobacco Use   Smoking status: Never   Smokeless tobacco: Never  Vaping Use   Vaping Use: Never used  Substance Use Topics   Alcohol use: Yes    Comment: daily   Drug use: Yes     Types: Marijuana    Prior to Admission medications   Medication Sig Start Date End Date Taking? Authorizing Provider  amLODipine (NORVASC) 5 MG tablet Take 1 tablet by mouth once daily 05/01/21   Libby Maw, MD  cetirizine (ZYRTEC) 10 MG tablet Take 10 mg by mouth daily.    [provider]  Multiple Vitamin (MULTIVITAMIN) capsule Take 1 capsule by mouth daily.    [provider]  norethindrone (MICRONOR) 0.35 MG tablet Take 1 tablet by mouth once daily 03/13/21   Truett Mainland, DO  PARoxetine (PAXIL) 10 MG tablet Take 1 tablet (10 mg total) by mouth daily. 06/24/21   Libby Maw, MD  Vitamin D, Ergocalciferol, (DRISDOL) 1.25 MG (50000 UNIT) CAPS capsule Take 1 capsule (50,000 Units total) by mouth every 7 (seven) days. 06/24/21   Libby Maw, MD    Allergies Patient has no known allergies.   REVIEW OF SYSTEMS  Negative except as noted here or in the History of Present Illness.   PHYSICAL EXAMINATION  Initial Vital Signs Blood pressure (!) 153/106, pulse (!) 107, temperature 99.3 F (37.4 C), temperature source Oral, resp. rate 18, height 5\' 5"  (1.651 m), weight (!) 145.6  kg, last menstrual period 07/17/2021, SpO2 99 %.  Examination General: Well-developed, well-nourished female in no acute distress; appearance consistent with age of record HENT: normocephalic; atraumatic Eyes: pupils equal, round and reactive to light; extraocular muscles intact Neck: supple Heart: regular rate and rhythm; no murmurs, rubs or gallops Lungs: clear to auscultation bilaterally Abdomen: soft; nondistended; nontender; no masses or hepatosplenomegaly; bowel sounds present Extremities: No deformity; full range of motion; pulses normal Neurologic: Awake, alert and oriented; motor function intact in all extremities and symmetric; no facial droop Skin: Warm and dry Psychiatric: Normal mood and affect   RESULTS  Summary of this visit's results,  reviewed and interpreted by myself:   EKG Interpretation  Date/Time:  Thursday July 25 2021 21:05:11 EST Ventricular Rate:  106 PR Interval:  160 QRS Duration: 78 QT Interval:  342 QTC Calculation: 696 R Axis:   71 Text Interpretation: Sinus tachycardia Nonspecific T wave abnormality Abnormal ECG Rate is faster Confirmed by Tamela Elsayed [phone]) on 07/25/2021 11:43:13 PM       Laboratory Studies: Results for orders placed or performed during the hospital encounter of 07/25/21 (from the past 24 hour(s))  Basic metabolic panel     Status: Abnormal   Collection Time: 07/25/21  9:34 PM  Result Value Ref Range   Sodium 134 (L) 135 - 145 mmol/L   Potassium 3.5 3.5 - 5.1 mmol/L   Chloride 101 98 - 111 mmol/L   CO2 24 22 - 32 mmol/L   Glucose, Bld 116 (H) 70 - 99 mg/dL   BUN 17 6 - 20 mg/dL   Creatinine, Ser 0.80 0.44 - 1.00 mg/dL   Calcium 8.6 (L) 8.9 - 10.3 mg/dL   GFR, Estimated >60 >60 mL/min   Anion gap 9 5 - 15  CBC     Status: Abnormal   Collection Time: 07/25/21  9:34 PM  Result Value Ref Range   WBC 15.9 (H) 4.0 - 10.5 K/uL   RBC 4.77 3.87 - 5.11 MIL/uL   Hemoglobin 12.6 12.0 - 15.0 g/dL   HCT 39.2 36.0 - 46.0 %   MCV 82.2 80.0 - 100.0 fL   MCH 26.4 26.0 - 34.0 pg   MCHC 32.1 30.0 - 36.0 g/dL   RDW 14.6 11.5 - 15.5 %   Platelets 461 (H) 150 - 400 K/uL   nRBC 0.0 0.0 - 0.2 %  Troponin I (High Sensitivity)     Status: None   Collection Time: 07/25/21  9:34 PM  Result Value Ref Range   Troponin I (High Sensitivity) 4 <18 ng/L  Pregnancy, urine     Status: None   Collection Time: 07/25/21  9:34 PM  Result Value Ref Range   Preg Test, Ur NEGATIVE NEGATIVE  Troponin I (High Sensitivity)     Status: None   Collection Time: 07/26/21 12:46 AM  Result Value Ref Range   Troponin I (High Sensitivity) 3 <18 ng/L   Imaging Studies: DG Chest 2 View  Result Date: 07/25/2021 CLINICAL DATA:  Chest pain, nausea, shortness of breath, and abdominal pain. EXAM: CHEST - 2  VIEW COMPARISON:  10/06/2019 FINDINGS: The heart size and mediastinal contours are within normal limits. Both lungs are clear. The visualized skeletal structures are unremarkable. IMPRESSION: No active cardiopulmonary disease. Electronically Signed   By: Lucienne Capers M.D.   On: 07/25/2021 22:02    ED COURSE and MDM  Nursing notes, initial and subsequent vitals signs, including pulse oximetry, reviewed and interpreted by myself.  Vitals:  07/25/21 2107 07/25/21 2123  BP: (!) 153/106   Pulse: (!) 107   Resp: 18   Temp: 99.3 F (37.4 C)   TempSrc: Oral   SpO2: 99%   Weight:  (!) 145.6 kg  Height:  5\' 5"  (1.651 m)   Medications - No data to display  No evidence of cardiac event on EKG or troponins.  Her symptoms were more consistent with a panic attack.  She has been on Paxil for about 2 weeks and this could represent an adverse reaction to Paxil.  She was advised to consult her prescriber about this.  She was advised to return if symptoms change or worsen.  PROCEDURES  Procedures   ED DIAGNOSES     ICD-10-CM   1. Nonspecific chest pain  R07.9          Shanon Rosser, MD 07/26/21 [phone]

## 2021-07-26 LAB — TROPONIN I (HIGH SENSITIVITY): Troponin I (High Sensitivity): 3 ng/L (ref <18)

## 2021-07-29 ENCOUNTER — Other Ambulatory Visit: Payer: Self-pay | Admitting: Family Medicine

## 2021-07-29 DIAGNOSIS — I10 Essential (primary) hypertension: Secondary | ICD-10-CM

## 2021-09-23 ENCOUNTER — Other Ambulatory Visit: Payer: Self-pay

## 2021-09-24 ENCOUNTER — Ambulatory Visit: Payer: BC Managed Care – PPO | Admitting: Family Medicine

## 2021-09-24 ENCOUNTER — Encounter: Payer: Self-pay | Admitting: Family Medicine

## 2021-09-24 VITALS — BP 126/84 | HR 72 | Temp 97.5°F | Ht 65.0 in | Wt 318.4 lb

## 2021-09-24 DIAGNOSIS — Z9189 Other specified personal risk factors, not elsewhere classified: Secondary | ICD-10-CM

## 2021-09-24 DIAGNOSIS — R7303 Prediabetes: Secondary | ICD-10-CM | POA: Diagnosis not present

## 2021-09-24 DIAGNOSIS — I10 Essential (primary) hypertension: Secondary | ICD-10-CM

## 2021-09-24 DIAGNOSIS — E559 Vitamin D deficiency, unspecified: Secondary | ICD-10-CM | POA: Diagnosis not present

## 2021-09-24 DIAGNOSIS — Z130 Encounter for screening for diseases of the blood and blood-forming organs and certain disorders involving the immune mechanism: Secondary | ICD-10-CM

## 2021-09-24 LAB — BASIC METABOLIC PANEL
BUN: 18 mg/dL (ref 6–23)
CO2: 28 mEq/L (ref 19–32)
Calcium: 9.4 mg/dL (ref 8.4–10.5)
Chloride: 104 mEq/L (ref 96–112)
Creatinine, Ser: 0.96 mg/dL (ref 0.40–1.20)
GFR: 75.59 mL/min (ref >60.00)
Glucose, Bld: 98 mg/dL (ref 70–99)
Potassium: 4 mEq/L (ref 3.5–5.1)
Sodium: 139 mEq/L (ref 135–145)

## 2021-09-24 LAB — VITAMIN D 25 HYDROXY (VIT D DEFICIENCY, FRACTURES): VITD: 48.09 ng/mL (ref 30.00–100.00)

## 2021-09-24 LAB — CBC
HCT: 39.2 % (ref 36.0–46.0)
Hemoglobin: 12.5 g/dL (ref 12.0–15.0)
MCHC: 31.9 g/dL (ref 30.0–36.0)
MCV: 81.5 fl (ref 78.0–100.0)
Platelets: 387 10*3/uL (ref 150.0–400.0)
RBC: 4.81 Mil/uL (ref 3.87–5.11)
RDW: 14.8 % (ref 11.5–15.5)
WBC: 9.9 10*3/uL (ref 4.0–10.5)

## 2021-09-24 LAB — HEMOGLOBIN A1C: Hgb A1c MFr Bld: 6.2 % (ref 4.6–6.5)

## 2021-09-24 NOTE — Progress Notes (Signed)
Established Patient Office Visit  Subjective:  Patient ID: Courtney Mooney, female    DOB: 1986-05-25  Age: 36 y.o. MRN: [id number]  CC:  Chief Complaint  Patient presents with   Follow-up    3 month follow up on anxiety. Stop taking Paxil about 2 months ago started on supplement, patient concerns about iron levels. Patient fasting for labs.     HPI [place] presents for follow-up of anxiety, hypertension, prediabetes, obesity and vitamin D deficiency.  Taking high-dose weekly vitamin D supplement.  Blood pressure well controlled with amlodipine.  Discontinued Paxil secondary to side effect.  She is taking a natural product that seems to be helping.  She is in counseling.  This is helping.  She started an exercise program by walking.  This is also helping her anxiety and she is lost some weight.  Past Medical History:  Diagnosis Date   Anemia    Anxiety    HSV-2 (herpes simplex virus 2) infection    Hypertension    PCOS (polycystic ovarian syndrome)    Prediabetes    Vaginal Pap smear, abnormal    Vitamin D deficiency     Past Surgical History:  Procedure Laterality Date   CESAREAN SECTION     CESAREAN SECTION N/A 07/15/2017   Procedure: CESAREAN SECTION;  Surgeon: Jonnie Kind, MD;  Location: [facility];  Service: Obstetrics;  Laterality: N/A;   CHOLECYSTECTOMY      Family History  Problem Relation Age of Onset   Hypertension Mother    Sleep apnea Mother    Obesity Mother    Diabetes Father    Heart disease Father    Alcoholism Father    Drug abuse Father    Obesity Father    Colon cancer Maternal Grandmother    Diabetes Maternal Grandmother    Deep vein thrombosis Maternal Grandmother    Pulmonary embolism Maternal Grandmother    Esophageal cancer Neg Hx    Rectal cancer Neg Hx    Stomach cancer Neg Hx     Social History   Socioeconomic History   Marital status: Divorced    Spouse name: Not on file   Number of children: 2   Years  of education: Not on file   Highest education level: Not on file  Occupational History   Occupation: Medical office/ clerical  Tobacco Use   Smoking status: Never   Smokeless tobacco: Never  Vaping Use   Vaping Use: Never used  Substance and Sexual Activity   Alcohol use: Yes    Comment: daily   Drug use: Yes    Types: Marijuana   Sexual activity: Yes    Birth control/protection: Pill  Other Topics Concern   Not on file  Social History Narrative   Not on file   Social Determinants of Health   Financial Resource Strain: Not on file  Food Insecurity: Not on file  Transportation Needs: Not on file  Physical Activity: Not on file  Stress: Not on file  Social Connections: Not on file  Intimate Partner Violence: Not on file    Outpatient Medications Prior to Visit  Medication Sig Dispense Refill   amLODipine (NORVASC) 5 MG tablet Take 1 tablet by mouth once daily 90 tablet 0   ASHWAGANDHA PO Take by mouth.     cetirizine (ZYRTEC) 10 MG tablet Take 10 mg by mouth daily.     Multiple Vitamin (MULTIVITAMIN) capsule Take 1 capsule by mouth daily.  norethindrone (MICRONOR) 0.35 MG tablet Take 1 tablet by mouth once daily 84 tablet 3   Vitamin D, Ergocalciferol, (DRISDOL) 1.25 MG (50000 UNIT) CAPS capsule Take 1 capsule (50,000 Units total) by mouth every 7 (seven) days. 12 capsule 3   PARoxetine (PAXIL) 10 MG tablet Take 1 tablet (10 mg total) by mouth daily. (Patient not taking: Reported on 09/24/2021) 90 tablet 0   No facility-administered medications prior to visit.    No Known Allergies  ROS Review of Systems  Constitutional:  Negative for diaphoresis, fatigue, fever and unexpected weight change.  HENT: Negative.    Eyes:  Negative for photophobia and visual disturbance.  Respiratory: Negative.    Cardiovascular: Negative.   Gastrointestinal: Negative.   Endocrine: Positive for cold intolerance. Negative for polyphagia and polyuria.  Genitourinary: Negative.    Musculoskeletal:  Negative for gait problem and joint swelling.  Neurological:  Negative for speech difficulty and weakness.  Hematological:  Does not bruise/bleed easily.     Depression screen [facility] 2/9 09/24/2021 09/24/2021 06/24/2021  Decreased Interest 0 0 0  Down, Depressed, Hopeless 1 1 1   PHQ - 2 Score 1 1 1   Altered sleeping 0 - 1  Tired, decreased energy 0 - 1  Change in appetite 0 - 0  Feeling bad or failure about yourself  0 - 0  Trouble concentrating 0 - 1  Moving slowly or fidgety/restless 0 - 0  Suicidal thoughts 0 - 0  PHQ-9 Score 1 - 4  Difficult doing work/chores Not difficult at all - Not difficult at all     Objective:    Physical Exam Vitals and nursing note reviewed.  Constitutional:      General: She is not in acute distress.    Appearance: Normal appearance. She is obese. She is not ill-appearing, toxic-appearing or diaphoretic.  HENT:     Head: Normocephalic and atraumatic.     Right Ear: External ear normal.     Left Ear: External ear normal.     Mouth/Throat:     Mouth: Mucous membranes are moist.     Pharynx: Oropharynx is clear. No oropharyngeal exudate or posterior oropharyngeal erythema.  Eyes:     General: No scleral icterus.       Right eye: No discharge.        Left eye: No discharge.     Extraocular Movements: Extraocular movements intact.     Conjunctiva/sclera: Conjunctivae normal.     Pupils: Pupils are equal, round, and reactive to light.  Cardiovascular:     Rate and Rhythm: Normal rate and regular rhythm.  Pulmonary:     Effort: Pulmonary effort is normal.     Breath sounds: Normal breath sounds.  Abdominal:     General: Bowel sounds are normal.  Musculoskeletal:     Cervical back: No rigidity or tenderness.  Lymphadenopathy:     Cervical: No cervical adenopathy.  Skin:    General: Skin is warm and dry.  Neurological:     Mental Status: She is alert.  Psychiatric:        Mood and Affect: Mood normal.        Behavior:  Behavior normal.    BP 126/84 (BP Location: Right Arm, Patient Position: Sitting, Cuff Size: Large)    Pulse 72    Temp (!) 97.5 F (36.4 C) (Temporal)    Ht 5\' 5"  (1.651 m)    Wt (!) 318 lb 6.4 oz (144.4 kg)    BMI 52.98 kg/m  Wt Readings from Last 3 Encounters:  09/24/21 (!) 318 lb 6.4 oz (144.4 kg)  07/25/21 (!) 321 lb (145.6 kg)  06/24/21 (!) 311 lb 6.4 oz (141.3 kg)     There are no preventive care reminders to display for this patient.  There are no preventive care reminders to display for this patient.  Lab Results  Component Value Date   TSH 1.350 05/17/2019   Lab Results  Component Value Date   WBC 15.9 (H) 07/25/2021   HGB 12.6 07/25/2021   HCT 39.2 07/25/2021   MCV 82.2 07/25/2021   PLT 461 (H) 07/25/2021   Lab Results  Component Value Date   NA 134 (L) 07/25/2021   K 3.5 07/25/2021   CO2 24 07/25/2021   GLUCOSE 116 (H) 07/25/2021   BUN 17 07/25/2021   CREATININE 0.80 07/25/2021   BILITOT 0.3 06/24/2021   ALKPHOS 91 06/24/2021   AST 16 06/24/2021   ALT 12 06/24/2021   PROT 7.4 06/24/2021   ALBUMIN 4.0 06/24/2021   CALCIUM 8.6 (L) 07/25/2021   ANIONGAP 9 07/25/2021   GFR 79.69 06/24/2021   Lab Results  Component Value Date   CHOL 151 06/24/2021   Lab Results  Component Value Date   HDL 31.20 (L) 06/24/2021   Lab Results  Component Value Date   LDLCALC 108 (H) 06/24/2021   Lab Results  Component Value Date   TRIG 59.0 06/24/2021   Lab Results  Component Value Date   CHOLHDL 5 06/24/2021   Lab Results  Component Value Date   HGBA1C 6.3 06/24/2021      Assessment & Plan:   Problem List Items Addressed This Visit       Cardiovascular and Mediastinum   Essential hypertension   Relevant Orders   Basic Metabolic Panel (BMET)     Other   Screening for iron deficiency anemia   Relevant Orders   CBC   Iron, TIBC and Ferritin Panel   Vitamin D deficiency - Primary   Relevant Orders   VITAMIN D 25 Hydroxy (Vit-D Deficiency,  Fractures)   Prediabetes   Relevant Orders   Hemoglobin I7O   Basic Metabolic Panel (BMET)   At risk for diabetes mellitus    No orders of the defined types were placed in this encounter.   Follow-up: Return in about 6 months (around 03/24/2022), or 3 month follow up may be needed if we start medications..  We will continue with counseling and exercise therapy.  Discussed Ozempic and metformin briefly.  Discussed risks of Ozempic including thyroid C cancer in laboratory mice.  Libby Maw, MD

## 2021-09-25 LAB — IRON,TIBC AND FERRITIN PANEL
%SAT: 20 % (calc) (ref 16–45)
Ferritin: 35 ng/mL (ref 16–154)
Iron: 56 ug/dL (ref 40–190)
TIBC: 279 mcg/dL (calc) (ref 250–450)

## 2021-10-02 ENCOUNTER — Encounter: Payer: Self-pay | Admitting: Obstetrics & Gynecology

## 2021-10-02 ENCOUNTER — Ambulatory Visit: Payer: BC Managed Care – PPO | Admitting: Obstetrics & Gynecology

## 2021-10-02 ENCOUNTER — Other Ambulatory Visit: Payer: Self-pay

## 2021-10-02 VITALS — BP 140/83 | HR 90 | Ht 65.0 in | Wt 317.0 lb

## 2021-10-02 DIAGNOSIS — N939 Abnormal uterine and vaginal bleeding, unspecified: Secondary | ICD-10-CM

## 2021-10-02 DIAGNOSIS — Z3009 Encounter for other general counseling and advice on contraception: Secondary | ICD-10-CM | POA: Diagnosis not present

## 2021-10-02 DIAGNOSIS — D219 Benign neoplasm of connective and other soft tissue, unspecified: Secondary | ICD-10-CM | POA: Diagnosis not present

## 2021-10-02 NOTE — Patient Instructions (Signed)
Laparoscopic Tubal Ligation Laparoscopic tubal ligation is a procedure to close the fallopian tubes. This is done to prevent pregnancy. When the fallopian tubes are closed, the eggs that your ovaries release cannot enter the uterus, and sperm cannot reach the released eggs. You should not have this procedure if you want to get pregnant someday or if you are unsure about having more children. Tell a health care provider about: Any allergies you have. All medicines you are taking, including vitamins, herbs, eye drops, creams, and over-the-counter medicines. Any problems you or family members have had with anesthetic medicines. Any blood disorders you have. Any surgeries you have had. Any medical conditions you have. Whether you are pregnant or may be pregnant. Any past pregnancies. What are the risks? Generally, this is a safe procedure. However, problems may occur, including: Infection. Bleeding. Injury to other organs in the abdomen. Side effects from anesthetic medicines. Failure of the procedure. This procedure can increase your risk of an ectopic pregnancy. This is a pregnancy in which a fertilized egg attaches to the outside of the uterus. What happens before the procedure? Staying hydrated Follow instructions from your health care provider about hydration, which may include: Up to 2 hours before the procedure - you may continue to drink clear liquids, such as water, clear fruit juice, black coffee, and plain tea. Eating and drinking restrictions Follow instructions from your health care provider about eating and drinking, which may include: 8 hours before the procedure - stop eating heavy meals or foods, such as meat, fried foods, or fatty foods. 6 hours before the procedure - stop eating light meals or foods, such as toast or cereal. 6 hours before the procedure - stop drinking milk or drinks that contain milk. 2 hours before the procedure - stop drinking clear  liquids. Medicines Ask your health care provider about: Changing or stopping your regular medicines. This is especially important if you are taking diabetes medicines or blood thinners. Taking medicines such as aspirin and ibuprofen. These medicines can thin your blood. Do not take these medicines unless your health care provider tells you to take them. Taking over-the-counter medicines, vitamins, herbs, and supplements. Surgery safety Ask your health care provider: How your surgery site will be marked. What steps will be taken to help prevent infection. These steps may include: Removing hair at the surgery site. Washing skin with a germ-killing soap. Taking antibiotic medicine. General instructions Do not use any products that contain nicotine or tobacco for at least 4 weeks before the procedure. These products include cigarettes, chewing tobacco, and vaping devices, such as e-cigarettes. If you need help quitting, ask your health care provider. Plan to have someone take you home from the hospital. If you will be going home right after the procedure, plan to have a responsible adult care for you for the time you are told. This is important. What happens during the procedure?   An IV will be inserted into one of your veins. You will be given one or more of the following: A medicine to help you relax (sedative). A medicine to numb the area (local anesthetic). A medicine to make you fall asleep (general anesthetic). A medicine that is injected into an area of your body to numb everything below the injection site (regional anesthetic). Your bladder may be emptied with a small tube (catheter). If you have been given a general anesthetic, a tube will be put down your throat to help you breathe. Two small incisions will be made  in your lower abdomen and near your belly button. Your abdomen will be inflated with a gas. This will let the surgeon see better and will give the surgeon room to  work. A lighted tube with camera (laparoscope) will be inserted into your abdomen through one of the incisions. Small instruments will be inserted through the other incision. The fallopian tubes will be tied off, burned (cauterized), or blocked with a clip, ring, or clamp. A small portion in the center of each fallopian tube may be removed. The gas will be released from the abdomen. The incisions will be closed with stitches (sutures). A bandage (dressing) will be placed over the incisions. The procedure may vary among health care providers and hospitals. What happens after the procedure? Your blood pressure, heart rate, breathing rate, and blood oxygen level will be monitored until you leave the hospital. You will be given medicine to help with pain, nausea, and vomiting as needed. You may have vaginal discharge after the procedure. You may need to wear a sanitary napkin. If you were given a sedative during the procedure, it can affect you for several hours. Do not drive or operate machinery until your health care provider says that it is safe. Summary Laparoscopic tubal ligation is a procedure that is done to prevent pregnancy. You should not have this procedure if you want to get pregnant someday or if you are unsure about having more children. The procedure is done using a thin, lighted tube (laparoscope) with a camera attached that will be inserted into your abdomen through an incision. After the procedure you will be given medicine to help with pain, nausea, and vomiting as needed. Plan to have someone take you home from the hospital. This information is not intended to replace advice given to you by your health care provider. Make sure you discuss any questions you have with your health care provider. Document Revised: 03/30/2020 Document Reviewed: 03/30/2020 Elsevier Patient Education  [place]. Laparoscopic Tubal Ligation, Care After The following information offers guidance  on how to care for yourself after your procedure. Your health care provider may also give you more specific instructions. If you have problems or questions, contact your health care provider. What can I expect after the procedure? After the procedure, it is common to have: A sore throat if general anesthesia was used. Pain in shoulders, back, and abdomen. This is caused by the gas that was used during the procedure. Mild discomfort or cramping in your abdomen. Pain or soreness in the area where the surgical incision was made. A bloated feeling. Tiredness. Nausea and vomiting. Follow these instructions at home: Medicines Take over-the-counter and prescription medicines only as told by your health care provider. Ask your health care provider if the medicine prescribed to you: Requires you to avoid driving or using heavy machinery. Can cause constipation. You may need to take these actions to prevent or treat constipation: Drink enough fluid to keep your urine pale yellow. Take over-the-counter or prescription medicines. Eat foods that are high in fiber, such as beans, whole grains, and fresh fruits and vegetables. Limit foods that are high in fat and processed sugars, such as fried or sweet foods. Do not take aspirin because it can cause bleeding. Incision care   Follow instructions from your health care provider about how to take care of your incision. Make sure you: Wash your hands with soap and water for at least 20 seconds before and after you change your bandage (dressing). If  soap and water are not available, use hand sanitizer. Change your dressing as told by your health care provider. Leave stitches (sutures), skin glue, or adhesive strips in place. These skin closures may need to stay in place for 2 weeks or longer. If adhesive strip edges start to loosen and curl up, you may trim the loose edges. Do not remove adhesive strips completely unless your health care provider tells you to  do that. Check your incision area every day for signs of infection. Check for: Redness, swelling, or more pain. Fluid or blood. Warmth. Pus or a bad smell. Activity Rest as told by your health care provider. Avoid sitting for a long time without moving. Get up to take short walks every 1-2 hours. This is important to improve blood flow and breathing. Ask for help if you feel weak or unsteady. Do not have sex, douche, or put a tampon or anything else in your vagina for 6 weeks or as long as told by your health care provider. Do not lift anything that is heavier than your baby for 2 weeks, or the limit that you are told, until your health care provider says that it is safe. Do not take baths, swim, or use a hot tub until your health care provider approves. Ask your health care provider if you may take showers. You may only be allowed to take sponge baths. Return to your normal activities as told by your health care provider. Ask your health care provider what activities are safe for you. General instructions After the procedure you may need to wear a sanitary pad for vaginal discharge. Have someone help you with your daily household tasks for the first few days. Keep all follow-up visits. This is important. Contact a health care provider if: You have redness, swelling, or more pain around your incision. Your incision feels warm to the touch. You have pus or a bad smell coming from your incision. The edges of your incision break open after the sutures have been removed. Your pain does not improve after 2-3 days. You have a rash. You repeatedly become dizzy or light-headed. Your pain medicine is not helping. Get help right away if: You have a fever or chills. You faint. You have increasing pain in your abdomen. You have severe pain in one or both of your shoulders. You have fluid or blood coming from your sutures or heavy bleeding from your vagina. You have shortness of breath or  difficulty breathing. You have chest pain, leg pain, or leg swelling. You have ongoing nausea, vomiting, or diarrhea. These symptoms may represent a serious problem that is an emergency. Do not wait to see if the symptoms will go away. Get medical help right away. Call your local emergency services (911 in the [place] Do not drive yourself to the hospital. Summary After the procedure, it is common to have mild discomfort or cramping in your abdomen. After the procedure you may need to wear a sanitary pad for vaginal discharge. Take over-the-counter and prescription medicines only as told by your health care provider. Watch for symptoms that should prompt you to call your health care provider. Keep all follow-up visits. This is important. This information is not intended to replace advice given to you by your health care provider. Make sure you discuss any questions you have with your health care provider. Document Revised: 03/30/2020 Document Reviewed: 03/30/2020 Elsevier Patient Education  2022 [employer].

## 2021-10-02 NOTE — Progress Notes (Signed)
Patient presents for surgical consult- tubal vs hysterectomy. Patient states that cramping has increased and increase in clotting. Patient also notes heavier periods. Kathrene Alu RN  ?

## 2021-10-02 NOTE — Progress Notes (Signed)
History:  ?36 y.o. [id number] here today for  discuss sterilization. Wants to discjhss BTL vs hysterectomy. She is in POPs and wants to stop meds but, does not desire a pregancy and wants something permandnet. She also reports that she is bleeding longer now up to 7 days on the PAP so wants to consider a hysterectomy. Does not want a trial off of the pills to see what her cycle does.  ? ?Pt is s/p c/s x2 and cholecystectomy.   ? ?The following portions of the patient's history were reviewed and updated as appropriate: allergies, current medications, past family history, past medical history, past social history, past surgical history and problem list. ? ?Review of Systems:  ?Pertinent items are noted in HPI. ?   ?Objective:  ?Physical Exam ?Blood pressure 140/83, pulse 90, height '5\' 5"'$  (1.651 m), weight (!) 317 lb (143.8 kg), last menstrual period 09/21/2021. ? ?CONSTITUTIONAL: Well-developed, well-nourished female in no acute distress.  ?HENT:  Normocephalic, atraumatic ?EYES: Conjunctivae and EOM are normal. No scleral icterus.  ?NECK: Normal range of motion ?SKIN: Skin is warm and dry. No rash noted. Not diaphoretic.No pallor. ?[place]: Alert and oriented to person, place, and time. Normal coordination.  ? ? ?Labs and Imaging ?02/2021 ?CLINICAL DATA:  Dysmenorrhea, LMP 03/06/2021 ?  ?EXAM: ?TRANSABDOMINAL AND TRANSVAGINAL ULTRASOUND OF PELVIS ?  ?TECHNIQUE: ?Both transabdominal and transvaginal ultrasound examinations of the ?pelvis were performed. Transabdominal technique was performed for ?global imaging of the pelvis including uterus, ovaries, adnexal ?regions, and pelvic cul-de-sac. It was necessary to proceed with ?endovaginal exam following the transabdominal exam to visualize the ?uterus, endometrium, and ovaries. Transabdominal imaging limited by ?decompressed urinary bladder and body habitus. ?  ?COMPARISON:  None ?  ?FINDINGS: ?Uterus ?  ?Measurements: 8.0 x 4.6 x 4.8 cm = volume: 91 mL.  Anteverted. ?Heterogeneous myometrium. Anterior wall intramural leiomyoma 2.0 x ?2.0 x 1.8 cm. No additional discrete masses. ?  ?Endometrium ?  ?Thickness: 6 mm.  No endometrial fluid or focal abnormality ?  ?Right ovary ?  ?Measurements: 3.1 x 1.5 x 2.8 cm = volume: 7.0 mL. Normal morphology ?without mass ?  ?Left ovary ?  ?Measurements: 5.1 x 2.4 x 1.7 cm = volume: 10.7 mL. Normal ?morphology without mass ?  ?Other findings ?  ?Trace free pelvic fluid.  No adnexal masses. ?  ?IMPRESSION: ?Single intramural leiomyoma anterior uterus 2.0 cm diameter. ?  ?Remainder of exam unremarkable. ?  ? ?Assessment & Plan:  ?Pt desiring permanent sterilization.  Reviewed options for sterilization. Reviewed the risks of hysterectomy for benign indications and that a sterilization is not an indication for hysterectomy. Discussed POPs as a potential etiology of the prolonged menses.     ? ?Patient desires surgical management with bilateral salpingectomy for sterilization.  The risks of surgery were discussed in detail with the patient including but not limited to: bleeding which may require transfusion or reoperation; infection which may require prolonged hospitalization or re-hospitalization and antibiotic therapy; injury to bowel, bladder, ureters and major vessels or other surrounding organs; need for additional procedures including laparotomy; thromboembolic phenomenon, incisional problems and other postoperative or anesthesia complications.  Patient was told that the likelihood that her condition and symptoms will be treated effectively with this surgical management was very high; the postoperative expectations were also discussed in detail. The patient also understands the alternative treatment options which were discussed in full. All questions were answered.  She was told that she will be contacted by our surgical scheduler regarding  the time and date of her surgery; routine preoperative instructions of having nothing to  eat or drink after midnight on the day prior to surgery and also coming to the hospital 1 1/2 hours prior to her time of surgery were also emphasized.  She was told she may be called for a preoperative appointment about a week prior to surgery and will be given further preoperative instructions at that visit. Printed patient education handouts about the procedure were given to the patient to review at home.  ? ?Cooper Stamp L. Harraway-Smith, M.D., [place] ? ? ?

## 2021-10-18 NOTE — Progress Notes (Signed)
Surgical Instructions ? ? ? Your procedure is scheduled on 10/24/21. ? Report to [facility] Main Entrance "A" at 5:30 A.M., then check in with the Admitting office. ? Call this number if you have problems the morning of surgery: ? [phone] ? ? If you have any questions prior to your surgery date call [phone]: Open Monday-Friday 8am-4pm ? ? ? Remember: ? Do not eat after midnight the night before your surgery ? ?You may drink clear liquids until 4:30 the morning of your surgery.   ?Clear liquids allowed are: Water, Non-Citrus Juices (without pulp), Carbonated Beverages, Clear Tea, Black Coffee ONLY (NO MILK, CREAM OR POWDERED CREAMER of any kind), and Gatorade ?  ? Take these medicines the morning of surgery with A SIP OF WATER:  ?amLODipine (NORVASC)  ?cetirizine (ZYRTEC)  ?norethindrone [facility]) ? ? ?As of today, STOP taking any Aspirin (unless otherwise instructed by your surgeon) Aleve, Naproxen, Ibuprofen, Motrin, Advil, Goody's, BC's, all herbal medications, fish oil, and all vitamins. ? ?         ?Do not wear jewelry or makeup ?Do not wear lotions, powders, perfumes or deodorant. ?Do not shave 48 hours prior to surgery.  ?Do not bring valuables to the hospital. ?Do not wear nail polish, gel polish, artificial nails, or any other type of covering on natural nails (fingers and toes) ?If you have artificial nails or gel coating that need to be removed by a nail salon, please have this removed prior to surgery. Artificial nails or gel coating may interfere with anesthesia's ability to adequately monitor your vital signs. ? ?[facility] is not responsible for any belongings or valuables. .  ? ?Do NOT Smoke (Tobacco/Vaping)  24 hours prior to your procedure ? ?If you use a CPAP at night, you may bring your mask for your overnight stay. ?  ?Contacts, glasses, hearing aids, dentures or partials may not be worn into surgery, please bring cases for these belongings ?  ?For patients admitted to the hospital,  discharge time will be determined by your treatment team. ?  ?Patients discharged the day of surgery will not be allowed to drive home, and someone needs to stay with them for 24 hours. ? ? ?SURGICAL WAITING ROOM VISITATION ?Patients having surgery or a procedure in a hospital may have two support people. ?Children under the age of 46 must have an adult with them who is not the patient. ?They may stay in the waiting area during the procedure and may switch out with other visitors. If the patient needs to stay at the hospital during part of their recovery, the visitor guidelines for inpatient rooms apply. ? ?Please refer to the [place] website for the visitor guidelines for Inpatients (after your surgery is over and you are in a regular room).  ? ? ? ? ? ?Special instructions:   ? ?Oral Hygiene is also important to reduce your risk of infection.  Remember - BRUSH YOUR TEETH THE MORNING OF SURGERY WITH YOUR REGULAR TOOTHPASTE ? ? ?[place]- Preparing For Surgery ? ?Before surgery, you can play an important role. Because skin is not sterile, your skin needs to be as free of germs as possible. You can reduce the number of germs on your skin by washing with CHG (chlorahexidine gluconate) Soap before surgery.  CHG is an antiseptic cleaner which kills germs and bonds with the skin to continue killing germs even after washing.   ? ? ?Please do not use if you have an allergy to  CHG or antibacterial soaps. If your skin becomes reddened/irritated stop using the CHG.  ?Do not shave (including legs and underarms) for at least 48 hours prior to first CHG shower. It is OK to shave your face. ? ?Please follow these instructions carefully. ?  ? ? Shower the NIGHT BEFORE SURGERY and the MORNING OF SURGERY with CHG Soap.  ? If you chose to wash your hair, wash your hair first as usual with your normal shampoo. After you shampoo, rinse your hair and body thoroughly to remove the shampoo.  Then [employer] and genitals (private  parts) with your normal soap and rinse thoroughly to remove soap. ? ?After that Use CHG Soap as you would any other liquid soap. You can apply CHG directly to the skin and wash gently with a scrungie or a clean washcloth.  ? ?Apply the CHG Soap to your body ONLY FROM THE NECK DOWN.  Do not use on open wounds or open sores. Avoid contact with your eyes, ears, mouth and genitals (private parts). Wash Face and genitals (private parts)  with your normal soap.  ? ?Wash thoroughly, paying special attention to the area where your surgery will be performed. ? ?Thoroughly rinse your body with warm water from the neck down. ? ?DO NOT shower/wash with your normal soap after using and rinsing off the CHG Soap. ? ?Pat yourself dry with a CLEAN TOWEL. ? ?Wear CLEAN PAJAMAS to bed the night before surgery ? ?Place CLEAN SHEETS on your bed the night before your surgery ? ?DO NOT SLEEP WITH PETS. ? ? ?Day of Surgery: ? ?Take a shower with CHG soap. ?Wear Clean/Comfortable clothing the morning of surgery ?Do not apply any deodorants/lotions.   ?Remember to brush your teeth WITH YOUR REGULAR TOOTHPASTE. ? ? ? ?If you received a COVID test during your pre-op visit  it is requested that you wear a mask when out in public, stay away from anyone that may not be feeling well and notify your surgeon if you develop symptoms. If you have been in contact with anyone that has tested positive in the last 10 days please notify you surgeon. ? ?  ?Please read over the following fact sheets that you were given.   ?

## 2021-10-21 ENCOUNTER — Encounter (HOSPITAL_COMMUNITY)
Admission: RE | Admit: 2021-10-21 | Discharge: 2021-10-21 | Disposition: A | Discharge status: Home / Self Care | Payer: BC Managed Care – PPO | Source: Ambulatory Visit | Attending: Obstetrics & Gynecology | Admitting: Obstetrics & Gynecology

## 2021-10-21 ENCOUNTER — Other Ambulatory Visit: Payer: Self-pay

## 2021-10-21 ENCOUNTER — Encounter (HOSPITAL_COMMUNITY): Payer: Self-pay

## 2021-10-21 VITALS — BP 136/84 | HR 90 | Temp 98.8°F | Resp 18 | Ht 65.0 in | Wt 318.9 lb

## 2021-10-21 DIAGNOSIS — Z302 Encounter for sterilization: Secondary | ICD-10-CM | POA: Diagnosis not present

## 2021-10-21 DIAGNOSIS — Z01812 Encounter for preprocedural laboratory examination: Secondary | ICD-10-CM | POA: Insufficient documentation

## 2021-10-21 DIAGNOSIS — Q505 Embryonic cyst of broad ligament: Secondary | ICD-10-CM | POA: Diagnosis not present

## 2021-10-21 DIAGNOSIS — I10 Essential (primary) hypertension: Secondary | ICD-10-CM | POA: Diagnosis not present

## 2021-10-21 DIAGNOSIS — Z79899 Other long term (current) drug therapy: Secondary | ICD-10-CM | POA: Diagnosis not present

## 2021-10-21 DIAGNOSIS — R030 Elevated blood-pressure reading, without diagnosis of hypertension: Secondary | ICD-10-CM

## 2021-10-21 LAB — CBC
HCT: 39.7 % (ref 36.0–46.0)
Hemoglobin: 12.6 g/dL (ref 12.0–15.0)
MCH: 26.4 pg (ref 26.0–34.0)
MCHC: 31.7 g/dL (ref 30.0–36.0)
MCV: 83.2 fL (ref 80.0–100.0)
Platelets: 389 10*3/uL (ref 150–400)
RBC: 4.77 MIL/uL (ref 3.87–5.11)
RDW: 14.5 % (ref 11.5–15.5)
WBC: 14.3 10*3/uL — ABNORMAL HIGH (ref 4.0–10.5)
nRBC: 0 % (ref 0.0–0.2)

## 2021-10-21 LAB — NO BLOOD PRODUCTS

## 2021-10-21 LAB — COMPREHENSIVE METABOLIC PANEL
ALT: 16 U/L (ref 0–44)
AST: 17 U/L (ref 15–41)
Albumin: 3.2 g/dL — ABNORMAL LOW (ref 3.5–5.0)
Alkaline Phosphatase: 78 U/L (ref 38–126)
Anion gap: 5 (ref 5–15)
BUN: 16 mg/dL (ref 6–20)
CO2: 25 mmol/L (ref 22–32)
Calcium: 8.8 mg/dL — ABNORMAL LOW (ref 8.9–10.3)
Chloride: 108 mmol/L (ref 98–111)
Creatinine, Ser: 0.87 mg/dL (ref 0.44–1.00)
GFR, Estimated: >60 mL/min (ref >60)
Glucose, Bld: 90 mg/dL (ref 70–99)
Potassium: 3.8 mmol/L (ref 3.5–5.1)
Sodium: 138 mmol/L (ref 135–145)
Total Bilirubin: 0.5 mg/dL (ref 0.3–1.2)
Total Protein: 6.8 g/dL (ref 6.5–8.1)

## 2021-10-21 NOTE — Progress Notes (Signed)
PCP - Abelino Derrick ?Cardiologist - Denies ? ?Chest x-ray - 07/25/21 ?EKG - 07/31/21 ?Stress Test - Denies ?ECHO - Denies ?Cardiac Cath - Denies ? ?Sleep Study - Denies ? ?DM - Denies ? ?Blood Thinner Instructions: Denies ?Aspirin Instructions:Denies ? ? ?Anesthesia review: No ? ?Patient denies shortness of breath, fever, cough and chest pain at PAT appointment ? ? ?All instructions explained to the patient, with a verbal understanding of the material. Patient agrees to go over the instructions while at home for a better understanding.The opportunity to ask questions was provided. ? ? ?

## 2021-10-23 NOTE — Anesthesia Preprocedure Evaluation (Addendum)
Anesthesia Evaluation  ?Patient identified by MRN, date of birth, ID band ?Patient awake ? ? ? ?Reviewed: ?Allergy & Precautions, NPO status , Patient's Chart, lab work & pertinent test results ? ?History of Anesthesia Complications ?Negative for: history of anesthetic complications ? ?Airway ?Mallampati: II ? ?TM Distance: >3 FB ?Neck ROM: Full ? ? ? Dental ? ?(+) Dental Advisory Given ?  ?Pulmonary ?neg pulmonary ROS,  ?  ?breath sounds clear to auscultation ? ? ? ? ? ? Cardiovascular ?hypertension, Pt. on medications ?(-) angina ?Rhythm:Regular Rate:Normal ? ? ?  ?Neuro/Psych ?negative neurological ROS ?   ? GI/Hepatic ?negative GI ROS, Neg liver ROS,   ?Endo/Other  ?Morbid obesityPCOS ? Renal/GU ?negative Renal ROS  ? ?  ?Musculoskeletal ? ? Abdominal ?(+) + obese,   ?Peds ? Hematology ?negative hematology ROS ?(+)   ?Anesthesia Other Findings ? ? Reproductive/Obstetrics ? ?  ? ? ? ? ? ? ? ? ? ? ? ? ? ?  ?  ? ? ? ? ? ? ? ?Anesthesia Physical ?Anesthesia Plan ? ?ASA: 3 ? ?Anesthesia Plan: General  ? ?Post-op Pain Management: Tylenol PO (pre-op)*  ? ?Induction: Intravenous ? ?PONV Risk Score and Plan: 3 and Ondansetron, Dexamethasone and Scopolamine patch - Pre-op ? ?Airway Management Planned: Oral ETT ? ?Additional Equipment: None ? ?Intra-op Plan:  ? ?Post-operative Plan: Extubation in OR ? ?Informed Consent: I have reviewed the patients History and Physical, chart, labs and discussed the procedure including the risks, benefits and alternatives for the proposed anesthesia with the patient or authorized representative who has indicated his/her understanding and acceptance.  ? ? ? ?Dental advisory given ? ?Plan Discussed with: CRNA and Surgeon ? ?Anesthesia Plan Comments:   ? ? ? ? ? ?Anesthesia Quick Evaluation ? ?

## 2021-10-24 ENCOUNTER — Other Ambulatory Visit: Payer: Self-pay

## 2021-10-24 ENCOUNTER — Encounter (HOSPITAL_COMMUNITY): Payer: Self-pay | Admitting: Obstetrics & Gynecology

## 2021-10-24 ENCOUNTER — Encounter (HOSPITAL_COMMUNITY)
Admission: RE | Disposition: A | Discharge status: Home / Self Care | Payer: Self-pay | Source: Home / Self Care | Attending: Obstetrics & Gynecology

## 2021-10-24 ENCOUNTER — Ambulatory Visit (HOSPITAL_COMMUNITY): Payer: BC Managed Care – PPO | Admitting: Physician Assistant

## 2021-10-24 ENCOUNTER — Ambulatory Visit (HOSPITAL_COMMUNITY)
Admission: RE | Admit: 2021-10-24 | Discharge: 2021-10-24 | Disposition: A | Discharge status: Home / Self Care | Payer: BC Managed Care – PPO | Attending: Obstetrics & Gynecology | Admitting: Obstetrics & Gynecology

## 2021-10-24 ENCOUNTER — Ambulatory Visit (HOSPITAL_COMMUNITY): Payer: BC Managed Care – PPO | Admitting: Anesthesiology

## 2021-10-24 DIAGNOSIS — Q505 Embryonic cyst of broad ligament: Secondary | ICD-10-CM | POA: Insufficient documentation

## 2021-10-24 DIAGNOSIS — Z302 Encounter for sterilization: Secondary | ICD-10-CM | POA: Insufficient documentation

## 2021-10-24 DIAGNOSIS — I10 Essential (primary) hypertension: Secondary | ICD-10-CM | POA: Insufficient documentation

## 2021-10-24 DIAGNOSIS — Z13220 Encounter for screening for lipoid disorders: Secondary | ICD-10-CM

## 2021-10-24 DIAGNOSIS — Z79899 Other long term (current) drug therapy: Secondary | ICD-10-CM | POA: Insufficient documentation

## 2021-10-24 DIAGNOSIS — Z6841 Body Mass Index (BMI) 40.0 and over, adult: Secondary | ICD-10-CM | POA: Diagnosis not present

## 2021-10-24 HISTORY — PX: LAPAROSCOPIC BILATERAL SALPINGECTOMY: SHX5889

## 2021-10-24 LAB — POCT PREGNANCY, URINE: Preg Test, Ur: NEGATIVE

## 2021-10-24 SURGERY — SALPINGECTOMY, BILATERAL, LAPAROSCOPIC
Anesthesia: General | Site: Abdomen | Laterality: Bilateral

## 2021-10-24 MED ORDER — SOD CITRATE-CITRIC ACID 500-334 MG/5ML PO SOLN: 30.0000 mL | ORAL | Status: AC

## 2021-10-24 MED ORDER — 0.9 % SODIUM CHLORIDE (POUR BTL) OPTIME
TOPICAL | Status: DC | PRN
  Administered 2021-10-24: 1000 mL

## 2021-10-24 MED ORDER — PROPOFOL 10 MG/ML IV BOLUS
INTRAVENOUS | Status: AC
  Filled 2021-10-24: qty 20

## 2021-10-24 MED ORDER — SCOPOLAMINE 1 MG/3DAYS TD PT72
MEDICATED_PATCH | TRANSDERMAL | Status: AC
  Administered 2021-10-24: 1.5 mg via TRANSDERMAL
  Filled 2021-10-24: qty 1

## 2021-10-24 MED ORDER — BUPIVACAINE HCL (PF) 0.5 % IJ SOLN
INTRAMUSCULAR | Status: AC
  Filled 2021-10-24: qty 30

## 2021-10-24 MED ORDER — FENTANYL CITRATE (PF) 250 MCG/5ML IJ SOLN
INTRAMUSCULAR | Status: DC | PRN
  Administered 2021-10-24: 50 ug via INTRAVENOUS
  Administered 2021-10-24: 100 ug via INTRAVENOUS
  Administered 2021-10-24 (×2): 50 ug via INTRAVENOUS

## 2021-10-24 MED ORDER — IBUPROFEN 800 MG PO TABS: 800.0000 mg | ORAL_TABLET | Freq: Three times a day (TID) | ORAL | 0 refills | Status: DC | PRN

## 2021-10-24 MED ORDER — PROMETHAZINE HCL 25 MG/ML IJ SOLN: 6.2500 mg | INTRAMUSCULAR | Status: DC | PRN

## 2021-10-24 MED ORDER — ONDANSETRON HCL 4 MG/2ML IJ SOLN
INTRAMUSCULAR | Status: DC | PRN
  Administered 2021-10-24: 4 mg via INTRAVENOUS

## 2021-10-24 MED ORDER — MIDAZOLAM HCL 2 MG/2ML IJ SOLN: 0.5000 mg | Freq: Once | INTRAMUSCULAR | Status: DC | PRN

## 2021-10-24 MED ORDER — OXYCODONE HCL 5 MG PO TABS: 5.0000 mg | ORAL_TABLET | Freq: Once | ORAL | Status: DC | PRN

## 2021-10-24 MED ORDER — MIDAZOLAM HCL 2 MG/2ML IJ SOLN
INTRAMUSCULAR | Status: DC | PRN
Start: 2021-10-24 — End: 2021-10-24
  Administered 2021-10-24: 2 mg via INTRAVENOUS

## 2021-10-24 MED ORDER — ONDANSETRON HCL 4 MG/2ML IJ SOLN
INTRAMUSCULAR | Status: AC
  Filled 2021-10-24: qty 2

## 2021-10-24 MED ORDER — SCOPOLAMINE 1 MG/3DAYS TD PT72: 1.0000 | MEDICATED_PATCH | TRANSDERMAL | Status: DC

## 2021-10-24 MED ORDER — OXYCODONE HCL 5 MG/5ML PO SOLN: 5.0000 mg | Freq: Once | ORAL | Status: DC | PRN

## 2021-10-24 MED ORDER — HYDROMORPHONE HCL 1 MG/ML IJ SOLN
INTRAMUSCULAR | Status: AC
  Filled 2021-10-24: qty 1

## 2021-10-24 MED ORDER — SOD CITRATE-CITRIC ACID 500-334 MG/5ML PO SOLN
ORAL | Status: AC
  Administered 2021-10-24: 30 mL via ORAL
  Filled 2021-10-24: qty 30

## 2021-10-24 MED ORDER — MEPERIDINE HCL 25 MG/ML IJ SOLN: 6.2500 mg | INTRAMUSCULAR | Status: DC | PRN

## 2021-10-24 MED ORDER — LACTATED RINGERS IV SOLN: INTRAVENOUS | Status: DC

## 2021-10-24 MED ORDER — ROCURONIUM 10MG/ML (10ML) SYRINGE FOR MEDFUSION PUMP - OPTIME
INTRAVENOUS | Status: DC | PRN
  Administered 2021-10-24: 70 mg via INTRAVENOUS
  Administered 2021-10-24: 30 mg via INTRAVENOUS

## 2021-10-24 MED ORDER — DEXAMETHASONE SODIUM PHOSPHATE 10 MG/ML IJ SOLN
INTRAMUSCULAR | Status: DC | PRN
Start: 2021-10-24 — End: 2021-10-24
  Administered 2021-10-24: 10 mg via INTRAVENOUS

## 2021-10-24 MED ORDER — HYDROMORPHONE HCL 1 MG/ML IJ SOLN
0.2500 mg | INTRAMUSCULAR | Status: DC | PRN
  Administered 2021-10-24: 0.5 mg via INTRAVENOUS

## 2021-10-24 MED ORDER — PROPOFOL 10 MG/ML IV BOLUS
INTRAVENOUS | Status: DC | PRN
Start: 2021-10-24 — End: 2021-10-24
  Administered 2021-10-24: 200 mg via INTRAVENOUS

## 2021-10-24 MED ORDER — BUPIVACAINE HCL (PF) 0.5 % IJ SOLN
INTRAMUSCULAR | Status: DC | PRN
Start: 2021-10-24 — End: 2021-10-24
  Administered 2021-10-24: 30 mL

## 2021-10-24 MED ORDER — SUGAMMADEX SODIUM 200 MG/2ML IV SOLN
INTRAVENOUS | Status: DC | PRN
  Administered 2021-10-24: 500 mg via INTRAVENOUS

## 2021-10-24 MED ORDER — LIDOCAINE HCL (CARDIAC) PF 100 MG/5ML IV SOSY
PREFILLED_SYRINGE | INTRAVENOUS | Status: DC | PRN
  Administered 2021-10-24: 20 mg via INTRAVENOUS

## 2021-10-24 MED ORDER — CHLORHEXIDINE GLUCONATE 0.12 % MT SOLN: 15.0000 mL | Freq: Once | OROMUCOSAL | Status: AC

## 2021-10-24 MED ORDER — SUGAMMADEX SODIUM 500 MG/5ML IV SOLN
INTRAVENOUS | Status: AC
  Filled 2021-10-24: qty 5

## 2021-10-24 MED ORDER — ROCURONIUM BROMIDE 10 MG/ML (PF) SYRINGE
PREFILLED_SYRINGE | INTRAVENOUS | Status: AC
  Filled 2021-10-24: qty 10

## 2021-10-24 MED ORDER — ORAL CARE MOUTH RINSE: 15.0000 mL | Freq: Once | OROMUCOSAL | Status: AC

## 2021-10-24 MED ORDER — FENTANYL CITRATE (PF) 250 MCG/5ML IJ SOLN
INTRAMUSCULAR | Status: AC
  Filled 2021-10-24: qty 5

## 2021-10-24 MED ORDER — DEXAMETHASONE SODIUM PHOSPHATE 10 MG/ML IJ SOLN
INTRAMUSCULAR | Status: AC
  Filled 2021-10-24: qty 1

## 2021-10-24 MED ORDER — LIDOCAINE 2% (20 MG/ML) 5 ML SYRINGE
INTRAMUSCULAR | Status: AC
  Filled 2021-10-24: qty 5

## 2021-10-24 MED ORDER — MIDAZOLAM HCL 2 MG/2ML IJ SOLN
INTRAMUSCULAR | Status: AC
  Filled 2021-10-24: qty 2

## 2021-10-24 MED ORDER — ACETAMINOPHEN 500 MG PO TABS
ORAL_TABLET | ORAL | Status: AC
  Administered 2021-10-24: 1000 mg via ORAL
  Filled 2021-10-24: qty 2

## 2021-10-24 MED ORDER — CHLORHEXIDINE GLUCONATE 0.12 % MT SOLN
OROMUCOSAL | Status: AC
  Administered 2021-10-24: 15 mL via OROMUCOSAL
  Filled 2021-10-24: qty 15

## 2021-10-24 MED ORDER — ACETAMINOPHEN 500 MG PO TABS: 1000.0000 mg | ORAL_TABLET | ORAL | Status: AC

## 2021-10-24 SURGICAL SUPPLY — 35 items
CABLE HIGH FREQUENCY MONO STRZ (ELECTRODE) IMPLANT
CATH ROBINSON RED A/P 16FR (CATHETERS) IMPLANT
DERMABOND ADVANCED (GAUZE/BANDAGES/DRESSINGS) ×1
DERMABOND ADVANCED .7 DNX12 (GAUZE/BANDAGES/DRESSINGS) IMPLANT
DRSG OPSITE POSTOP 3X4 (GAUZE/BANDAGES/DRESSINGS) IMPLANT
DURAPREP 26ML APPLICATOR (WOUND CARE) ×2 IMPLANT
GLOVE SURG ENC MOIS LTX SZ7 (GLOVE) ×4 IMPLANT
GLOVE SURG UNDER POLY LF SZ7 (GLOVE) ×6 IMPLANT
GOWN STRL REUS W/ TWL LRG LVL3 (GOWN DISPOSABLE) ×2 IMPLANT
GOWN STRL REUS W/ TWL XL LVL3 (GOWN DISPOSABLE) ×1 IMPLANT
GOWN STRL REUS W/TWL LRG LVL3 (GOWN DISPOSABLE) ×2
GOWN STRL REUS W/TWL XL LVL3 (GOWN DISPOSABLE) ×1
IRRIG SUCT STRYKERFLOW 2 WTIP (MISCELLANEOUS)
IRRIGATION SUCT STRKRFLW 2 WTP (MISCELLANEOUS) IMPLANT
MANIPULATOR UTERINE 4.5 ZUMI (MISCELLANEOUS) ×2 IMPLANT
NDL INSUFFLATION 14GA 120MM (NEEDLE) ×1 IMPLANT
NEEDLE INSUFFLATION 14GA 120MM (NEEDLE) ×2 IMPLANT
NS IRRIG 1000ML POUR BTL (IV SOLUTION) ×2 IMPLANT
PACK LAPAROSCOPY BASIN (CUSTOM PROCEDURE TRAY) ×2 IMPLANT
PACK TRENDGUARD 450 HYBRID PRO (MISCELLANEOUS) IMPLANT
POUCH SPECIMEN RETRIEVAL 10MM (ENDOMECHANICALS) IMPLANT
PROTECTOR NERVE ULNAR (MISCELLANEOUS) ×4 IMPLANT
SET TUBE SMOKE EVAC HIGH FLOW (TUBING) ×2 IMPLANT
SHEARS HARMONIC ACE PLUS 36CM (ENDOMECHANICALS) ×2 IMPLANT
SLEEVE XCEL OPT CAN 5 100 (ENDOMECHANICALS) IMPLANT
SUT VICRYL 0 UR6 27IN ABS (SUTURE) IMPLANT
SUT VICRYL 4-0 PS2 18IN ABS (SUTURE) ×3 IMPLANT
SYR 10ML LL (SYRINGE) ×2 IMPLANT
SYR 5ML LL (SYRINGE) ×2 IMPLANT
TOWEL GREEN STERILE FF (TOWEL DISPOSABLE) ×4 IMPLANT
TRAY FOLEY W/BAG SLVR 14FR (SET/KITS/TRAYS/PACK) IMPLANT
TRENDGUARD 450 HYBRID PRO PACK (MISCELLANEOUS) ×2
TROCAR OPTI TIP 5M 100M (ENDOMECHANICALS) ×2 IMPLANT
TROCAR XCEL DIL TIP R 11M (ENDOMECHANICALS) ×2 IMPLANT
WARMER LAPAROSCOPE (MISCELLANEOUS) ×2 IMPLANT

## 2021-10-24 NOTE — Op Note (Signed)
10/24/2021 ? ?8:54 AM ? ?PATIENT:  Courtney Mooney  36 y.o. female ? ?PRE-OPERATIVE DIAGNOSIS:  Undesired Fertility ? ?POST-OPERATIVE DIAGNOSIS:  Undesired Fertility ? ?PROCEDURE:  Procedure(s): ?LAPAROSCOPIC BILATERAL SALPINGECTOMY (Bilateral) ? ?SURGEON:  Surgeon(s) and Role: ?   Lavonia Drafts, MD - Primary ? ?ANESTHESIA:   local and general ? ?EBL:  10 mL  ? ?BLOOD ADMINISTERED:none ? ?DRAINS: none  ? ?LOCAL MEDICATIONS USED:  MARCAINE    ? ?SPECIMEN:  Source of Specimen:  bilateral fallopian tubes ? ?DISPOSITION OF SPECIMEN:  PATHOLOGY ? ?COUNTS:  YES ? ?TOURNIQUET:  * No tourniquets in log * ? ?DICTATION: .Note written in EPIC ? ?PLAN OF CARE: Discharge to home after PACU ? ?PATIENT DISPOSITION:  PACU - hemodynamically stable. ?  ?Delay start of Pharmacological VTE agent (>24hrs) due to surgical blood loss or risk of bleeding: not applicable ? ?Complications: none immediate . ? ?IINDICATIONS: 36 y.o. [id number] with undesired fertility, desires permanent sterilization. Other reversible forms of contraception were discussed with patient; she declines all other modalities.  Risks of procedure discussed with patient including permanence of method, risk of regret, bleeding, infection, injury to surrounding organs and need for additional procedures including laparotomy.  Failure risk less than 0.5% with increased risk of ectopic gestation if pregnancy occurs was also discussed with patient.  Written informed consent was obtained. ?   ?FINDINGS:  Normal uterus, fallopian tubes, and ovaries.The left ovary contained a small simple cyst. There is an adhesion of omentum attached to the ant abd wall in the midline.  ? ?TECHNIQUE:  The patient was taken to the operating room where general anesthesia was obtained without difficulty.  She was then placed in the dorsal lithotomy position and prepared and draped in sterile fashion.  After an adequate timeout was performed, a bivalved speculum was then placed in the  patient's vagina, and the anterior lip of cervix grasped with the single-tooth tenaculum.  The uterine manipulator was then advanced into the uterus.  A foley was placed and attached to gravity drainage. The speculum was removed from the vagina. ? ?Attention was then turned to the patient's abdomen where a 5-mm skin incision was made in the left uppper quadrant.  The 5-mm trocar and sleeve were then advanced without difficulty with the laparoscope under direct visualization into the abdomen.  The abdomen was then insufflated with carbon dioxide gas.  Adequate pneumoperitoneum was obtained.  A survey of the patient's pelvis and abdomen revealed the findings above.  Two 5-mm lower quadrant ports were placed on the right side of the pts abd under direct visualization.  The fallopian tubes were transected from the uterine attachments and the underlying mesosalpinx with the Harmonic device allowing for bilateral salpingectomy.  The fallopian tubes were then removed from the abdomen under direct visualization.  The operative site was surveyed, and it was found to be hemostatic.   No intraoperative injury to other surrounding organs was noted.  The abdomen was desufflated and all instruments were then removed from the patient's abdomen.  All skin incisions were closed with 3-0 vicryl and covered with Dermabond.  The uterine manipulator was removed from the vagina without complications. The patient tolerated the procedure well.  Sponge, lap, and needle counts were correct times two.  The patient was then taken to the recovery room awake, extubated and in stable condition. ? ?The patient will be discharged to home as per PACU criteria.  Routine postoperative instructions given.  She will follow up in the office  in 4 weeks for postoperative evaluation. ? ?Dr. Ihor Dow  ? ? ? ? ?  ?

## 2021-10-24 NOTE — Brief Op Note (Signed)
10/24/2021 ? ?8:54 AM ? ?PATIENT:  Courtney Mooney  36 y.o. female ? ?PRE-OPERATIVE DIAGNOSIS:  Undesired Fertility ? ?POST-OPERATIVE DIAGNOSIS:  Undesired Fertility ? ?PROCEDURE:  Procedure(s): ?LAPAROSCOPIC BILATERAL SALPINGECTOMY (Bilateral) ? ?SURGEON:  Surgeon(s) and Role: ?   Lavonia Drafts, MD - Primary ? ?ANESTHESIA:   local and general ? ?EBL:  10 mL  ? ?BLOOD ADMINISTERED:none ? ?DRAINS: none  ? ?LOCAL MEDICATIONS USED:  MARCAINE    ? ?SPECIMEN:  Source of Specimen:  bilateral fallopian tubes ? ?DISPOSITION OF SPECIMEN:  PATHOLOGY ? ?COUNTS:  YES ? ?TOURNIQUET:  * No tourniquets in log * ? ?DICTATION: .Note written in EPIC ? ?PLAN OF CARE: Discharge to home after PACU ? ?PATIENT DISPOSITION:  PACU - hemodynamically stable. ?  ?Delay start of Pharmacological VTE agent (>24hrs) due to surgical blood loss or risk of bleeding: not applicable ? ?Complications: none immediate . ? ?Torris House L. Harraway-Smith, M.D., [place] ? ? ?

## 2021-10-24 NOTE — Anesthesia Postprocedure Evaluation (Signed)
Anesthesia Post Note ? ?Patient: Courtney Mooney ? ?Procedure(s) Performed: LAPAROSCOPIC BILATERAL SALPINGECTOMY (Bilateral: Abdomen) ? ?  ? ?Patient location during evaluation: PACU ?Anesthesia Type: General ?Level of consciousness: awake and alert, patient cooperative and oriented ?Pain management: pain level controlled ?Vital Signs Assessment: post-procedure vital signs reviewed and stable ?Respiratory status: spontaneous breathing, nonlabored ventilation and respiratory function stable ?Cardiovascular status: blood pressure returned to baseline and stable ?Postop Assessment: no apparent nausea or vomiting and adequate PO intake ?Anesthetic complications: no ? ? ?No notable events documented. ? ?Last Vitals:  ?Vitals:  ? 10/24/21 0850 10/24/21 0905  ?BP: (!) 166/100 (!) 151/85  ?Pulse: 86 87  ?Resp: 18 18  ?Temp:    ?SpO2: 100% 100%  ?  ?Last Pain:  ?Vitals:  ? 10/24/21 0624  ?TempSrc: Oral  ?PainSc:   ? ? ?  ?  ?  ?  ?  ?  ? ?Emauri Krygier,E. Nazario Russom ? ? ? ? ?
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
## 2021-10-24 NOTE — Anesthesia Procedure Notes (Signed)
Procedure Name: Intubation ?Date/Time: 10/24/2021 7:40 AM ?Performed by: Lorie Phenix, CRNA ?Pre-anesthesia Checklist: Patient identified, Emergency Drugs available, Suction available and Patient being monitored ?Patient Re-evaluated:Patient Re-evaluated prior to induction ?Oxygen Delivery Method: Circle system utilized ?Preoxygenation: Pre-oxygenation with 100% oxygen ?Induction Type: IV induction ?Ventilation: Mask ventilation without difficulty ?Laryngoscope Size: Mac and 3 ?Grade View: Grade I ?Tube type: Oral ?Tube size: 7.5 mm ?Number of attempts: 1 ?Airway Equipment and Method: Stylet ?Placement Confirmation: ETT inserted through vocal cords under direct vision, positive ETCO2 and breath sounds checked- equal and bilateral ?Secured at: 22 cm ?Tube secured with: Tape ?Dental Injury: Teeth and Oropharynx as per pre-operative assessment  ? ? ? ? ?

## 2021-10-24 NOTE — Transfer of Care (Signed)
Immediate Anesthesia Transfer of Care Note ? ?Patient: Courtney Mooney ? ?Procedure(s) Performed: LAPAROSCOPIC BILATERAL SALPINGECTOMY (Bilateral: Abdomen) ? ?Patient Location: PACU ? ?Anesthesia Type:General ? ?Level of Consciousness: awake and alert  ? ?Airway & Oxygen Therapy: Patient Spontanous Breathing ? ?Post-op Assessment: Report given to RN and Post -op Vital signs reviewed and stable ? ?Post vital signs: Reviewed and stable ? ?Last Vitals:  ?Vitals Value Taken Time  ?BP 160/103   ?Temp    ?Pulse 99   ?Resp 14   ?SpO2 100   ? ? ?Last Pain:  ?Vitals:  ? 10/24/21 0624  ?TempSrc: Oral  ?PainSc:   ?   ? ?  ? ?Complications: No notable events documented. ?

## 2021-10-25 ENCOUNTER — Encounter (HOSPITAL_COMMUNITY): Payer: Self-pay | Admitting: Obstetrics & Gynecology

## 2021-10-25 LAB — SURGICAL PATHOLOGY

## 2021-10-26 ENCOUNTER — Other Ambulatory Visit: Payer: Self-pay | Admitting: Family Medicine

## 2021-10-26 DIAGNOSIS — I10 Essential (primary) hypertension: Secondary | ICD-10-CM

## 2021-11-18 ENCOUNTER — Emergency Department (HOSPITAL_COMMUNITY)
Admission: EM | Admit: 2021-11-18 | Discharge: 2021-11-18 | Disposition: A | Discharge status: Home / Self Care | Payer: BC Managed Care – PPO | Attending: Emergency Medicine | Admitting: Emergency Medicine

## 2021-11-18 ENCOUNTER — Encounter (HOSPITAL_COMMUNITY): Payer: Self-pay

## 2021-11-18 ENCOUNTER — Other Ambulatory Visit: Payer: Self-pay

## 2021-11-18 DIAGNOSIS — Z79899 Other long term (current) drug therapy: Secondary | ICD-10-CM | POA: Insufficient documentation

## 2021-11-18 DIAGNOSIS — R42 Dizziness and giddiness: Secondary | ICD-10-CM | POA: Diagnosis not present

## 2021-11-18 DIAGNOSIS — I10 Essential (primary) hypertension: Secondary | ICD-10-CM | POA: Diagnosis not present

## 2021-11-18 LAB — CBC
HCT: 41.1 % (ref 36.0–46.0)
Hemoglobin: 12.7 g/dL (ref 12.0–15.0)
MCH: 26 pg (ref 26.0–34.0)
MCHC: 30.9 g/dL (ref 30.0–36.0)
MCV: 84.2 fL (ref 80.0–100.0)
Platelets: 379 10*3/uL (ref 150–400)
RBC: 4.88 MIL/uL (ref 3.87–5.11)
RDW: 14.6 % (ref 11.5–15.5)
WBC: 14.1 10*3/uL — ABNORMAL HIGH (ref 4.0–10.5)
nRBC: 0 % (ref 0.0–0.2)

## 2021-11-18 LAB — BASIC METABOLIC PANEL
Anion gap: 7 (ref 5–15)
BUN: 17 mg/dL (ref 6–20)
CO2: 26 mmol/L (ref 22–32)
Calcium: 9.1 mg/dL (ref 8.9–10.3)
Chloride: 106 mmol/L (ref 98–111)
Creatinine, Ser: 0.86 mg/dL (ref 0.44–1.00)
GFR, Estimated: >60 mL/min (ref >60)
Glucose, Bld: 101 mg/dL — ABNORMAL HIGH (ref 70–99)
Potassium: 3.8 mmol/L (ref 3.5–5.1)
Sodium: 139 mmol/L (ref 135–145)

## 2021-11-18 LAB — URINALYSIS, ROUTINE W REFLEX MICROSCOPIC
Bilirubin Urine: NEGATIVE
Glucose, UA: NEGATIVE mg/dL
Hgb urine dipstick: NEGATIVE
Ketones, ur: NEGATIVE mg/dL
Leukocytes,Ua: NEGATIVE
Nitrite: NEGATIVE
Protein, ur: NEGATIVE mg/dL
Specific Gravity, Urine: 1.011 (ref 1.005–1.030)
pH: 7 (ref 5.0–8.0)

## 2021-11-18 MED ORDER — AMLODIPINE BESYLATE 5 MG PO TABS: 10.0000 mg | ORAL_TABLET | Freq: Every day | ORAL | 0 refills | Status: DC

## 2021-11-18 NOTE — ED Triage Notes (Addendum)
Patient reports that she had a sudden onset of dizziness, having a slight headache, and feeling bad today after lunch. ? ?Patient's BP in triage 160/116. Patient is currently taking Amlodipine and states she took hr medication this morning. ?

## 2021-11-18 NOTE — ED Provider Notes (Signed)
?[facility] DEPT ?Provider Note ? ? ?CSN: [id number] ?Arrival date & time: 11/18/21  1248 ? ?  ? ?History ? ?Chief Complaint  ?Patient presents with  ? Hypertension  ? Dizziness  ? ? ?Courtney Mooney is a 36 y.o. female. ? ? ?Hypertension ?Associated symptoms include headaches. Pertinent negatives include no chest pain, no abdominal pain and no shortness of breath.  ?Dizziness ?Associated symptoms: headaches   ?Associated symptoms: no chest pain and no shortness of breath   ?Patient presents with dizziness.  States that she had a dull headache.  States that she felt as if she could pass out.  States blood pressure was elevated.  Has a history of hypertension and is on Norvasc for it.  Also was on some supplements.  No nausea vomiting diarrhea.  No confusion.  States he just felt bad.  No chest pain.  Headaches improved.  No lateralizing numbness or weakness. ?  ? ?Home Medications ?Prior to Admission medications   ?Medication Sig Start Date End Date Taking? Authorizing Provider  ?amLODipine (NORVASC) 5 MG tablet Take 2 tablets (10 mg total) by mouth daily. 11/18/21   Davonna Belling, MD  ?ASHWAGANDHA PO Take 1 capsule by mouth daily.    [provider]  ?cetirizine (ZYRTEC) 10 MG tablet Take 10 mg by mouth daily.    [provider]  ?ibuprofen (ADVIL) 800 MG tablet Take 1 tablet (800 mg total) by mouth every 8 (eight) hours as needed. 10/24/21   Lavonia Drafts, MD  ?Multiple Vitamin (MULTIVITAMIN) capsule Take 1 capsule by mouth daily.    [provider]  ?Vitamin D, Ergocalciferol, (DRISDOL) 1.25 MG (50000 UNIT) CAPS capsule Take 1 capsule (50,000 Units total) by mouth every 7 (seven) days. ?Patient taking differently: Take 50,000 Units by mouth every Friday. 06/24/21   Libby Maw, MD  ?   ? ?Allergies    ?Patient has no known allergies.   ? ?Review of Systems   ?Review of Systems  ?Constitutional:  Negative for appetite change.   ?Respiratory:  Negative for shortness of breath.   ?Cardiovascular:  Negative for chest pain.  ?Gastrointestinal:  Negative for abdominal pain.  ?Musculoskeletal:  Negative for back pain.  ?Neurological:  Positive for dizziness and headaches.  ? ?Physical Exam ?Updated Vital Signs ?BP (!) 158/104   Pulse 78   Temp 98.6 ?F (37 ?C) (Oral)   Resp (!) 24   Ht '5\' 5"'$  (1.651 m)   Wt (!) 143.8 kg   LMP 09/25/2021 (Approximate)   SpO2 100%   BMI 52.75 kg/m?  ?Physical Exam ?Vitals and nursing note reviewed.  ?HENT:  ?   Head: Atraumatic.  ?Eyes:  ?   Pupils: Pupils are equal, round, and reactive to light.  ?Cardiovascular:  ?   Rate and Rhythm: Regular rhythm.  ?Pulmonary:  ?   Breath sounds: No wheezing.  ?Abdominal:  ?   Tenderness: There is no abdominal tenderness.  ?Musculoskeletal:  ?   Cervical back: Neck supple.  ?Neurological:  ?   Mental Status: She is alert and oriented to person, place, and time.  ?   Comments: No numbness or weakness.  No confusion.  No nystagmus.  ? ? ?ED Results / Procedures / Treatments   ?Labs ?(all labs ordered are listed, but only abnormal results are displayed) ?Labs Reviewed  ?CBC - Abnormal; Notable for the following components:  ?    Result Value  ? WBC 14.1 (*)   ? All other  components within normal limits  ?BASIC METABOLIC PANEL - Abnormal; Notable for the following components:  ? Glucose, Bld 101 (*)   ? All other components within normal limits  ?URINALYSIS, ROUTINE W REFLEX MICROSCOPIC - Abnormal; Notable for the following components:  ? Color, Urine STRAW (*)   ? All other components within normal limits  ? ? ?EKG ?EKG Interpretation ? ?Date/Time:  Monday November 18 2021 17:22:07 EDT ?Ventricular Rate:  94 ?PR Interval:  157 ?QRS Duration: 76 ?QT Interval:  352 ?QTC Calculation: 441 ?R Axis:   77 ?Text Interpretation: Sinus rhythm Confirmed by Davonna Belling [phone]) on 11/18/2021 5:42:17 PM ? ?Radiology ?No results found. ? ?Procedures ?Procedures  ? ? ?Medications  Ordered in ED ?Medications - No data to display ? ?ED Course/ Medical Decision Making/ A&P ?  ?                        ?Medical Decision Making ?Risk ?Prescription drug management. ? ? ?Patient presented with headache dizziness.  Dizziness is more as if she was in a pass out.  No chest pain.  No numbness weakness.  No confusion.  Did not feel as if the room was spinning.  Does tend to get sinus headaches states this felt similar to that.  Found to have elevated blood pressure.  Does have a history of hypertension.  Does feel little off and her blood pressure goes up.  Work-up here reassuring.  EKG reassuring.  White count is elevated but otherwise reassuring blood work.  EKG does not show ischemia.  Does not appear to need head imaging at this time with no neurodeficits.  Blood pressure has come down some but still somewhat elevated.  Will increase her Norvasc to 10 mg from 5 mg.  Doubt severe hypertension that needs admission.  Appears stable for discharge home with outpatient follow-up.   ?Reviewed notes from recent surgery. ? ? ? ? ? ? ? ?Final Clinical Impression(s) / ED Diagnoses ?Final diagnoses:  ?Dizziness  ?Hypertension, unspecified type  ? ? ?Rx / DC Orders ?ED Discharge Orders   ? ?      Ordered  ?  amLODipine (NORVASC) 5 MG tablet  Daily       ? 11/18/21 1839  ? ?  ?  ? ?  ? ? ?  ?Davonna Belling, MD ?11/18/21 1843 ? ?

## 2021-11-18 NOTE — Discharge Instructions (Signed)
Your work-up was reassuring.  The blood pressure was somewhat elevated.  Has come down and you are feeling better.  Follow-up with your doctor.  I have increased her blood pressure medicine to 10 mg a day.  It appears that the blood pressures been elevated the last several times it has been checked. ?

## 2021-11-18 NOTE — ED Provider Triage Note (Signed)
Emergency Medicine Provider Triage Evaluation Note ? ?Courtney Mooney , a 36 y.o. female  was evaluated in triage.  Pt complains of hypertension.  Patient states that today at work she had an episode of dizziness as well as headache, had nurse check her blood pressure and it was elevated.  Patient states that she takes 5 mg and loaded pain, she reports that she is compliant on this medication.  Patient denies any chest pain, shortness of breath, blurred vision.  Patient denies back pain. ? ?Review of Systems  ?Positive:  ?Negative:  ? ?Physical Exam  ?BP (!) 160/116 (BP Location: Left Arm)   Pulse (!) 105   Temp 98.8 ?F (37.1 ?C) (Oral)   Resp 18   Ht '5\' 5"'$  (1.651 m)   Wt (!) 143.8 kg   LMP 09/25/2021 (Approximate)   SpO2 100%   BMI 52.75 kg/m?  ?Gen:   Awake, no distress   ?Resp:  Normal effort  ?MSK:   Moves extremities without difficulty  ?Other:  No focal neurodeficits on examination. ? ?Medical Decision Making  ?Medically screening exam initiated at 1:46 PM.  Appropriate orders placed.  Courtney Mooney was informed that the remainder of the evaluation will be completed by another provider, this initial triage assessment does not replace that evaluation, and the importance of remaining in the ED until their evaluation is complete. ? ? ?  ?Azucena Cecil, PA-C ?11/18/21 1347 ? ?

## 2021-11-20 ENCOUNTER — Encounter: Payer: Self-pay | Admitting: Obstetrics & Gynecology

## 2021-11-20 ENCOUNTER — Ambulatory Visit (INDEPENDENT_AMBULATORY_CARE_PROVIDER_SITE_OTHER): Payer: BC Managed Care – PPO | Admitting: Obstetrics & Gynecology

## 2021-11-20 VITALS — BP 133/67 | HR 79 | Wt 318.0 lb

## 2021-11-20 DIAGNOSIS — Z9889 Other specified postprocedural states: Secondary | ICD-10-CM

## 2021-11-20 NOTE — Progress Notes (Signed)
History:  ?36 y.o.LMP here today for 4 week post op check.Pt is s/p lap bilateral salpingectomy on 10/24/2021.  Pt reports that she is doing well. She is eating and passing stolos without difficulty.   She returned to work after 6 days with no issues.  ? ?The following portions of the patient's history were reviewed and updated as appropriate: allergies, current medications, past family history, past medical history, past social history, past surgical history and problem list. ? ?Review of Systems:  ?Pertinent items are noted in HPI. ?   ?Objective:  ?Physical Exam ?BP 133/67   Pulse 79   Wt (!) 318 lb (144.2 kg)   LMP 10/26/2021   BMI 52.92 kg/m?  ? ?CONSTITUTIONAL: Well-developed, well-nourished female in no acute distress.  ?HENT:  Normocephalic, atraumatic ?EYES: Conjunctivae and EOM are normal. No scleral icterus.  ?NECK: Normal range of motion ?SKIN: Skin is warm and dry. No rash noted. Not diaphoretic.No pallor. ?[place]: Alert and oriented to person, place, and time. Normal coordination.  ?Abd: Soft, nontender and nondistended; her port sites are healing well. Marland Kitchen  ?Pelvic: deferred ? ?Labs and Imaging ?Surg path 10/24/2021 ?FINAL MICROSCOPIC DIAGNOSIS:  ? ?A. 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  ?Negative for endometriosis and neoplasm.  ? ?Assessment & Plan:  ?4 week post op check following lap bilateral salpingectomy.  ? Doing well ? Reviewed her surg path.  ? Reviewed post op instructions and activities ? F/u in 1 year for annual or sooner prn ? All questions answered.  ? ?Jesson Foskey L. Harraway-Smith, M.D., [place]  ?

## 2021-11-21 ENCOUNTER — Encounter: Payer: Self-pay | Admitting: Family Medicine

## 2021-11-27 ENCOUNTER — Ambulatory Visit: Payer: BC Managed Care – PPO | Admitting: Family Medicine

## 2021-11-27 ENCOUNTER — Encounter: Payer: Self-pay | Admitting: Family Medicine

## 2021-11-27 VITALS — BP 138/82 | HR 81 | Temp 97.3°F | Ht 65.0 in | Wt 316.4 lb

## 2021-11-27 DIAGNOSIS — M7552 Bursitis of left shoulder: Secondary | ICD-10-CM | POA: Diagnosis not present

## 2021-11-27 DIAGNOSIS — I10 Essential (primary) hypertension: Secondary | ICD-10-CM

## 2021-11-27 LAB — CBC
HCT: 40.1 % (ref 36.0–46.0)
Hemoglobin: 12.6 g/dL (ref 12.0–15.0)
MCHC: 31.5 g/dL (ref 30.0–36.0)
MCV: 82.7 fl (ref 78.0–100.0)
Platelets: 418 10*3/uL — ABNORMAL HIGH (ref 150.0–400.0)
RBC: 4.85 Mil/uL (ref 3.87–5.11)
RDW: 14.5 % (ref 11.5–15.5)
WBC: 13.9 10*3/uL — ABNORMAL HIGH (ref 4.0–10.5)

## 2021-11-27 LAB — BASIC METABOLIC PANEL
BUN: 14 mg/dL (ref 6–23)
CO2: 30 mEq/L (ref 19–32)
Calcium: 9.1 mg/dL (ref 8.4–10.5)
Chloride: 102 mEq/L (ref 96–112)
Creatinine, Ser: 0.87 mg/dL (ref 0.40–1.20)
GFR: 84.96 mL/min (ref >60.00)
Glucose, Bld: 96 mg/dL (ref 70–99)
Potassium: 3.8 mEq/L (ref 3.5–5.1)
Sodium: 138 mEq/L (ref 135–145)

## 2021-11-27 MED ORDER — DICLOFENAC SODIUM 1 % EX GEL: CUTANEOUS | 1 refills | Status: DC

## 2021-11-27 MED ORDER — AMLODIPINE BESYLATE 5 MG PO TABS: 5.0000 mg | ORAL_TABLET | Freq: Every day | ORAL | 1 refills | Status: DC

## 2021-11-27 MED ORDER — CHLORTHALIDONE 25 MG PO TABS: 25.0000 mg | ORAL_TABLET | Freq: Every day | ORAL | 1 refills | Status: DC

## 2021-11-27 NOTE — Progress Notes (Signed)
? ?Established Patient Office Visit ? ?Subjective   ?Patient ID: Courtney Mooney, female    DOB: 1986-03-15  Age: 36 y.o. MRN: [id number] ? ?Chief Complaint  ?Patient presents with  ? Follow-up  ?  Hospital F/u for BP. Pain in left shoulder for 2 weeks.  ? ? ?HPI presents for hospital follow-up status post increased blood pressure symptomatic with lightheadedness dizziness and a headache.  She has been taking ibuprofen after her surgery before this visit.  Amlodipine was increased to 10 mg.  She is here on follow-up.  Has been taking the 10 mg about a week now.  She has noticed some flushing and increased swelling in her lower extremities.  She has been checking her blood pressures and they are averaging in the 140s over 80s-90s.  She reports left shoulder pain that radiates down her arm and up to her neck.  There is been no shortness of breath nausea or chest pain at this. right-hand-dominant.  ? ? ? ?Review of Systems  ?Constitutional:  Negative for chills, diaphoresis, malaise/fatigue and weight loss.  ?HENT: Negative.    ?Eyes: Negative.  Negative for blurred vision and double vision.  ?Cardiovascular:  Negative for chest pain.  ?Gastrointestinal:  Negative for abdominal pain.  ?Genitourinary: Negative.   ?Musculoskeletal:  Positive for joint pain. Negative for falls, myalgias and neck pain.  ?Neurological:  Negative for dizziness, tingling, speech change, loss of consciousness, weakness and headaches.  ?Psychiatric/Behavioral: Negative.    ? ?  ?Objective:  ?  ? ?BP 138/82 (BP Location: Right Arm, Patient Position: Sitting, Cuff Size: Normal)   Pulse 81   Temp (!) 97.3 ?F (36.3 ?C) (Temporal)   Ht '5\' 5"'$  (1.651 m)   Wt (!) 316 lb 6.4 oz (143.5 kg)   SpO2 99%   BMI 52.65 kg/m?  ?BP Readings from Last 3 Encounters:  ?11/27/21 138/82  ?11/20/21 133/67  ?11/18/21 (!) 149/104  ? ?Wt Readings from Last 3 Encounters:  ?11/27/21 (!) 316 lb 6.4 oz (143.5 kg)  ?11/20/21 (!) 318 lb (144.2 kg)  ?11/18/21 (!) 317 lb  (143.8 kg)  ? ?  ? ?Physical Exam ?Constitutional:   ?   General: She is not in acute distress. ?   Appearance: Normal appearance. She is not ill-appearing, toxic-appearing or diaphoretic.  ?HENT:  ?   Head: Normocephalic and atraumatic.  ?   Right Ear: External ear normal.  ?   Left Ear: External ear normal.  ?   Mouth/Throat:  ?   Mouth: Mucous membranes are moist.  ?   Pharynx: Oropharynx is clear. No oropharyngeal exudate or posterior oropharyngeal erythema.  ?Eyes:  ?   General: No scleral icterus.    ?   Right eye: No discharge.     ?   Left eye: No discharge.  ?   Extraocular Movements: Extraocular movements intact.  ?   Conjunctiva/sclera: Conjunctivae normal.  ?   Pupils: Pupils are equal, round, and reactive to light.  ?Cardiovascular:  ?   Rate and Rhythm: Normal rate and regular rhythm.  ?Pulmonary:  ?   Effort: Pulmonary effort is normal. No respiratory distress.  ?   Breath sounds: Normal breath sounds.  ?Abdominal:  ?   General: Bowel sounds are normal.  ?   Tenderness: There is no abdominal tenderness. There is no guarding.  ?Musculoskeletal:  ?   Left shoulder: Tenderness and bony tenderness present. Normal range of motion.  ?     Arms: ? ?  Cervical back: No rigidity, tenderness or bony tenderness. No pain with movement. Normal range of motion.  ?     Back: ? ?     Legs: ? ?Skin: ?   General: Skin is warm and dry.  ?Neurological:  ?   Mental Status: She is alert and oriented to person, place, and time.  ?Psychiatric:     ?   Mood and Affect: Mood normal.     ?   Behavior: Behavior normal.  ? ? ? ?No results found for any visits on 11/27/21. ? ? ? ?The ASCVD Risk score (Arnett DK, et al., 2019) failed to calculate for the following reasons: ?  The 2019 ASCVD risk score is only valid for ages 53 to 11 ? ?  ?Assessment & Plan:  ? ?Problem List Items Addressed This Visit   ? ?  ? Cardiovascular and Mediastinum  ? Essential hypertension - Primary  ? Relevant Medications  ? amLODipine (NORVASC) 5 MG  tablet  ? chlorthalidone (HYGROTON) 25 MG tablet  ? Other Relevant Orders  ? Basic metabolic panel  ? CBC  ?  ? Musculoskeletal and Integument  ? Bursitis of left shoulder  ? Relevant Medications  ? diclofenac Sodium (VOLTAREN) 1 % GEL  ? ? ?Return in about 2 months (around 01/27/2022).  ?While it takes 4 to 6 weeks for the blood pressure to entirely respond to amlodipine, she is having symptoms with the increased dose of flushing lower extremity edema.  Restart 5 mg dose and add chlorthalidone daily.  She will continue to check and record her blood pressures.  Voltaren gel to tender spot in shoulder.  Information given on managing hypertension and bursitis. ? ?Libby Maw, MD ? ?

## 2021-11-28 ENCOUNTER — Inpatient Hospital Stay: Payer: BC Managed Care – PPO | Admitting: Family Medicine

## 2022-01-09 IMAGING — DX DG CHEST 2V
2 series · 2 of 2 positions shown · non-contrast
Comparison: No priors.

CLINICAL DATA: 33-year-old female with history of morbid obesity.

EXAM:
CHEST - 2 VIEW

[chest pa]
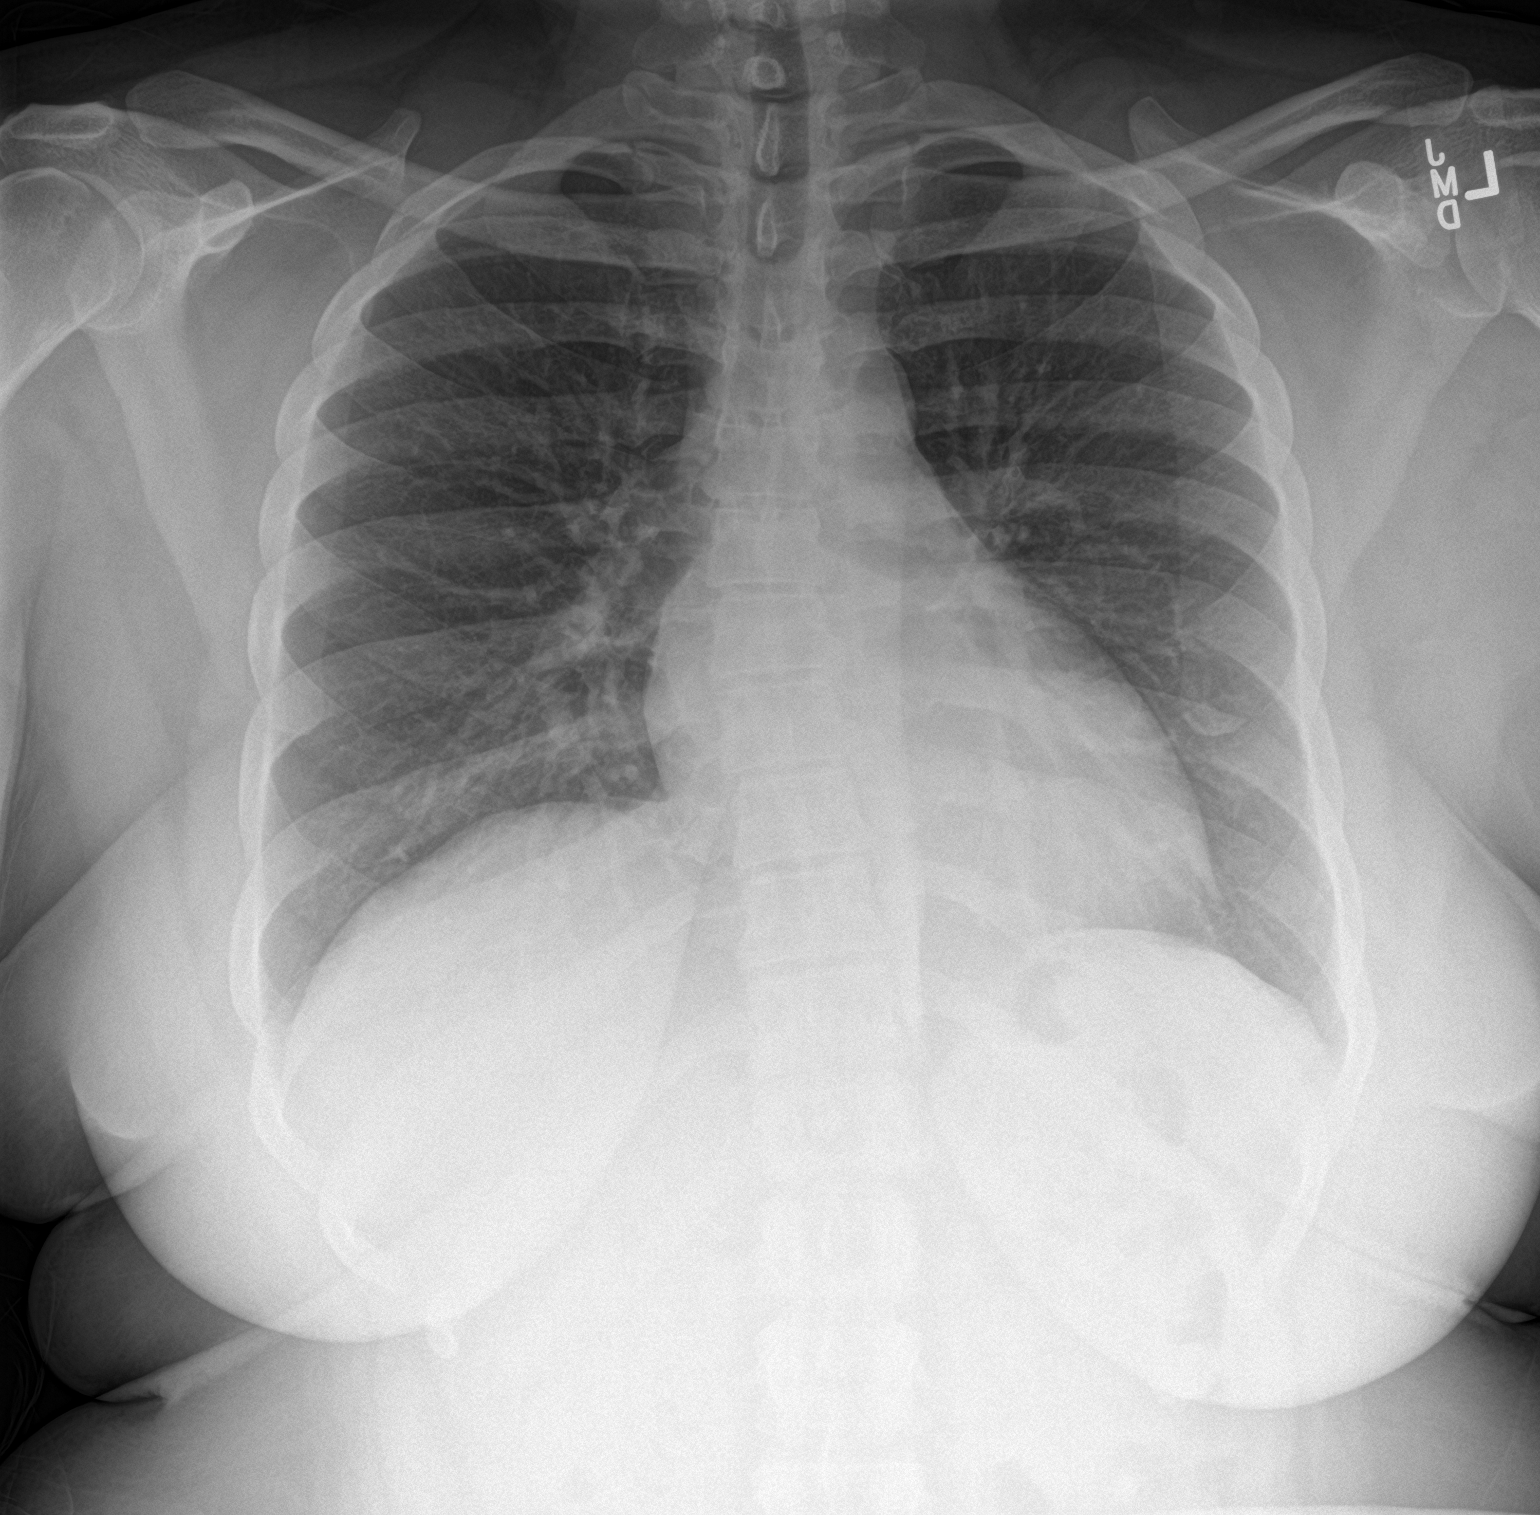

[chest lat]
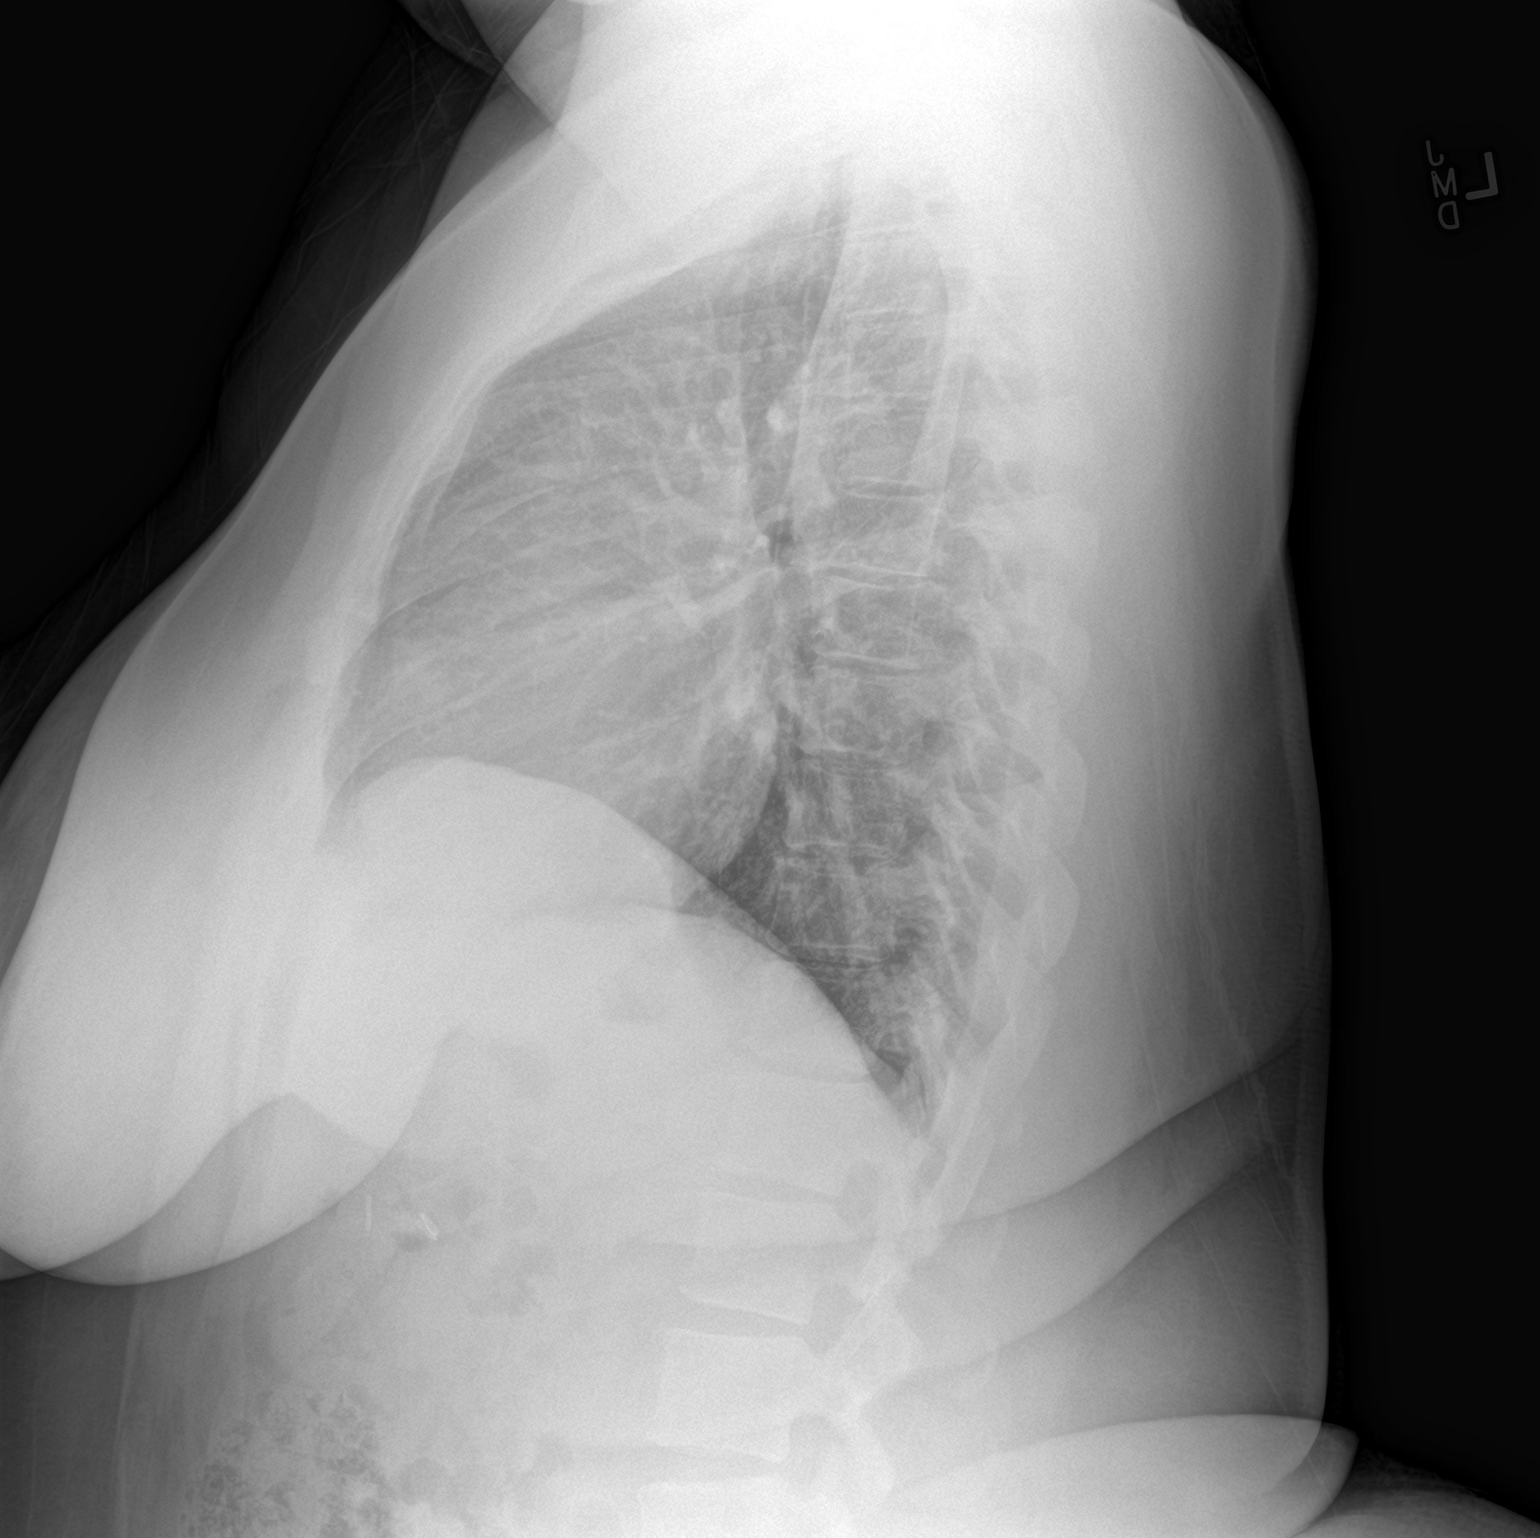

[2 of 2 positions shown; findings below may reference images not displayed]

FINDINGS: Lung volumes are normal. No consolidative airspace disease. No
pleural effusions. No pneumothorax. No pulmonary nodule or mass
noted. Pulmonary vasculature and the cardiomediastinal silhouette
are within normal limits.
IMPRESSION: No radiographic evidence of acute cardiopulmonary disease.

## 2022-01-27 ENCOUNTER — Ambulatory Visit: Payer: BC Managed Care – PPO | Admitting: Family Medicine

## 2022-01-27 ENCOUNTER — Encounter: Payer: Self-pay | Admitting: Family Medicine

## 2022-01-27 VITALS — BP 112/82 | HR 71 | Temp 97.1°F | Ht 65.0 in | Wt 308.2 lb

## 2022-01-27 DIAGNOSIS — F4321 Adjustment disorder with depressed mood: Secondary | ICD-10-CM | POA: Diagnosis not present

## 2022-01-27 DIAGNOSIS — Z13220 Encounter for screening for lipoid disorders: Secondary | ICD-10-CM | POA: Diagnosis not present

## 2022-01-27 DIAGNOSIS — I10 Essential (primary) hypertension: Secondary | ICD-10-CM

## 2022-01-27 DIAGNOSIS — D72829 Elevated white blood cell count, unspecified: Secondary | ICD-10-CM | POA: Diagnosis not present

## 2022-01-27 DIAGNOSIS — E611 Iron deficiency: Secondary | ICD-10-CM | POA: Diagnosis not present

## 2022-01-27 LAB — COMPREHENSIVE METABOLIC PANEL
ALT: 16 U/L (ref 0–35)
AST: 16 U/L (ref 0–37)
Albumin: 3.7 g/dL (ref 3.5–5.2)
Alkaline Phosphatase: 80 U/L (ref 39–117)
BUN: 19 mg/dL (ref 6–23)
CO2: 31 mEq/L (ref 19–32)
Calcium: 9.4 mg/dL (ref 8.4–10.5)
Chloride: 100 mEq/L (ref 96–112)
Creatinine, Ser: 0.95 mg/dL (ref 0.40–1.20)
GFR: 76.36 mL/min (ref >60.00)
Glucose, Bld: 107 mg/dL — ABNORMAL HIGH (ref 70–99)
Potassium: 3.2 mEq/L — ABNORMAL LOW (ref 3.5–5.1)
Sodium: 139 mEq/L (ref 135–145)
Total Bilirubin: 0.3 mg/dL (ref 0.2–1.2)
Total Protein: 7.1 g/dL (ref 6.0–8.3)

## 2022-01-27 LAB — LIPID PANEL
Cholesterol: 158 mg/dL (ref 0–200)
HDL: 35.9 mg/dL — ABNORMAL LOW (ref >39.00)
LDL Cholesterol: 110 mg/dL — ABNORMAL HIGH (ref 0–99)
NonHDL: 121.73
Total CHOL/HDL Ratio: 4
Triglycerides: 61 mg/dL (ref 0.0–149.0)
VLDL: 12.2 mg/dL (ref 0.0–40.0)

## 2022-01-27 LAB — CBC WITH DIFFERENTIAL/PLATELET
Basophils Absolute: 0.1 10*3/uL (ref 0.0–0.1)
Basophils Relative: 0.7 % (ref 0.0–3.0)
Eosinophils Absolute: 0.1 10*3/uL (ref 0.0–0.7)
Eosinophils Relative: 0.4 % (ref 0.0–5.0)
HCT: 37.1 % (ref 36.0–46.0)
Hemoglobin: 11.9 g/dL — ABNORMAL LOW (ref 12.0–15.0)
Lymphocytes Relative: 13.4 % (ref 12.0–46.0)
Lymphs Abs: 1.8 10*3/uL (ref 0.7–4.0)
MCHC: 32 g/dL (ref 30.0–36.0)
MCV: 81.4 fl (ref 78.0–100.0)
Monocytes Absolute: 0.7 10*3/uL (ref 0.1–1.0)
Monocytes Relative: 5.1 % (ref 3.0–12.0)
Neutro Abs: 11 10*3/uL — ABNORMAL HIGH (ref 1.4–7.7)
Neutrophils Relative %: 80.4 % — ABNORMAL HIGH (ref 43.0–77.0)
Platelets: 375 10*3/uL (ref 150.0–400.0)
RBC: 4.56 Mil/uL (ref 3.87–5.11)
RDW: 14.2 % (ref 11.5–15.5)
WBC: 13.7 10*3/uL — ABNORMAL HIGH (ref 4.0–10.5)

## 2022-01-27 MED ORDER — CHLORTHALIDONE 25 MG PO TABS: 25.0000 mg | ORAL_TABLET | Freq: Every day | ORAL | 1 refills | Status: DC

## 2022-01-27 MED ORDER — AMLODIPINE BESYLATE 5 MG PO TABS: 5.0000 mg | ORAL_TABLET | Freq: Every day | ORAL | 1 refills | Status: DC

## 2022-01-27 NOTE — Progress Notes (Signed)
Established Patient Office Visit  Subjective   Patient ID: Courtney Mooney, female    DOB: 1985-12-27  Age: 36 y.o. MRN: [id number]  Chief Complaint  Patient presents with   [facility] Visit    F/u HTN, Bursitis of Left shoulder Tries to check BP every other day  Shoulder has gotten better No concerns today.    HPI follow-up for hypertension, screening for elevated cholesterol, leukocytosis and iron deficiency.  She is grieving.  Father passed away few weeks ago.  She was his only child and entailing with his estate.  Blood pressure is trending downwards.  Tolerating medications well.    Review of Systems  Constitutional:  Negative for chills, diaphoresis, malaise/fatigue and weight loss.  HENT: Negative.    Eyes: Negative.  Negative for blurred vision and double vision.  Cardiovascular:  Negative for chest pain.  Gastrointestinal:  Negative for abdominal pain.  Genitourinary: Negative.   Musculoskeletal:  Negative for falls and myalgias.  Neurological:  Negative for speech change, loss of consciousness and weakness.  Psychiatric/Behavioral: Negative.        Objective:     BP 112/82 (BP Location: Right Arm, Patient Position: Sitting, Cuff Size: Normal)   Pulse 71   Temp (!) 97.1 F (36.2 C) (Temporal)   Ht '5\' 5"'$  (1.651 m)   Wt (!) 308 lb 3.2 oz (139.8 kg)   SpO2 99%   BMI 51.29 kg/m  BP Readings from Last 3 Encounters:  01/27/22 112/82  11/27/21 138/82  11/20/21 133/67   Wt Readings from Last 3 Encounters:  01/27/22 (!) 308 lb 3.2 oz (139.8 kg)  11/27/21 (!) 316 lb 6.4 oz (143.5 kg)  11/20/21 (!) 318 lb (144.2 kg)      Physical Exam Constitutional:      General: She is not in acute distress.    Appearance: Normal appearance. She is not ill-appearing, toxic-appearing or diaphoretic.  HENT:     Head: Normocephalic and atraumatic.     Right Ear: External ear normal.     Left Ear: External ear normal.     Mouth/Throat:     Mouth: Mucous membranes are moist.      Pharynx: Oropharynx is clear. No oropharyngeal exudate or posterior oropharyngeal erythema.  Eyes:     General: No scleral icterus.       Right eye: No discharge.        Left eye: No discharge.     Extraocular Movements: Extraocular movements intact.     Conjunctiva/sclera: Conjunctivae normal.     Pupils: Pupils are equal, round, and reactive to light.  Cardiovascular:     Rate and Rhythm: Normal rate and regular rhythm.  Pulmonary:     Effort: Pulmonary effort is normal. No respiratory distress.     Breath sounds: Normal breath sounds.  Musculoskeletal:     Cervical back: No rigidity or tenderness.  Skin:    General: Skin is warm and dry.  Neurological:     Mental Status: She is alert and oriented to person, place, and time.  Psychiatric:        Mood and Affect: Mood normal.        Behavior: Behavior normal.      No results found for any visits on 01/27/22.    The ASCVD Risk score (Arnett DK, et al., 2019) failed to calculate for the following reasons:   The 2019 ASCVD risk score is only valid for ages 33 to 60    Assessment & Plan:  Problem List Items Addressed This Visit       Cardiovascular and Mediastinum   Essential hypertension   Relevant Medications   amLODipine (NORVASC) 5 MG tablet   chlorthalidone (HYGROTON) 25 MG tablet   Other Relevant Orders   CBC w/Diff   Comprehensive metabolic panel     Other   Screening for hypercholesterolemia   Relevant Orders   Lipid panel   Leukocytosis - Primary   Relevant Orders   CBC w/Diff   Iron deficiency   Relevant Orders   Iron, TIBC and Ferritin Panel   Grieving    Return in about 6 months (around 07/30/2022).  Continue current blood pressure medication.  Goal for therapy would be in the less than 130/80 range.  Information given on managing hypertension.  Screening lipid profile today.  Rechecking elevated white count.  Follow-up in 6 months or sooner if needed.  Libby Maw, MD

## 2022-01-28 LAB — IRON,TIBC AND FERRITIN PANEL
%SAT: 6 % (calc) — ABNORMAL LOW (ref 16–45)
Ferritin: 59 ng/mL (ref 16–154)
Iron: 16 ug/dL — ABNORMAL LOW (ref 40–190)
TIBC: 254 mcg/dL (calc) (ref 250–450)

## 2022-01-29 ENCOUNTER — Telehealth: Payer: Self-pay

## 2022-01-29 NOTE — Telephone Encounter (Signed)
-----   Message from Libby Maw, MD sent at 01/29/2022  7:49 AM EDT ----- There are some lab issues that we need to discuss. Please schedule patient for an ov. Virtual would be okay.

## 2022-01-29 NOTE — Telephone Encounter (Signed)
Call to assist pt schedule appt per Dr. Bebe Shaggy request. MyChart Video Visit scheduled for Friday, 7/7 @ 3:20pm.

## 2022-01-31 ENCOUNTER — Ambulatory Visit: Payer: BC Managed Care – PPO | Admitting: Family Medicine

## 2022-01-31 ENCOUNTER — Encounter: Payer: Self-pay | Admitting: Family Medicine

## 2022-01-31 ENCOUNTER — Telehealth (INDEPENDENT_AMBULATORY_CARE_PROVIDER_SITE_OTHER): Payer: BC Managed Care – PPO | Admitting: Family Medicine

## 2022-01-31 VITALS — Ht 65.0 in | Wt 308.0 lb

## 2022-01-31 DIAGNOSIS — D72829 Elevated white blood cell count, unspecified: Secondary | ICD-10-CM | POA: Diagnosis not present

## 2022-01-31 DIAGNOSIS — E876 Hypokalemia: Secondary | ICD-10-CM | POA: Diagnosis not present

## 2022-01-31 MED ORDER — POTASSIUM CHLORIDE CRYS ER 20 MEQ PO TBCR: 20.0000 meq | EXTENDED_RELEASE_TABLET | Freq: Every day | ORAL | 1 refills | Status: DC

## 2022-01-31 NOTE — Progress Notes (Signed)
Established Patient Office Visit  Subjective   Patient ID: Courtney Mooney, female    DOB: 10-09-1985  Age: 36 y.o. MRN: [id number]  Chief Complaint  Patient presents with   Office Visit    Going over lab results  BP couldn't be obtained by pt    HPI for discussion of recent lab work that showed mild anemia, microcytic side with low iron levels.  Continues to administration.  Has ongoing leukocytosis.  Had been ill with a URI the week before her last blood count was drawn.  She has been under a lot of stress over these last few years with multiple family deaths.  Status post hematology work-up.  LDH was mildly elevated.  Feeling well now.  No weight loss or night sweats.  Mom has elevated white count as well.    Review of Systems  Constitutional:  Negative for chills, diaphoresis, malaise/fatigue and weight loss.  HENT: Negative.    Eyes: Negative.  Negative for blurred vision and double vision.  Cardiovascular:  Negative for chest pain.  Gastrointestinal:  Negative for abdominal pain.  Genitourinary: Negative.   Musculoskeletal:  Negative for falls and myalgias.  Neurological:  Negative for speech change, loss of consciousness and weakness.  Psychiatric/Behavioral: Negative.        Objective:     Ht '5\' 5"'$  (1.651 m)   Wt (!) 308 lb (139.7 kg)   BMI 51.25 kg/m    Physical Exam Constitutional:      General: She is not in acute distress.    Appearance: Normal appearance. She is not ill-appearing, toxic-appearing or diaphoretic.  HENT:     Head: Normocephalic and atraumatic.     Right Ear: External ear normal.     Left Ear: External ear normal.     Mouth/Throat:     Pharynx: No oropharyngeal exudate or posterior oropharyngeal erythema.  Eyes:     General: No scleral icterus.       Right eye: No discharge.        Left eye: No discharge.     Extraocular Movements: Extraocular movements intact.     Conjunctiva/sclera: Conjunctivae normal.  Pulmonary:     Effort:  Pulmonary effort is normal.  Skin:    General: Skin is warm and dry.  Neurological:     Mental Status: She is alert and oriented to person, place, and time.  Psychiatric:        Mood and Affect: Mood normal.        Behavior: Behavior normal.      No results found for any visits on 01/31/22.    The ASCVD Risk score (Arnett DK, et al., 2019) failed to calculate for the following reasons:   The 2019 ASCVD risk score is only valid for ages 8 to 83    Assessment & Plan:   Problem List Items Addressed This Visit       Other   Leukocytosis - Primary   Relevant Orders   Lactate dehydrogenase   Hypokalemia   Relevant Medications   potassium chloride SA (KLOR-CON M) 20 MEQ tablet    Return in about 6 months (around 08/03/2022).  Continue chlorthalidone and add potassium chloride 20 mEq daily with it.  Will return for recheck of the LDH.  Libby Maw, MD  Virtual Visit via Video Note  I connected with Courtney Mooney on 01/31/22 at  3:20 PM EDT by a video enabled telemedicine application and verified that I am speaking with the correct  person using two identifiers.  Location: Patient: work alone near her car.  Provider: work   I discussed the limitations of evaluation and management by telemedicine and the availability of in person appointments. The patient expressed understanding and agreed to proceed.  History of Present Illness:    Observations/Objective:   Assessment and Plan:   Follow Up Instructions:    I discussed the assessment and treatment plan with the patient. The patient was provided an opportunity to ask questions and all were answered. The patient agreed with the plan and demonstrated an understanding of the instructions.   The patient was advised to call back or seek an in-person evaluation if the symptoms worsen or if the condition fails to improve as anticipated.  I provided 20 minutes of non-face-to-face time during this  encounter.   Libby Maw, MD

## 2022-02-04 ENCOUNTER — Other Ambulatory Visit (INDEPENDENT_AMBULATORY_CARE_PROVIDER_SITE_OTHER): Payer: BC Managed Care – PPO

## 2022-02-04 DIAGNOSIS — D72829 Elevated white blood cell count, unspecified: Secondary | ICD-10-CM | POA: Diagnosis not present

## 2022-02-05 LAB — LACTATE DEHYDROGENASE: LDH: 157 U/L (ref 100–200)

## 2022-03-05 ENCOUNTER — Encounter (INDEPENDENT_AMBULATORY_CARE_PROVIDER_SITE_OTHER): Payer: Self-pay

## 2022-05-09 ENCOUNTER — Telehealth: Payer: Self-pay | Admitting: Family Medicine

## 2022-05-09 DIAGNOSIS — E876 Hypokalemia: Secondary | ICD-10-CM

## 2022-05-09 DIAGNOSIS — I10 Essential (primary) hypertension: Secondary | ICD-10-CM

## 2022-05-09 NOTE — Telephone Encounter (Signed)
Pt is having a hard time tolerating her potassium chloride SA pill. It's giving her abdominal pain. She wants to switch her diuretic of chlorthalidone to any that does not deplete her Potassium. Please advise pt @ [facility], [address]  [place], [place] [address]  Phone:  [phone]  Fax:  [phone]

## 2022-05-12 MED ORDER — SPIRONOLACTONE 25 MG PO TABS: 25.0000 mg | ORAL_TABLET | Freq: Every day | ORAL | 0 refills | Status: DC

## 2022-05-12 NOTE — Telephone Encounter (Signed)
Please advise message below  °

## 2022-05-12 NOTE — Telephone Encounter (Signed)
Patient aware and will pick up new Rx will call us back to schedule follow up appointment.

## 2022-06-18 ENCOUNTER — Ambulatory Visit: Payer: BC Managed Care – PPO | Admitting: Family Medicine

## 2022-06-18 ENCOUNTER — Encounter: Payer: Self-pay | Admitting: Family Medicine

## 2022-06-18 VITALS — BP 128/82 | HR 89 | Temp 97.2°F | Ht 65.0 in | Wt 319.0 lb

## 2022-06-18 DIAGNOSIS — E876 Hypokalemia: Secondary | ICD-10-CM

## 2022-06-18 DIAGNOSIS — E611 Iron deficiency: Secondary | ICD-10-CM

## 2022-06-18 DIAGNOSIS — R7303 Prediabetes: Secondary | ICD-10-CM

## 2022-06-18 DIAGNOSIS — F4321 Adjustment disorder with depressed mood: Secondary | ICD-10-CM

## 2022-06-18 DIAGNOSIS — D72829 Elevated white blood cell count, unspecified: Secondary | ICD-10-CM | POA: Diagnosis not present

## 2022-06-18 DIAGNOSIS — I10 Essential (primary) hypertension: Secondary | ICD-10-CM | POA: Diagnosis not present

## 2022-06-18 DIAGNOSIS — Z23 Encounter for immunization: Secondary | ICD-10-CM | POA: Diagnosis not present

## 2022-06-18 LAB — BASIC METABOLIC PANEL
BUN: 15 mg/dL (ref 6–23)
CO2: 30 mEq/L (ref 19–32)
Calcium: 8.8 mg/dL (ref 8.4–10.5)
Chloride: 103 mEq/L (ref 96–112)
Creatinine, Ser: 0.77 mg/dL (ref 0.40–1.20)
GFR: 97.99 mL/min (ref >60.00)
Glucose, Bld: 94 mg/dL (ref 70–99)
Potassium: 3.7 mEq/L (ref 3.5–5.1)
Sodium: 138 mEq/L (ref 135–145)

## 2022-06-18 LAB — CBC
HCT: 40.1 % (ref 36.0–46.0)
Hemoglobin: 13 g/dL (ref 12.0–15.0)
MCHC: 32.5 g/dL (ref 30.0–36.0)
MCV: 81.5 fl (ref 78.0–100.0)
Platelets: 438 10*3/uL — ABNORMAL HIGH (ref 150.0–400.0)
RBC: 4.92 Mil/uL (ref 3.87–5.11)
RDW: 14.4 % (ref 11.5–15.5)
WBC: 11.3 10*3/uL — ABNORMAL HIGH (ref 4.0–10.5)

## 2022-06-18 MED ORDER — SPIRONOLACTONE 50 MG PO TABS: 50.0000 mg | ORAL_TABLET | Freq: Every day | ORAL | 1 refills | Status: DC

## 2022-06-18 MED ORDER — AMLODIPINE BESYLATE 5 MG PO TABS
5.0000 mg | ORAL_TABLET | Freq: Every day | ORAL | 1 refills | Status: DC
Start: 2022-06-18 — End: 2023-03-09

## 2022-06-18 NOTE — Progress Notes (Addendum)
Established Patient Office Visit  Subjective   Patient ID: Courtney Mooney, female    DOB: 28-Feb-1986  Age: 36 y.o. MRN: [id number]  Chief Complaint  Patient presents with   Follow-up    Follow up on medication concerns about tingling feeling not sure if this is medication. BP still elevated at home. Patient fasting.     HPI for follow-up of hypertension, hypokalemia, iron deficiency and grieving.  She has been experiencing paresthesias about her body.  Continues to grieve her father's death 5 months ago.  He will be missed Thanksgiving tomorrow course.  She thinks it could be anxiety.  Wonders about medication side effect.  Has been checking blood pressures daily.  They have been running in the 130-150/80-90 range.  Tolerating medications well otherwise.  Takes iron tablets and Gummies when she remembers to do so.  Continues with menses monthly.  Had difficulty taking potassium pills.    Review of Systems  Constitutional: Negative.   HENT: Negative.    Eyes:  Negative for blurred vision, discharge and redness.  Respiratory: Negative.    Cardiovascular: Negative.   Gastrointestinal:  Negative for abdominal pain.  Genitourinary: Negative.   Musculoskeletal: Negative.  Negative for myalgias.  Skin:  Negative for rash.  Neurological:  Negative for tingling, loss of consciousness and weakness.  Endo/Heme/Allergies:  Negative for polydipsia.      Objective:     BP 128/82 (BP Location: Left Arm, Patient Position: Sitting)   Pulse 89   Temp (!) 97.2 F (36.2 C) (Temporal)   Ht '5\' 5"'$  (1.651 m)   Wt (!) 319 lb (144.7 kg)   SpO2 97%   BMI 53.08 kg/m    Physical Exam Constitutional:      General: She is not in acute distress.    Appearance: Normal appearance. She is not ill-appearing, toxic-appearing or diaphoretic.  HENT:     Head: Normocephalic and atraumatic.     Right Ear: External ear normal.     Left Ear: External ear normal.  Eyes:     General: No scleral icterus.        Right eye: No discharge.        Left eye: No discharge.     Extraocular Movements: Extraocular movements intact.     Conjunctiva/sclera: Conjunctivae normal.  Cardiovascular:     Rate and Rhythm: Normal rate.  Pulmonary:     Effort: Pulmonary effort is normal. No respiratory distress.  Skin:    General: Skin is warm and dry.  Neurological:     Mental Status: She is alert and oriented to person, place, and time.  Psychiatric:        Mood and Affect: Mood normal.        Behavior: Behavior normal.      Results for orders placed or performed in visit on 53/66/44  Basic metabolic panel  Result Value Ref Range   Sodium 138 135 - 145 mEq/L   Potassium 3.7 3.5 - 5.1 mEq/L   Chloride 103 96 - 112 mEq/L   CO2 30 19 - 32 mEq/L   Glucose, Bld 94 70 - 99 mg/dL   BUN 15 6 - 23 mg/dL   Creatinine, Ser 0.77 0.40 - 1.20 mg/dL   GFR 97.99 >60.00 mL/min   Calcium 8.8 8.4 - 10.5 mg/dL  CBC  Result Value Ref Range   WBC 11.3 (H) 4.0 - 10.5 K/uL   RBC 4.92 3.87 - 5.11 Mil/uL   Platelets 438.0 (H) 150.0 -  400.0 K/uL   Hemoglobin 13.0 12.0 - 15.0 g/dL   HCT 40.1 36.0 - 46.0 %   MCV 81.5 78.0 - 100.0 fl   MCHC 32.5 30.0 - 36.0 g/dL   RDW 14.4 11.5 - 15.5 %  Iron, TIBC and Ferritin Panel  Result Value Ref Range   Iron 68 40 - 190 mcg/dL   TIBC 280 250 - 450 mcg/dL (calc)   %SAT 24 16 - 45 % (calc)   Ferritin 31 16 - 154 ng/mL      The ASCVD Risk score (Arnett DK, et al., 2019) failed to calculate for the following reasons:   The 2019 ASCVD risk score is only valid for ages 64 to 68    Assessment & Plan:   Problem List Items Addressed This Visit       Cardiovascular and Mediastinum   Essential hypertension   Relevant Medications   spironolactone (ALDACTONE) 50 MG tablet   amLODipine (NORVASC) 5 MG tablet   Other Relevant Orders   Basic metabolic panel (Completed)     Other   Need for influenza vaccination   Relevant Orders   Flu Vaccine QUAD 6+ mos PF IM (Fluarix  Quad PF) (Completed)   Leukocytosis   Relevant Orders   CBC (Completed)   Prediabetes   Relevant Medications   metFORMIN (GLUCOPHAGE-XR) 500 MG 24 hr tablet   Iron deficiency   Relevant Orders   Iron, TIBC and Ferritin Panel (Completed)   Grieving   Hypokalemia - Primary   Relevant Orders   Basic metabolic panel (Completed)    Return in about 6 weeks (around 07/30/2022).  Have increased Aldactone to 50 mg daily.  Continue amlodipine 10 mg.  Rechecking potassium,  WBCs and iron levels.  We will continue to check and record blood pressures.  Libby Maw, MD

## 2022-06-19 LAB — IRON,TIBC AND FERRITIN PANEL
%SAT: 24 % (calc) (ref 16–45)
Ferritin: 31 ng/mL (ref 16–154)
Iron: 68 ug/dL (ref 40–190)
TIBC: 280 mcg/dL (calc) (ref 250–450)

## 2022-06-24 MED ORDER — METFORMIN HCL ER 500 MG PO TB24: 500.0000 mg | ORAL_TABLET | Freq: Every evening | ORAL | 1 refills | Status: DC

## 2022-06-24 NOTE — Addendum Note (Signed)
Addended by: Abelino Derrick A on: 06/24/2022 12:02 PM   Modules accepted: Orders

## 2022-07-26 DIAGNOSIS — L03011 Cellulitis of right finger: Secondary | ICD-10-CM | POA: Diagnosis not present

## 2022-08-01 ENCOUNTER — Ambulatory Visit: Payer: BC Managed Care – PPO | Admitting: Family Medicine

## 2022-08-05 ENCOUNTER — Encounter: Payer: Self-pay | Admitting: Family Medicine

## 2022-08-05 ENCOUNTER — Ambulatory Visit: Payer: BC Managed Care – PPO | Admitting: Family Medicine

## 2022-08-15 ENCOUNTER — Ambulatory Visit (INDEPENDENT_AMBULATORY_CARE_PROVIDER_SITE_OTHER): Payer: BC Managed Care – PPO | Admitting: Family Medicine

## 2022-08-15 ENCOUNTER — Encounter: Payer: Self-pay | Admitting: Family Medicine

## 2022-08-15 VITALS — BP 118/70 | HR 78 | Ht 65.0 in | Wt 319.6 lb

## 2022-08-15 DIAGNOSIS — R7303 Prediabetes: Secondary | ICD-10-CM

## 2022-08-15 DIAGNOSIS — M25511 Pain in right shoulder: Secondary | ICD-10-CM | POA: Diagnosis not present

## 2022-08-15 DIAGNOSIS — M25512 Pain in left shoulder: Secondary | ICD-10-CM | POA: Diagnosis not present

## 2022-08-15 DIAGNOSIS — I10 Essential (primary) hypertension: Secondary | ICD-10-CM | POA: Diagnosis not present

## 2022-08-15 LAB — HEMOGLOBIN A1C: Hgb A1c MFr Bld: 6.3 % (ref 4.6–6.5)

## 2022-08-15 LAB — C-REACTIVE PROTEIN: CRP: 1.4 mg/dL (ref 0.5–20.0)

## 2022-08-15 NOTE — Progress Notes (Signed)
Established Patient Office Visit   Subjective:  Patient ID: Courtney Mooney, female    DOB: Jan 23, 1986  Age: 37 y.o. MRN: [id number]  Chief Complaint  Patient presents with   Annual Exam    CPE. Fasting. Right Shoulder pain.    HPI Encounter Diagnoses  Name Primary?   Essential hypertension Yes   Prediabetes    Obesity, morbid, BMI 50 or higher (HCC)    Bilateral shoulder pain, unspecified chronicity    For follow-up of above.  Blood pressure has been running higher at home.  She is tolerating the metformin and Aldactone well.  Blood pressure has been well-controlled.  Right greater than left shoulder pain.  No injury.  No overhead shoulder work.  She is right-hand dominant.  She is experiencing posterior neck pain.  There is no numbness or tingling.   Review of Systems  Constitutional: Negative.   HENT: Negative.    Eyes:  Negative for blurred vision, discharge and redness.  Respiratory: Negative.    Cardiovascular: Negative.   Gastrointestinal:  Negative for abdominal pain.  Genitourinary: Negative.   Musculoskeletal:  Positive for joint pain and neck pain. Negative for myalgias.  Skin:  Negative for rash.  Neurological:  Negative for tingling, loss of consciousness and weakness.  Endo/Heme/Allergies:  Negative for polydipsia.     Current Outpatient Medications:    amLODipine (NORVASC) 5 MG tablet, Take 1 tablet (5 mg total) by mouth daily., Disp: 90 tablet, Rfl: 1   ASHWAGANDHA PO, Take 1 capsule by mouth daily., Disp: , Rfl:    cetirizine (ZYRTEC) 10 MG tablet, Take 10 mg by mouth daily., Disp: , Rfl:    metFORMIN (GLUCOPHAGE-XR) 500 MG 24 hr tablet, Take 1 tablet (500 mg total) by mouth at bedtime., Disp: 90 tablet, Rfl: 1   Multiple Vitamin (MULTIVITAMIN) capsule, Take 1 capsule by mouth daily., Disp: , Rfl:    spironolactone (ALDACTONE) 50 MG tablet, Take 1 tablet (50 mg total) by mouth daily., Disp: 90 tablet, Rfl: 1   diclofenac Sodium (VOLTAREN) 1 % GEL, Rub  a small grape sized dollop on sore area beneath the [facility] joint of left shoulder 3 times daily. (Patient not taking: Reported on 08/15/2022), Disp: 150 g, Rfl: 1   Objective:     BP 118/70 (BP Location: Right Arm, Patient Position: Sitting)   Pulse 78   Ht '5\' 5"'$  (1.651 m)   Wt (!) 319 lb 9.6 oz (145 kg)   LMP 08/04/2022   SpO2 95%   BMI 53.18 kg/m  BP Readings from Last 3 Encounters:  08/15/22 118/70  06/18/22 128/82  01/27/22 112/82   Wt Readings from Last 3 Encounters:  08/15/22 (!) 319 lb 9.6 oz (145 kg)  06/18/22 (!) 319 lb (144.7 kg)  01/31/22 (!) 308 lb (139.7 kg)      Physical Exam Constitutional:      General: She is not in acute distress.    Appearance: Normal appearance. She is obese. She is not ill-appearing, toxic-appearing or diaphoretic.  HENT:     Head: Normocephalic and atraumatic.     Right Ear: External ear normal.     Left Ear: External ear normal.     Mouth/Throat:     Mouth: Mucous membranes are moist.     Pharynx: Oropharynx is clear. No oropharyngeal exudate or posterior oropharyngeal erythema.  Eyes:     General: No scleral icterus.       Right eye: No discharge.  Left eye: No discharge.     Extraocular Movements: Extraocular movements intact.     Conjunctiva/sclera: Conjunctivae normal.     Pupils: Pupils are equal, round, and reactive to light.  Cardiovascular:     Rate and Rhythm: Normal rate and regular rhythm.  Pulmonary:     Effort: Pulmonary effort is normal. No respiratory distress.     Breath sounds: Normal breath sounds.  Abdominal:     General: Bowel sounds are normal.     Tenderness: There is no abdominal tenderness. There is no guarding.  Musculoskeletal:     Right shoulder: Normal range of motion. Normal strength.     Left shoulder: Normal range of motion. Normal strength.     Cervical back: No rigidity, tenderness or bony tenderness. Normal range of motion.     Comments: Impingement signs were positive bilaterally.   Skin:    General: Skin is warm and dry.  Neurological:     Mental Status: She is alert and oriented to person, place, and time.  Psychiatric:        Mood and Affect: Mood normal.        Behavior: Behavior normal.      No results found for any visits on 08/15/22.    The ASCVD Risk score (Arnett DK, et al., 2019) failed to calculate for the following reasons:   The 2019 ASCVD risk score is only valid for ages 50 to 67    Assessment & Plan:   Essential hypertension  Prediabetes -     Hemoglobin A1c  Obesity, morbid, BMI 50 or higher (HCC)  Bilateral shoulder pain, unspecified chronicity -     C-reactive protein -     Ambulatory referral to Sports Medicine    Return in about 6 months (around 02/13/2023), or if symptoms worsen or fail to improve.  Sports medicine referral for chronic shoulder pain.  Information was given on Mounjaro.  Discussed possible side effects of abdominal pain, intestinal obstruction.  Discussed the theoretical risk of thyroid C cancer.  She will let me know.  Could consider nutrition counseling.  Continue metformin.  Encouraged exercise by walking.  Libby Maw, MD

## 2022-08-21 ENCOUNTER — Ambulatory Visit: Payer: BC Managed Care – PPO | Admitting: Family Medicine

## 2022-09-16 ENCOUNTER — Encounter: Payer: Self-pay | Admitting: Family Medicine

## 2022-09-16 DIAGNOSIS — R7303 Prediabetes: Secondary | ICD-10-CM

## 2022-09-29 MED ORDER — TIRZEPATIDE 5 MG/0.5ML ~~LOC~~ SOAJ: 5.0000 mg | SUBCUTANEOUS | 1 refills | Status: DC

## 2022-09-29 NOTE — Telephone Encounter (Signed)
Please advise message below patient would like to know if she could start on Ozempic or something to help with weight loss

## 2022-10-08 ENCOUNTER — Ambulatory Visit (INDEPENDENT_AMBULATORY_CARE_PROVIDER_SITE_OTHER): Payer: BC Managed Care – PPO | Admitting: Obstetrics & Gynecology

## 2022-10-08 ENCOUNTER — Encounter: Payer: Self-pay | Admitting: Obstetrics & Gynecology

## 2022-10-08 ENCOUNTER — Other Ambulatory Visit (HOSPITAL_COMMUNITY)
Admission: RE | Admit: 2022-10-08 | Discharge: 2022-10-08 | Disposition: A | Discharge status: Home / Self Care | Payer: BC Managed Care – PPO | Source: Ambulatory Visit | Attending: Obstetrics & Gynecology | Admitting: Obstetrics & Gynecology

## 2022-10-08 VITALS — BP 131/84 | HR 87 | Ht 65.0 in | Wt 323.0 lb

## 2022-10-08 DIAGNOSIS — Z01419 Encounter for gynecological examination (general) (routine) without abnormal findings: Secondary | ICD-10-CM | POA: Insufficient documentation

## 2022-10-08 DIAGNOSIS — Z113 Encounter for screening for infections with a predominantly sexual mode of transmission: Secondary | ICD-10-CM

## 2022-10-08 DIAGNOSIS — Z3202 Encounter for pregnancy test, result negative: Secondary | ICD-10-CM

## 2022-10-08 DIAGNOSIS — Z6841 Body Mass Index (BMI) 40.0 and over, adult: Secondary | ICD-10-CM

## 2022-10-08 DIAGNOSIS — N939 Abnormal uterine and vaginal bleeding, unspecified: Secondary | ICD-10-CM | POA: Diagnosis not present

## 2022-10-08 LAB — POCT URINE PREGNANCY: Preg Test, Ur: NEGATIVE

## 2022-10-08 NOTE — Progress Notes (Signed)
Patient states her period started coming on every 2 weeks since December. Courtney Mooney l Analys Ryden, CMA

## 2022-10-08 NOTE — Progress Notes (Signed)
Subjective:     Courtney Mooney is a 37 y.o. female here for a routine exam.  Current complaints: Pt reports that since Dec she has 2 menses per month. This last cycle was normal at 28 days. She wonders if it is related to a change in her meds. She was dx'd with pre-DM. She would also like an STI screen. No known exposure.    Gynecologic History Patient's last menstrual period was 09/18/2022 (exact date). Contraception: tubal ligation Last Pap: 03/22/2021. Results were: normal Last mammogram: n/a.   Obstetric History OB History  Gravida Para Term Preterm AB Living  '3 2 2   1 2  '$ SAB IAB Ectopic Multiple Live Births  1     0 2    # Outcome Date GA Lbr Len/2nd Weight Sex Delivery Anes PTL Lv  3 Term 07/15/17 [redacted]w[redacted]d 7 lb 4.9 oz (3.315 kg) F CS-LTranv Spinal  LIV  2 Term      CS-LTranv Spinal  LIV  1 SAB             The following portions of the patient's history were reviewed and updated as appropriate: allergies, current medications, past family history, past medical history, past social history, past surgical history, and problem list.  Review of Systems Pertinent items are noted in HPI.    Objective:  BP 131/84   Pulse 87   Ht '5\' 5"'$  (1.651 m)   Wt (!) 323 lb (146.5 kg)   LMP 09/18/2022 (Exact Date)   BMI 53.75 kg/m   General Appearance:    Alert, cooperative, no distress, appears stated age  Head:    Normocephalic, without obvious abnormality, atraumatic  Eyes:    conjunctiva/corneas clear, EOM's intact, both eyes  Ears:    Normal external ear canals, both ears  Nose:   Nares normal, septum midline, mucosa normal, no drainage    or sinus tenderness  Throat:   Lips, mucosa, and tongue normal; teeth and gums normal  Neck:   Supple, symmetrical, trachea midline, no adenopathy;    thyroid:  no enlargement/tenderness/nodules  Back:     Symmetric, no curvature, ROM normal, no CVA tenderness  Lungs:     Clear to auscultation bilaterally, respirations unlabored  Chest Wall:     No tenderness or deformity   Heart:    Regular rate and rhythm, S1 and S2 normal, no murmur, rub   or gallop  Breast Exam:    No tenderness, masses, or nipple abnormality  Abdomen:     Soft, non-tender, bowel sounds active all four quadrants,    no masses, no organomegaly  Genitalia:    Normal female without lesion, discharge or tenderness     Extremities:   Extremities normal, atraumatic, no cyanosis or edema  Pulses:   2+ and symmetric all extremities  Skin:   Skin color, texture, turgor normal, no rashes or lesions     03/25/2021 CLINICAL DATA:  Dysmenorrhea, LMP 03/06/2021   EXAM: TRANSABDOMINAL AND TRANSVAGINAL ULTRASOUND OF PELVIS   TECHNIQUE: Both transabdominal and transvaginal ultrasound examinations of the pelvis were performed. Transabdominal technique was performed for global imaging of the pelvis including uterus, ovaries, adnexal regions, and pelvic cul-de-sac. It was necessary to proceed with endovaginal exam following the transabdominal exam to visualize the uterus, endometrium, and ovaries. Transabdominal imaging limited by decompressed urinary bladder and body habitus.   COMPARISON:  None   FINDINGS: Uterus   Measurements: 8.0 x 4.6 x 4.8 cm = volume: 91 mL.  Anteverted. Heterogeneous myometrium. Anterior wall intramural leiomyoma 2.0 x 2.0 x 1.8 cm. No additional discrete masses.   Endometrium   Thickness: 6 mm.  No endometrial fluid or focal abnormality   Right ovary   Measurements: 3.1 x 1.5 x 2.8 cm = volume: 7.0 mL. Normal morphology without mass   Left ovary   Measurements: 5.1 x 2.4 x 1.7 cm = volume: 10.7 mL. Normal morphology without mass   Other findings   Trace free pelvic fluid.  No adnexal masses.   IMPRESSION: Single intramural leiomyoma anterior uterus 2.0 cm diameter.   Remainder of exam unremarkable.     Assessment:    Healthy female exam.  AUB- pt would like to hold off on endo bx to see if her sx have resolved. She does  agree to a pelvic US.  BMI 53- d/w pt options for weight loss.   She has considered surgery in the past. She would like to reconsider this option.     Plan:  Courtney Mooney was seen today for gynecologic exam.  Diagnoses and all orders for this visit:  Well female exam with routine gynecological exam -     Cytology - PAP( Courtney Mooney)  Abnormal uterine bleeding (AUB) -     US PELVIS TRANSVAGINAL NON-OB (TV ONLY); Future  Routine screening for STI (sexually transmitted infection) -     Cytology - PAP( Courtney Mooney) -     HIV antibody (with reflex) -     Hepatitis C Antibody -     Hepatitis B Surface AntiGEN -     RPR  Adult BMI 50.0-59.9 kg/sq m (Courtney Mooney) -     Ambulatory referral to General Surgery    Pt to f/u in 3 months or sooner prn for endo bx if the abnormal bleeding persists.   Courtney Mooney L. Courtney Mooney, M.D., Courtney Mooney

## 2022-10-09 ENCOUNTER — Other Ambulatory Visit (HOSPITAL_BASED_OUTPATIENT_CLINIC_OR_DEPARTMENT_OTHER): Payer: BC Managed Care – PPO

## 2022-10-09 LAB — HEPATITIS C ANTIBODY: Hep C Virus Ab: NONREACTIVE

## 2022-10-09 LAB — HEPATITIS B SURFACE ANTIGEN: Hepatitis B Surface Ag: NEGATIVE

## 2022-10-09 LAB — HIV ANTIBODY (ROUTINE TESTING W REFLEX): HIV Screen 4th Generation wRfx: NONREACTIVE

## 2022-10-09 LAB — RPR: RPR Ser Ql: NONREACTIVE

## 2022-10-10 ENCOUNTER — Ambulatory Visit (HOSPITAL_BASED_OUTPATIENT_CLINIC_OR_DEPARTMENT_OTHER)
Admission: RE | Admit: 2022-10-10 | Discharge: 2022-10-10 | Disposition: A | Discharge status: Home / Self Care | Payer: BC Managed Care – PPO | Source: Ambulatory Visit | Attending: Obstetrics & Gynecology | Admitting: Obstetrics & Gynecology

## 2022-10-10 ENCOUNTER — Other Ambulatory Visit: Payer: Self-pay | Admitting: Obstetrics & Gynecology

## 2022-10-10 DIAGNOSIS — Z01419 Encounter for gynecological examination (general) (routine) without abnormal findings: Secondary | ICD-10-CM

## 2022-10-10 DIAGNOSIS — D251 Intramural leiomyoma of uterus: Secondary | ICD-10-CM | POA: Diagnosis not present

## 2022-10-10 DIAGNOSIS — N939 Abnormal uterine and vaginal bleeding, unspecified: Secondary | ICD-10-CM | POA: Insufficient documentation

## 2022-10-10 DIAGNOSIS — Z6841 Body Mass Index (BMI) 40.0 and over, adult: Secondary | ICD-10-CM

## 2022-10-10 DIAGNOSIS — Z113 Encounter for screening for infections with a predominantly sexual mode of transmission: Secondary | ICD-10-CM

## 2022-10-10 LAB — CYTOLOGY - PAP
Chlamydia: NEGATIVE
Comment: NEGATIVE
Comment: NEGATIVE
Comment: NEGATIVE
Comment: NORMAL
Diagnosis: NEGATIVE
High risk HPV: NEGATIVE
Neisseria Gonorrhea: NEGATIVE
Trichomonas: NEGATIVE

## 2022-11-07 DIAGNOSIS — D251 Intramural leiomyoma of uterus: Secondary | ICD-10-CM | POA: Diagnosis not present

## 2022-11-07 DIAGNOSIS — E611 Iron deficiency: Secondary | ICD-10-CM | POA: Diagnosis not present

## 2022-11-07 DIAGNOSIS — D250 Submucous leiomyoma of uterus: Secondary | ICD-10-CM | POA: Diagnosis not present

## 2022-11-07 DIAGNOSIS — I10 Essential (primary) hypertension: Secondary | ICD-10-CM | POA: Diagnosis not present

## 2022-11-07 DIAGNOSIS — E569 Vitamin deficiency, unspecified: Secondary | ICD-10-CM | POA: Diagnosis not present

## 2022-11-10 ENCOUNTER — Other Ambulatory Visit (HOSPITAL_COMMUNITY): Payer: Self-pay | Admitting: General Surgery

## 2022-11-10 DIAGNOSIS — I10 Essential (primary) hypertension: Secondary | ICD-10-CM

## 2022-11-10 DIAGNOSIS — D251 Intramural leiomyoma of uterus: Secondary | ICD-10-CM

## 2022-11-10 DIAGNOSIS — E559 Vitamin D deficiency, unspecified: Secondary | ICD-10-CM

## 2022-11-10 DIAGNOSIS — E611 Iron deficiency: Secondary | ICD-10-CM

## 2022-11-17 ENCOUNTER — Ambulatory Visit (HOSPITAL_COMMUNITY)
Admission: RE | Admit: 2022-11-17 | Discharge: 2022-11-17 | Disposition: A | Discharge status: Home / Self Care | Payer: BC Managed Care – PPO | Source: Ambulatory Visit | Attending: General Surgery | Admitting: General Surgery

## 2022-11-17 ENCOUNTER — Encounter (HOSPITAL_COMMUNITY)
Admission: RE | Admit: 2022-11-17 | Discharge: 2022-11-17 | Disposition: A | Discharge status: Home / Self Care | Payer: BC Managed Care – PPO | Source: Ambulatory Visit | Attending: General Surgery | Admitting: General Surgery

## 2022-11-17 ENCOUNTER — Other Ambulatory Visit: Payer: Self-pay

## 2022-11-17 ENCOUNTER — Other Ambulatory Visit (HOSPITAL_COMMUNITY): Payer: Self-pay | Admitting: General Surgery

## 2022-11-17 DIAGNOSIS — I10 Essential (primary) hypertension: Secondary | ICD-10-CM | POA: Insufficient documentation

## 2022-11-17 DIAGNOSIS — D251 Intramural leiomyoma of uterus: Secondary | ICD-10-CM

## 2022-11-17 DIAGNOSIS — E559 Vitamin D deficiency, unspecified: Secondary | ICD-10-CM | POA: Insufficient documentation

## 2022-11-17 DIAGNOSIS — E611 Iron deficiency: Secondary | ICD-10-CM | POA: Diagnosis not present

## 2022-11-17 DIAGNOSIS — K219 Gastro-esophageal reflux disease without esophagitis: Secondary | ICD-10-CM | POA: Diagnosis not present

## 2022-11-17 DIAGNOSIS — Z01818 Encounter for other preprocedural examination: Secondary | ICD-10-CM | POA: Insufficient documentation

## 2022-11-24 ENCOUNTER — Encounter: Payer: BC Managed Care – PPO | Attending: General Surgery | Admitting: Dietician

## 2022-11-24 ENCOUNTER — Encounter: Payer: Self-pay | Admitting: Dietician

## 2022-11-24 VITALS — Ht 65.0 in | Wt 324.3 lb

## 2022-11-24 DIAGNOSIS — E669 Obesity, unspecified: Secondary | ICD-10-CM | POA: Diagnosis not present

## 2022-11-24 NOTE — Progress Notes (Signed)
Nutrition Assessment for Bariatric Surgery: Pre-Surgery Behavioral and Nutrition Intervention Program   Medical Nutrition Therapy  Appt Start Time: 8:05    End Time: 9:06  Patient was seen on 11/24/2022 for Pre-Operative Nutrition Assessment. Purpose of todays visit  enhance perioperative outcomes along with a healthy weight maintenance   Referral stated Supervised Weight Loss (SWL) visits needed: 0  Pt completed visits.   Pt has cleared nutrition requirements.   Planned surgery: Sleeve Gastrectomy Pt expectation of surgery: to be under 200 lbs, ultimately to be 160-180 lbs.   NUTRITION ASSESSMENT   Anthropometrics  Start weight at [facility]: 324.3 lbs (date: 11/24/2022)  Height: 65 in BMI: 53.97 kg/m2     Clinical   Pharmacotherapy: History of weight loss medication used: n/a  Medical hx: obesity, HTN Medications: amlodipine, spironolactone, metformin, cetrizine, iron gummy, multivitamin  Labs: A1c 6.1, Vit B12, iron 26, iron saturation 10, LDL 121, glucose 109, WBC 16.1 Notable signs/symptoms: none noted Any previous deficiencies? No  Evaluation of Nutritional Deficiencies: Micronutrient Nutrition Focused Physical Exam: Hair: No issues observed Eyes: No issues observed Mouth: No issues observed Neck: No issues observed Nails: No issues observed Skin: No issues observed  Lifestyle & Dietary Hx  Pt states her white blood cell count is usually high, stating that that is normal for her.  Pt states insurance would not pay for any weight loss medication. Pt states she used [facility] a few years ago, and states she started the bariatric surgery path before, but decided to try on her own.  Current Physical Activity Recommendations state 150 minutes per week of moderate to vigorous movement including Cardio and 1-2 days of resistance activities as well as flexibility/balance activities:  Pts current physical activity: ADLs, with 0% recommendation reached    Sleep Hygiene: duration and quality: varies, stating some nights it is difficult to stay asleep, getting about 5-7  Current Patient Perceived Stress Level as stated by pt on a scale of 1-10: 4-5       Stress Management Techniques: guided meditation  According to the Dietary Guidelines for Americans Recommendation: equivalent 1.5-2 cups fruits per day, equivalent 2-3 cups vegetables per day and at least half all grains whole  Fruit servings per day (on average): 0-1, meeting 0-50% recommendation  Non-starchy vegetable servings per day (on average): 2, meeting 66% recommendation  Whole Grains per day (on average): 1  Number of meals missed/skipped per week out of 21: 3  24-Hr Dietary Recall First Meal: at work, smoked sausage, instant grits or oatmeal or wheat toast with jelly Snack:  Second Meal: left overs or chick-fil-a combo Snack: chips or cookie Third Meal: pork chops with rice and gravy with broccoli Snack: ice cream Beverages: water, fruit punch  Alcoholic beverages per week: 2-3 (on the weekend)   Estimated Energy Needs Calories: 1500   NUTRITION DIAGNOSIS  Overweight/obesity ([place]-3.3) related to past poor dietary habits and physical inactivity as evidenced by patient w/ planned sleeve surgery following dietary guidelines for continued weight loss.    NUTRITION INTERVENTION  Nutrition counseling (C-1) and education (E-2) to facilitate bariatric surgery goals.  Educated pt on micronutrient deficiencies post-surgery and behavioral/dietary strategies to start in order to mitigate that risk   Behavioral and Dietary Interventions Pre-Op Goals Reviewed with the Patient Nutrition: Healthy Eating Behaviors Switch to non-caloric, non-carbonated and non-caffeinated beverages such as  water, unsweetened tea, Crystal Light and zero calorie beverages (aim for 64 oz. per day) Cut out grazing between meals or  at night  Find a protein shake you like Eat every 3-5 hours         Eliminate distractions while eating (TV, computer, reading, driving, texting) Take 16-10 minutes to eat a meal  Decrease high sugar foods/decrease high fat/fried foods Eliminate alcoholic beverages Increase protein intake (eggs, fish, chicken, yogurt) before surgery Eat non starchy vegetables 2 times a day 7 days a week Eat complex carbohydrates such as whole grains and fruits   Behavioral Modification: Physical Activity Increase my usual daily activity (use stairs, park farther, etc.) Engage in _______________________  activity  _______ minutes ______ times per week  Other:    _________________________________________________________________     Problem Solving I will think about my usual eating patterns and how to tweak them How can my friends and family support me Barriers to starting my changes Learn and understand appetite verses hunger   Healthy Coping Allow for ___________ activities per week to help me manage stress Reframe negative thoughts I will keep a picture of someone or something that is my inspiration & look at it daily   Monitoring  Weigh myself once a week  Measure my progress by monitoring how my clothes fit Keep a food record of what I eat and drink for the next ________ (time period) Take pictures of what I eat and drink for the next ________ (time period) Use an app to count steps/day for the next_______ (time period) Measure my progress such as increased energy and more restful sleep Monitor your acid reflux and bowel habits, are they getting better?   *Goals that are bolded indicate the pt would like to start working towards these  Handouts Provided Include  Bariatric Surgery handouts (Nutrition Visits, Pre Surgery Behavioral Change Goals, Protein Shakes Brands to Choose From, Vitamins & Mineral Supplementation)  Learning Style & Readiness for Change Teaching method utilized: Visual, Auditory, and hands on  Demonstrated degree of understanding via:  Teach Back  Readiness Level: preparation Barriers to learning/adherence to lifestyle change: nothing identified  RD's Notes for Next Visit    MONITORING & EVALUATION Dietary intake, weekly physical activity, body weight, and preoperative behavioral change goals   Next Steps  Pt has completed visits. No further supervised visits required/recommended. Patient is to follow up at [facility] for pre-op class >2 weeks prior to scheduled surgery.

## 2022-12-02 ENCOUNTER — Telehealth: Payer: Self-pay | Admitting: Family Medicine

## 2022-12-02 ENCOUNTER — Ambulatory Visit (INDEPENDENT_AMBULATORY_CARE_PROVIDER_SITE_OTHER): Payer: BC Managed Care – PPO | Admitting: Licensed Clinical Social Worker

## 2022-12-02 DIAGNOSIS — F4322 Adjustment disorder with anxiety: Secondary | ICD-10-CM
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
## 2022-12-02 NOTE — Telephone Encounter (Signed)
Pt want to know if you received her bariatric med paper work from [facility] and what is the update on the information/ any updates for her

## 2022-12-05 NOTE — Telephone Encounter (Signed)
Spoke with patient who verbally understood paperwork not received. Per patient she will fax or drop paperwork off at the office on Monday.

## 2022-12-08 ENCOUNTER — Encounter: Payer: Self-pay | Admitting: Family Medicine

## 2022-12-09 ENCOUNTER — Encounter: Payer: Self-pay | Admitting: Family Medicine

## 2022-12-21 ENCOUNTER — Other Ambulatory Visit: Payer: Self-pay | Admitting: Family Medicine

## 2022-12-26 ENCOUNTER — Encounter (HOSPITAL_COMMUNITY): Payer: Self-pay

## 2022-12-31 ENCOUNTER — Ambulatory Visit: Payer: BC Managed Care – PPO | Admitting: Obstetrics & Gynecology

## 2022-12-31 ENCOUNTER — Encounter: Payer: Self-pay | Admitting: Obstetrics & Gynecology

## 2022-12-31 VITALS — BP 121/85 | HR 76 | Wt 326.0 lb

## 2022-12-31 DIAGNOSIS — Z3202 Encounter for pregnancy test, result negative: Secondary | ICD-10-CM

## 2022-12-31 DIAGNOSIS — N939 Abnormal uterine and vaginal bleeding, unspecified: Secondary | ICD-10-CM

## 2022-12-31 NOTE — Progress Notes (Signed)
History:  37 y.o. [id number] here today for AUB. Pt is schuedled for gastric sleeve in July 2024.  She is here for f/u of AUB. She was having cycles every 2 weeks for 2 months. Her cycles have improved and are monthly. The bleeding has not been heavy. Her last cycle was very light. She has a dx of PCOS. Bleeding does not limit ADLs.   The following portions of the patient's history were reviewed and updated as appropriate: allergies, current medications, past family history, past medical history, past social history, past surgical history and problem list.  Review of Systems:  Pertinent items are noted in HPI.    Objective:  Physical Exam BP 121/85   Pulse 76   Wt (!) 326 lb (147.9 kg)   LMP 12/26/2022 (Exact Date)   BMI 54.25 kg/m    CONSTITUTIONAL: Well-developed, well-nourished female in no acute distress.  HENT:  Normocephalic, atraumatic EYES: Conjunctivae and EOM are normal. No scleral icterus.  NECK: Normal range of motion SKIN: Skin is warm and dry. No rash noted. Not diaphoretic.No pallor. NEUROLGIC: Alert and oriented to person, place, and time. Normal coordination.  Pelvic: not done.     Labs and Imaging 10/10/2022 CLINICAL DATA:  Abnormal uterine bleeding.   EXAM: TRANSABDOMINAL AND TRANSVAGINAL ULTRASOUND OF PELVIS   TECHNIQUE: Both transabdominal and transvaginal ultrasound examinations of the pelvis were performed. Transabdominal technique was performed for global imaging of the pelvis including uterus, ovaries, adnexal regions, and pelvic cul-de-sac. It was necessary to proceed with endovaginal exam following the transabdominal exam to visualize the endometrium.   COMPARISON:  Ultrasound pelvis 03/25/2021   FINDINGS: Uterus   Measurements: 9.8 x 4.3 x 4.7 cm = volume: 102 mL. There is a 1.9 x 7 x 1.8 cm intramural fibroid anterior uterine body.   Endometrium   Thickness: 12 mm.  No focal abnormality visualized.   Right ovary   Measurements: 2.9 x 2.0  x 2.3 cm = volume: 6.7 mL. Normal appearance/no adnexal mass.   Left ovary   Measurements: 4.3 x 2.0 x 3.6 cm = volume: 16 mL. Normal appearance/no adnexal mass.   Other findings   No abnormal free fluid.   IMPRESSION: Endometrium measures 12 mm. If bleeding remains unresponsive to hormonal or medical therapy, sonohysterogram should be considered for focal lesion work-up. (Ref: Radiological Reasoning: Algorithmic Workup of Abnormal Vaginal Bleeding with Endovaginal Sonography and Sonohysterography. AJR 2008; 478:G95-62)   Fibroid uterus.      Assessment & Plan:  Irreg cycles in pt with h/o PCOS  F/u if cycles worsen.  Reviewed diet and exercise (strength training)   Total face-to-face time with patient, review of chart, and coordination of care was .    Nadiyah Zeis L. Harraway-Smith, M.D., [place]

## 2023-01-05 ENCOUNTER — Encounter: Payer: Self-pay | Admitting: Skilled Nursing Facility1

## 2023-01-05 ENCOUNTER — Encounter: Payer: BC Managed Care – PPO | Attending: General Surgery | Admitting: Skilled Nursing Facility1

## 2023-01-05 VITALS — Wt 327.9 lb

## 2023-01-05 DIAGNOSIS — Z6841 Body Mass Index (BMI) 40.0 and over, adult: Secondary | ICD-10-CM | POA: Diagnosis not present

## 2023-01-05 NOTE — Progress Notes (Signed)
Pre-Operative Nutrition Class:    Patient was seen on 01/05/2023 for Pre-Operative Bariatric Surgery Education [facility]    Surgery date: 01/27/2023 Surgery type: sleeve Start weight at [facility]: 324.3 Weight today: 327.9   The following the learning objectives were met by the patient during this course: Identify Pre-Op Dietary Goals and will begin 2 weeks pre-operatively Identify appropriate sources of fluids and proteins  State protein recommendations and appropriate sources pre and post-operatively Identify Post-Operative Dietary Goals and will follow for 2 weeks post-operatively Identify appropriate multivitamin and calcium sources Describe the need for physical activity post-operatively and will follow MD recommendations State when to call healthcare provider regarding medication questions or post-operative complications When having a diagnosis of diabetes understanding hypoglycemia symptoms and the inclusion of 1 complex carbohydrate per meal  Handouts given during class include: Pre-Op Bariatric Surgery Diet Handout Protein Shake Handout Post-Op Bariatric Surgery Nutrition Handout BELT Program Information Flyer Support Group Information Flyer [facility] Bariatric Supplements Price List  Follow-Up Plan: Patient will follow-up at [facility] 2 weeks post operatively for diet advancement per MD.

## 2023-01-07 ENCOUNTER — Ambulatory Visit: Payer: Self-pay | Admitting: General Surgery

## 2023-01-07 NOTE — Progress Notes (Signed)
Sent message, via epic in basket, requesting orders in epic from surgeon.  

## 2023-01-12 NOTE — Patient Instructions (Signed)
SURGICAL WAITING ROOM VISITATION Patients having surgery or a procedure may have no more than 2 support people in the waiting area - these visitors may rotate.    Children under the age of 60 must have an adult with them who is not the patient.  If the patient needs to stay at the hospital during part of their recovery, the visitor guidelines for inpatient rooms apply. Pre-op nurse will coordinate an appropriate time for 1 support person to accompany patient in pre-op.  This support person may not rotate.    Please refer to the [facility] website for the visitor guidelines for Inpatients (after your surgery is over and you are in a regular room).       Your procedure is scheduled on: 01-27-23   Report to [facility] Main Entrance    Report to admitting at 7:45 AM   Call this number if you have problems the morning of surgery [phone]   Do not eat food :After 6:00 PM the night before surgery.   After Midnight you may have the following liquids until 7:00 AM DAY OF SURGERY  Water Non-Citrus Juices (without pulp, NO RED-Apple, White grape, White cranberry) Black Coffee (NO MILK/CREAM OR CREAMERS, sugar ok)  Clear Tea (NO MILK/CREAM OR CREAMERS, sugar ok) regular and decaf                             Plain Jell-O (NO RED)                                           Fruit ices (not with fruit pulp, NO RED)                                     Popsicles (NO RED)                                                               Sports drinks like Gatorade (NO RED)                   The day of surgery:  Drink ONE (1) Pre-Surgery Clear G2 at 7:00 AM the morning of surgery. Drink in one sitting. Do not sip.  This drink was given to you during your hospital  pre-op appointment visit. Nothing else to drink after completing the Pre-Surgery G2.          If you have questions, please contact your surgeon's office.   FOLLOW  ANY ADDITIONAL PRE OP INSTRUCTIONS YOU RECEIVED FROM YOUR  SURGEON'S OFFICE!!!     Oral Hygiene is also important to reduce your risk of infection.                                    Remember - BRUSH YOUR TEETH THE MORNING OF SURGERY WITH YOUR REGULAR TOOTHPASTE   Do NOT smoke after Midnight   Take these medicines the morning of surgery with A SIP OF WATER:   Amlodipine  Zyrtec  Bring CPAP mask and tubing day of surgery.                              You may not have any metal on your body including hair pins, jewelry, and body piercing             Do not wear make-up, lotions, powders, perfumes or deodorant  Do not wear nail polish including gel and S&S, artificial/acrylic nails, or any other type of covering on natural nails including finger and toenails. If you have artificial nails, gel coating, etc. that needs to be removed by a nail salon please have this removed prior to surgery or surgery may need to be canceled/ delayed if the surgeon/ anesthesia feels like they are unable to be safely monitored.   Do not shave  48 hours prior to surgery.    Do not bring valuables to the hospital. [facility] IS NOT RESPONSIBLE   FOR VALUABLES.   Contacts, dentures or bridgework may not be worn into surgery.   Bring small overnight bag day of surgery.   DO NOT BRING YOUR HOME MEDICATIONS TO THE HOSPITAL. PHARMACY WILL DISPENSE MEDICATIONS LISTED ON YOUR MEDICATION LIST TO YOU DURING YOUR ADMISSION IN THE HOSPITAL!   Special Instructions: Bring a copy of your healthcare power of attorney and living will documents the day of surgery if you haven't scanned them before.              Please read over the following fact sheets you were given: IF YOU HAVE QUESTIONS ABOUT YOUR PRE-OP INSTRUCTIONS PLEASE CALL [phone] [place]  If you received a COVID test during your pre-op visit  it is requested that you wear a mask when out in public, stay away from anyone that may not be feeling well and notify your surgeon if you develop symptoms. If you test positive  for Covid or have been in contact with anyone that has tested positive in the last 10 days please notify you surgeon.   - Preparing for Surgery Before surgery, you can play an important role.  Because skin is not sterile, your skin needs to be as free of germs as possible.  You can reduce the number of germs on your skin by washing with CHG (chlorahexidine gluconate) soap before surgery.  CHG is an antiseptic cleaner which kills germs and bonds with the skin to continue killing germs even after washing. Please DO NOT use if you have an allergy to CHG or antibacterial soaps.  If your skin becomes reddened/irritated stop using the CHG and inform your nurse when you arrive at Short Stay. Do not shave (including legs and underarms) for at least 48 hours prior to the first CHG shower.  You may shave your face/neck.  Please follow these instructions carefully:  1.  Shower with CHG Soap the night before surgery and the  morning of surgery.  2.  If you choose to wash your hair, wash your hair first as usual with your normal  shampoo.  3.  After you shampoo, rinse your hair and body thoroughly to remove the shampoo.                             4.  Use CHG as you would any other liquid soap.  You can apply chg directly to the skin and wash.  Gently with  a scrungie or clean washcloth.  5.  Apply the CHG Soap to your body ONLY FROM THE NECK DOWN.   Do   not use on face/ open                           Wound or open sores. Avoid contact with eyes, ears mouth and   genitals (private parts).                       Wash face,  Genitals (private parts) with your normal soap.             6.  Wash thoroughly, paying special attention to the area where your    surgery  will be performed.  7.  Thoroughly rinse your body with warm water from the neck down.  8.  DO NOT shower/wash with your normal soap after using and rinsing off the CHG Soap.                9.  Pat yourself dry with a clean towel.             10.  Wear clean pajamas.            11.  Place clean sheets on your bed the night of your first shower and do not  sleep with pets. Day of Surgery : Do not apply any lotions/deodorants the morning of surgery.  Please wear clean clothes to the hospital/surgery center.  FAILURE TO FOLLOW THESE INSTRUCTIONS MAY RESULT IN THE CANCELLATION OF YOUR SURGERY  PATIENT SIGNATURE_________________________________  NURSE SIGNATURE__________________________________  ________________________________________________________________________    Courtney Mooney  An incentive spirometer is a tool that can help keep your lungs clear and active. This tool measures how well you are filling your lungs with each breath. Taking long deep breaths may help reverse or decrease the chance of developing breathing (pulmonary) problems (especially infection) following: A long period of time when you are unable to move or be active. BEFORE THE PROCEDURE  If the spirometer includes an indicator to show your best effort, your nurse or respiratory therapist will set it to a desired goal. If possible, sit up straight or lean slightly forward. Try not to slouch. Hold the incentive spirometer in an upright position. INSTRUCTIONS FOR USE  Sit on the edge of your bed if possible, or sit up as far as you can in bed or on a chair. Hold the incentive spirometer in an upright position. Breathe out normally. Place the mouthpiece in your mouth and seal your lips tightly around it. Breathe in slowly and as deeply as possible, raising the piston or the ball toward the top of the column. Hold your breath for 3-5 seconds or for as long as possible. Allow the piston or ball to fall to the bottom of the column. Remove the mouthpiece from your mouth and breathe out normally. Rest for a few seconds and repeat Steps 1 through 7 at least 10 times every 1-2 hours when you are awake. Take your time and take a few normal breaths between deep  breaths. The spirometer may include an indicator to show your best effort. Use the indicator as a goal to work toward during each repetition. After each set of 10 deep breaths, practice coughing to be sure your lungs are clear. If you have an incision (the cut made at the time of surgery), support your incision when coughing by placing  a pillow or rolled up towels firmly against it. Once you are able to get out of bed, walk around indoors and cough well. You may stop using the incentive spirometer when instructed by your caregiver.  RISKS AND COMPLICATIONS Take your time so you do not get dizzy or light-headed. If you are in pain, you may need to take or ask for pain medication before doing incentive spirometry. It is harder to take a deep breath if you are having pain. AFTER USE Rest and breathe slowly and easily. It can be helpful to keep track of a log of your progress. Your caregiver can provide you with a simple table to help with this. If you are using the spirometer at home, follow these instructions: SEEK MEDICAL CARE IF:  You are having difficultly using the spirometer. You have trouble using the spirometer as often as instructed. Your pain medication is not giving enough relief while using the spirometer. You develop fever of 100.5 F (38.1 C) or higher. SEEK IMMEDIATE MEDICAL CARE IF:  You cough up bloody sputum that had not been present before. You develop fever of 102 F (38.9 C) or greater. You develop worsening pain at or near the incision site. MAKE SURE YOU:  Understand these instructions. Will watch your condition. Will get help right away if you are not doing well or get worse. Document Released: 11/24/2006 Document Revised: 10/06/2011 Document Reviewed: 01/25/2007 Exit[employer] Patient Information 2014 ExitCare, [place].   ________________________________________________________________________ WHAT IS A BLOOD TRANSFUSION? Blood Transfusion Information  A transfusion is  the replacement of blood or some of its parts. Blood is made up of multiple cells which provide different functions. Red blood cells carry oxygen and are used for blood loss replacement. White blood cells fight against infection. Platelets control bleeding. Plasma helps clot blood. Other blood products are available for specialized needs, such as hemophilia or other clotting disorders. BEFORE THE TRANSFUSION  Who gives blood for transfusions?  Healthy volunteers who are fully evaluated to make sure their blood is safe. This is blood bank blood. Transfusion therapy is the safest it has ever been in the practice of medicine. Before blood is taken from a donor, a complete history is taken to make sure that person has no history of diseases nor engages in risky social behavior (examples are intravenous drug use or sexual activity with multiple partners). The donor's travel history is screened to minimize risk of transmitting infections, such as malaria. The donated blood is tested for signs of infectious diseases, such as HIV and hepatitis. The blood is then tested to be sure it is compatible with you in order to minimize the chance of a transfusion reaction. If you or a relative donates blood, this is often done in anticipation of surgery and is not appropriate for emergency situations. It takes many days to process the donated blood. RISKS AND COMPLICATIONS Although transfusion therapy is very safe and saves many lives, the main dangers of transfusion include:  Getting an infectious disease. Developing a transfusion reaction. This is an allergic reaction to something in the blood you were given. Every precaution is taken to prevent this. The decision to have a blood transfusion has been considered carefully by your caregiver before blood is given. Blood is not given unless the benefits outweigh the risks. AFTER THE TRANSFUSION Right after receiving a blood transfusion, you will usually feel much better  and more energetic. This is especially true if your red blood cells have gotten low (anemic). The transfusion  raises the level of the red blood cells which carry oxygen, and this usually causes an energy increase. The nurse administering the transfusion will monitor you carefully for complications. HOME CARE INSTRUCTIONS  No special instructions are needed after a transfusion. You may find your energy is better. Speak with your caregiver about any limitations on activity for underlying diseases you may have. SEEK MEDICAL CARE IF:  Your condition is not improving after your transfusion. You develop redness or irritation at the intravenous (IV) site. SEEK IMMEDIATE MEDICAL CARE IF:  Any of the following symptoms occur over the next 12 hours: Shaking chills. You have a temperature by mouth above 102 F (38.9 C), not controlled by medicine. Chest, back, or muscle pain. People around you feel you are not acting correctly or are confused. Shortness of breath or difficulty breathing. Dizziness and fainting. You get a rash or develop hives. You have a decrease in urine output. Your urine turns a dark color or changes to pink, red, or brown. Any of the following symptoms occur over the next 10 days: You have a temperature by mouth above 102 F (38.9 C), not controlled by medicine. Shortness of breath. Weakness after normal activity. The white part of the eye turns yellow (jaundice). You have a decrease in the amount of urine or are urinating less often. Your urine turns a dark color or changes to pink, red, or brown. Document Released: 07/11/2000 Document Revised: 10/06/2011 Document Reviewed: 02/28/2008 [facility] Patient Information 2014 [place], [place].  _______________________________________________________________________

## 2023-01-12 NOTE — Progress Notes (Signed)
COVID Vaccine Completed:  Yes  Date of COVID positive in last 90 days:  PCP - Nadene Rubins, MD Cardiologist -   Chest x-ray - 11-17-22 Epic EKG - 11-17-22 Epic Stress Test -  ECHO -  Cardiac Cath -  Pacemaker/ICD device last checked: Spinal Cord Stimulator:  Bowel Prep -   Sleep Study -  CPAP -   Fasting Blood Sugar -  Checks Blood Sugar _____ times a day  Last dose of GLP1 agonist-  N/A GLP1 instructions:  N/A   Last dose of SGLT-2 inhibitors-  N/A SGLT-2 instructions: N/A   Blood Thinner Instructions:  Time Aspirin Instructions: Last Dose:  Activity level:  Can go up a flight of stairs and perform activities of daily living without stopping and without symptoms of chest pain or shortness of breath.  Able to exercise without symptoms  Unable to go up a flight of stairs without symptoms of     Anesthesia review:   Patient denies shortness of breath, fever, cough and chest pain at PAT appointment  Patient verbalized understanding of instructions that were given to them at the PAT appointment. Patient was also instructed that they will need to review over the PAT instructions again at home before surgery.

## 2023-01-14 ENCOUNTER — Encounter (HOSPITAL_COMMUNITY): Payer: Self-pay

## 2023-01-14 ENCOUNTER — Other Ambulatory Visit: Payer: Self-pay

## 2023-01-14 ENCOUNTER — Encounter (HOSPITAL_COMMUNITY)
Admission: RE | Admit: 2023-01-14 | Discharge: 2023-01-14 | Disposition: A | Discharge status: Home / Self Care | Payer: BC Managed Care – PPO | Source: Ambulatory Visit | Attending: General Surgery | Admitting: General Surgery

## 2023-01-14 DIAGNOSIS — Z6841 Body Mass Index (BMI) 40.0 and over, adult: Secondary | ICD-10-CM | POA: Insufficient documentation

## 2023-01-14 DIAGNOSIS — Z01812 Encounter for preprocedural laboratory examination: Secondary | ICD-10-CM | POA: Insufficient documentation

## 2023-01-14 HISTORY — DX: Prediabetes: R73.03

## 2023-01-14 LAB — CBC WITH DIFFERENTIAL/PLATELET
Abs Immature Granulocytes: 0.04 10*3/uL (ref 0.00–0.07)
Basophils Absolute: 0 10*3/uL (ref 0.0–0.1)
Basophils Relative: 0 %
Eosinophils Absolute: 0.1 10*3/uL (ref 0.0–0.5)
Eosinophils Relative: 1 %
HCT: 40.6 % (ref 36.0–46.0)
Hemoglobin: 12.5 g/dL (ref 12.0–15.0)
Immature Granulocytes: 0 %
Lymphocytes Relative: 20 %
Lymphs Abs: 2.6 10*3/uL (ref 0.7–4.0)
MCH: 26.3 pg (ref 26.0–34.0)
MCHC: 30.8 g/dL (ref 30.0–36.0)
MCV: 85.5 fL (ref 80.0–100.0)
Monocytes Absolute: 0.8 10*3/uL (ref 0.1–1.0)
Monocytes Relative: 6 %
Neutro Abs: 9.6 10*3/uL — ABNORMAL HIGH (ref 1.7–7.7)
Neutrophils Relative %: 73 %
Platelets: 380 10*3/uL (ref 150–400)
RBC: 4.75 MIL/uL (ref 3.87–5.11)
RDW: 13.3 % (ref 11.5–15.5)
WBC: 13.2 10*3/uL — ABNORMAL HIGH (ref 4.0–10.5)
nRBC: 0 % (ref 0.0–0.2)

## 2023-01-14 LAB — COMPREHENSIVE METABOLIC PANEL
ALT: 18 U/L (ref 0–44)
AST: 19 U/L (ref 15–41)
Albumin: 3.5 g/dL (ref 3.5–5.0)
Alkaline Phosphatase: 91 U/L (ref 38–126)
Anion gap: 7 (ref 5–15)
BUN: 21 mg/dL — ABNORMAL HIGH (ref 6–20)
CO2: 25 mmol/L (ref 22–32)
Calcium: 8.9 mg/dL (ref 8.9–10.3)
Chloride: 103 mmol/L (ref 98–111)
Creatinine, Ser: 0.78 mg/dL (ref 0.44–1.00)
GFR, Estimated: >60 mL/min (ref >60)
Glucose, Bld: 95 mg/dL (ref 70–99)
Potassium: 3.8 mmol/L (ref 3.5–5.1)
Sodium: 135 mmol/L (ref 135–145)
Total Bilirubin: 0.6 mg/dL (ref 0.3–1.2)
Total Protein: 7.5 g/dL (ref 6.5–8.1)

## 2023-01-14 LAB — NO BLOOD PRODUCTS

## 2023-01-14 NOTE — Progress Notes (Signed)
COVID Vaccine Completed:  Yes   Date of COVID positive in last 90 days: no   PCP - Nadene Rubins, MD Cardiologist - n/a   Chest x-ray - 11-17-22 Epic EKG - 11-17-22 Epic Stress Test - n/a ECHO - n/a Cardiac Cath - n/a Pacemaker/ICD device last checked: n/a Spinal Cord Stimulator: n/a   Bowel Prep - no solid food after 6pm night before   Sleep Study - n/a CPAP -    Fasting Blood Sugar - preDM Checks Blood Sugar no checks at home, no no Metformin in last month   Last dose of GLP1 agonist-  N/A GLP1 instructions:  N/A   Last dose of SGLT-2 inhibitors-  N/A SGLT-2 instructions: N/A     Blood Thinner Instructions:  n/a Aspirin Instructions: Last Dose:   Activity level:   Can go up a flight of stairs and perform activities of daily living without stopping and without symptoms of chest pain or shortness of breath.                        Anesthesia review:    Patient denies shortness of breath, fever, cough and chest pain at PAT appointment   Patient verbalized understanding of instructions that were given to them at the PAT appointment. Patient was also instructed that they will need to review over the PAT instructions again at home before surgery

## 2023-01-19 ENCOUNTER — Encounter: Payer: Self-pay | Admitting: Obstetrics & Gynecology

## 2023-01-19 LAB — POCT URINE PREGNANCY: Preg Test, Ur: NEGATIVE

## 2023-01-20 ENCOUNTER — Encounter: Payer: Self-pay | Admitting: Obstetrics & Gynecology

## 2023-01-21 DIAGNOSIS — E611 Iron deficiency: Secondary | ICD-10-CM | POA: Diagnosis not present

## 2023-01-21 DIAGNOSIS — I10 Essential (primary) hypertension: Secondary | ICD-10-CM | POA: Diagnosis not present

## 2023-01-27 ENCOUNTER — Encounter (HOSPITAL_COMMUNITY): Payer: Self-pay | Admitting: General Surgery

## 2023-01-27 ENCOUNTER — Other Ambulatory Visit: Payer: Self-pay

## 2023-01-27 ENCOUNTER — Inpatient Hospital Stay (HOSPITAL_COMMUNITY): Payer: BC Managed Care – PPO | Admitting: Anesthesiology

## 2023-01-27 ENCOUNTER — Encounter (HOSPITAL_COMMUNITY)
Admission: RE | Disposition: A | Discharge status: Home / Self Care | Payer: Self-pay | Source: Home / Self Care | Attending: General Surgery

## 2023-01-27 ENCOUNTER — Inpatient Hospital Stay (HOSPITAL_COMMUNITY)
Admission: RE | Admit: 2023-01-27 | Discharge: 2023-01-28 | DRG: 621 | Disposition: A | Discharge status: Home / Self Care | Payer: BC Managed Care – PPO | Attending: General Surgery | Admitting: General Surgery

## 2023-01-27 DIAGNOSIS — Z8249 Family history of ischemic heart disease and other diseases of the circulatory system: Secondary | ICD-10-CM | POA: Diagnosis not present

## 2023-01-27 DIAGNOSIS — Z8349 Family history of other endocrine, nutritional and metabolic diseases: Secondary | ICD-10-CM

## 2023-01-27 DIAGNOSIS — Z7984 Long term (current) use of oral hypoglycemic drugs: Secondary | ICD-10-CM | POA: Diagnosis not present

## 2023-01-27 DIAGNOSIS — Z833 Family history of diabetes mellitus: Secondary | ICD-10-CM

## 2023-01-27 DIAGNOSIS — I10 Essential (primary) hypertension: Secondary | ICD-10-CM | POA: Diagnosis present

## 2023-01-27 DIAGNOSIS — E119 Type 2 diabetes mellitus without complications: Secondary | ICD-10-CM | POA: Diagnosis not present

## 2023-01-27 DIAGNOSIS — Z6841 Body Mass Index (BMI) 40.0 and over, adult: Secondary | ICD-10-CM | POA: Diagnosis not present

## 2023-01-27 DIAGNOSIS — Z79899 Other long term (current) drug therapy: Secondary | ICD-10-CM

## 2023-01-27 DIAGNOSIS — E611 Iron deficiency: Secondary | ICD-10-CM | POA: Diagnosis not present

## 2023-01-27 DIAGNOSIS — F419 Anxiety disorder, unspecified: Secondary | ICD-10-CM | POA: Diagnosis present

## 2023-01-27 HISTORY — PX: LAPAROSCOPIC GASTRIC SLEEVE RESECTION: SHX5895

## 2023-01-27 HISTORY — PX: UPPER GI ENDOSCOPY: SHX6162

## 2023-01-27 LAB — CBC
HCT: 42.3 % (ref 36.0–46.0)
Hemoglobin: 13.1 g/dL (ref 12.0–15.0)
MCH: 27 pg (ref 26.0–34.0)
MCHC: 31 g/dL (ref 30.0–36.0)
MCV: 87 fL (ref 80.0–100.0)
Platelets: 418 10*3/uL — ABNORMAL HIGH (ref 150–400)
RBC: 4.86 MIL/uL (ref 3.87–5.11)
RDW: 13.6 % (ref 11.5–15.5)
WBC: 26.5 10*3/uL — ABNORMAL HIGH (ref 4.0–10.5)
nRBC: 0 % (ref 0.0–0.2)

## 2023-01-27 LAB — POCT PREGNANCY, URINE: Preg Test, Ur: NEGATIVE

## 2023-01-27 LAB — CREATININE, SERUM
Creatinine, Ser: 0.95 mg/dL (ref 0.44–1.00)
GFR, Estimated: >60 mL/min (ref >60)

## 2023-01-27 LAB — GLUCOSE, CAPILLARY: Glucose-Capillary: 146 mg/dL — ABNORMAL HIGH (ref 70–99)

## 2023-01-27 LAB — HEMOGLOBIN AND HEMATOCRIT, BLOOD
HCT: 42.4 % (ref 36.0–46.0)
Hemoglobin: 13.3 g/dL (ref 12.0–15.0)

## 2023-01-27 SURGERY — GASTRECTOMY, SLEEVE, LAPAROSCOPIC
Anesthesia: General

## 2023-01-27 MED ORDER — LORATADINE 10 MG PO TABS
10.0000 mg | ORAL_TABLET | Freq: Every day | ORAL | Status: DC
  Administered 2023-01-28: 10 mg via ORAL
  Filled 2023-01-27: qty 1

## 2023-01-27 MED ORDER — ACETAMINOPHEN 10 MG/ML IV SOLN
INTRAVENOUS | Status: AC
  Administered 2023-01-27: 1000 mg via INTRAVENOUS
  Filled 2023-01-27: qty 100

## 2023-01-27 MED ORDER — ACETAMINOPHEN 160 MG/5ML PO SOLN: 1000.0000 mg | Freq: Three times a day (TID) | ORAL | Status: DC

## 2023-01-27 MED ORDER — MORPHINE SULFATE (PF) 2 MG/ML IV SOLN
INTRAVENOUS | Status: AC
  Administered 2023-01-27: 2 mg via INTRAVENOUS
  Filled 2023-01-27: qty 1

## 2023-01-27 MED ORDER — SIMETHICONE 80 MG PO CHEW
80.0000 mg | CHEWABLE_TABLET | Freq: Four times a day (QID) | ORAL | Status: DC | PRN
  Administered 2023-01-27 – 2023-01-28 (×3): 80 mg via ORAL
  Filled 2023-01-27 (×5): qty 1

## 2023-01-27 MED ORDER — FENTANYL CITRATE PF 50 MCG/ML IJ SOSY
PREFILLED_SYRINGE | INTRAMUSCULAR | Status: AC
  Administered 2023-01-27: 50 ug via INTRAVENOUS
  Filled 2023-01-27: qty 3

## 2023-01-27 MED ORDER — FENTANYL CITRATE PF 50 MCG/ML IJ SOSY
25.0000 ug | PREFILLED_SYRINGE | INTRAMUSCULAR | Status: DC | PRN
  Administered 2023-01-27 (×2): 50 ug via INTRAVENOUS

## 2023-01-27 MED ORDER — SCOPOLAMINE 1 MG/3DAYS TD PT72: 1.0000 | MEDICATED_PATCH | Freq: Once | TRANSDERMAL | Status: DC

## 2023-01-27 MED ORDER — ONDANSETRON HCL 4 MG/2ML IJ SOLN
INTRAMUSCULAR | Status: DC | PRN
  Administered 2023-01-27: 4 mg via INTRAVENOUS

## 2023-01-27 MED ORDER — BUPIVACAINE LIPOSOME 1.3 % IJ SUSP
INTRAMUSCULAR | Status: DC | PRN
  Administered 2023-01-27: 50 mL

## 2023-01-27 MED ORDER — DEXAMETHASONE SODIUM PHOSPHATE 4 MG/ML IJ SOLN: 4.0000 mg | INTRAMUSCULAR | Status: DC

## 2023-01-27 MED ORDER — STERILE WATER FOR IRRIGATION IR SOLN
Status: DC | PRN
  Administered 2023-01-27: 1000 mL
  Administered 2023-01-27: 500 mL

## 2023-01-27 MED ORDER — LACTATED RINGERS IR SOLN
Status: DC | PRN
  Administered 2023-01-27: 1000 mL

## 2023-01-27 MED ORDER — MIDAZOLAM HCL 2 MG/2ML IJ SOLN
INTRAMUSCULAR | Status: DC | PRN
  Administered 2023-01-27: 2 mg via INTRAVENOUS

## 2023-01-27 MED ORDER — LIDOCAINE 2% (20 MG/ML) 5 ML SYRINGE
INTRAMUSCULAR | Status: DC | PRN
  Administered 2023-01-27: 100 mg via INTRAVENOUS

## 2023-01-27 MED ORDER — DEXAMETHASONE SODIUM PHOSPHATE 10 MG/ML IJ SOLN
INTRAMUSCULAR | Status: AC
  Filled 2023-01-27: qty 1

## 2023-01-27 MED ORDER — ACETAMINOPHEN 500 MG PO TABS
1000.0000 mg | ORAL_TABLET | ORAL | Status: AC
  Administered 2023-01-27: 1000 mg via ORAL
  Filled 2023-01-27: qty 2

## 2023-01-27 MED ORDER — ROCURONIUM BROMIDE 10 MG/ML (PF) SYRINGE
PREFILLED_SYRINGE | INTRAVENOUS | Status: AC
  Filled 2023-01-27: qty 10

## 2023-01-27 MED ORDER — HYDRALAZINE HCL 20 MG/ML IJ SOLN
5.0000 mg | INTRAMUSCULAR | Status: DC | PRN
  Administered 2023-01-27: 5 mg via INTRAVENOUS

## 2023-01-27 MED ORDER — FENTANYL CITRATE (PF) 250 MCG/5ML IJ SOLN
INTRAMUSCULAR | Status: AC
  Filled 2023-01-27: qty 5

## 2023-01-27 MED ORDER — HYDRALAZINE HCL 20 MG/ML IJ SOLN
INTRAMUSCULAR | Status: AC
  Filled 2023-01-27: qty 1

## 2023-01-27 MED ORDER — ORAL CARE MOUTH RINSE: 15.0000 mL | Freq: Once | OROMUCOSAL | Status: AC

## 2023-01-27 MED ORDER — FENTANYL CITRATE (PF) 250 MCG/5ML IJ SOLN
INTRAMUSCULAR | Status: DC | PRN
  Administered 2023-01-27: 50 ug via INTRAVENOUS
  Administered 2023-01-27: 100 ug via INTRAVENOUS
  Administered 2023-01-27 (×2): 50 ug via INTRAVENOUS

## 2023-01-27 MED ORDER — ONDANSETRON HCL 4 MG/2ML IJ SOLN
INTRAMUSCULAR | Status: AC
  Filled 2023-01-27: qty 2

## 2023-01-27 MED ORDER — LACTATED RINGERS IV SOLN: INTRAVENOUS | Status: DC

## 2023-01-27 MED ORDER — MORPHINE SULFATE (PF) 2 MG/ML IV SOLN: 1.0000 mg | INTRAVENOUS | Status: DC | PRN

## 2023-01-27 MED ORDER — SCOPOLAMINE 1 MG/3DAYS TD PT72
1.0000 | MEDICATED_PATCH | TRANSDERMAL | Status: DC
  Administered 2023-01-27: 1.5 mg via TRANSDERMAL
  Filled 2023-01-27: qty 1

## 2023-01-27 MED ORDER — APREPITANT 40 MG PO CAPS
40.0000 mg | ORAL_CAPSULE | ORAL | Status: AC
  Administered 2023-01-27: 40 mg via ORAL
  Filled 2023-01-27: qty 1

## 2023-01-27 MED ORDER — HYDRALAZINE HCL 20 MG/ML IJ SOLN: 10.0000 mg | INTRAMUSCULAR | Status: DC | PRN

## 2023-01-27 MED ORDER — ACETAMINOPHEN 325 MG PO TABS: 325.0000 mg | ORAL_TABLET | ORAL | Status: DC | PRN

## 2023-01-27 MED ORDER — ACETAMINOPHEN 500 MG PO TABS
1000.0000 mg | ORAL_TABLET | Freq: Three times a day (TID) | ORAL | Status: DC
  Administered 2023-01-27 – 2023-01-28 (×2): 1000 mg via ORAL
  Filled 2023-01-27 (×2): qty 2

## 2023-01-27 MED ORDER — OXYCODONE HCL 5 MG/5ML PO SOLN: 5.0000 mg | Freq: Once | ORAL | Status: AC | PRN

## 2023-01-27 MED ORDER — HEPARIN SODIUM (PORCINE) 5000 UNIT/ML IJ SOLN
5000.0000 [IU] | Freq: Three times a day (TID) | INTRAMUSCULAR | Status: DC
  Administered 2023-01-27 – 2023-01-28 (×2): 5000 [IU] via SUBCUTANEOUS
  Filled 2023-01-27 (×2): qty 1

## 2023-01-27 MED ORDER — OXYCODONE HCL 5 MG/5ML PO SOLN
5.0000 mg | Freq: Four times a day (QID) | ORAL | Status: DC | PRN
  Administered 2023-01-27 – 2023-01-28 (×3): 5 mg via ORAL
  Filled 2023-01-27 (×3): qty 5

## 2023-01-27 MED ORDER — CHLORHEXIDINE GLUCONATE 0.12 % MT SOLN
15.0000 mL | Freq: Once | OROMUCOSAL | Status: AC
  Administered 2023-01-27: 15 mL via OROMUCOSAL

## 2023-01-27 MED ORDER — CHLORHEXIDINE GLUCONATE CLOTH 2 % EX PADS: 6.0000 | MEDICATED_PAD | Freq: Once | CUTANEOUS | Status: DC

## 2023-01-27 MED ORDER — AMISULPRIDE (ANTIEMETIC) 5 MG/2ML IV SOLN: 10.0000 mg | Freq: Once | INTRAVENOUS | Status: DC | PRN

## 2023-01-27 MED ORDER — SODIUM CHLORIDE 0.9 % IV SOLN
2.0000 g | INTRAVENOUS | Status: AC
  Administered 2023-01-27: 2 g via INTRAVENOUS
  Filled 2023-01-27: qty 2

## 2023-01-27 MED ORDER — BUPIVACAINE LIPOSOME 1.3 % IJ SUSP
INTRAMUSCULAR | Status: AC
  Filled 2023-01-27: qty 20

## 2023-01-27 MED ORDER — HEPARIN SODIUM (PORCINE) 5000 UNIT/ML IJ SOLN
5000.0000 [IU] | INTRAMUSCULAR | Status: AC
  Administered 2023-01-27: 5000 [IU] via SUBCUTANEOUS
  Filled 2023-01-27: qty 1

## 2023-01-27 MED ORDER — PROMETHAZINE HCL 25 MG/ML IJ SOLN: 6.2500 mg | INTRAMUSCULAR | Status: DC | PRN

## 2023-01-27 MED ORDER — FAMOTIDINE IN NACL 20-0.9 MG/50ML-% IV SOLN
20.0000 mg | Freq: Two times a day (BID) | INTRAVENOUS | Status: DC
  Administered 2023-01-27 – 2023-01-28 (×2): 20 mg via INTRAVENOUS
  Filled 2023-01-27 (×2): qty 50

## 2023-01-27 MED ORDER — INSULIN ASPART 100 UNIT/ML IJ SOLN: 0.0000 [IU] | Freq: Three times a day (TID) | INTRAMUSCULAR | Status: DC

## 2023-01-27 MED ORDER — ONDANSETRON HCL 4 MG/2ML IJ SOLN: 4.0000 mg | INTRAMUSCULAR | Status: DC | PRN

## 2023-01-27 MED ORDER — OXYCODONE HCL 5 MG/5ML PO SOLN
ORAL | Status: AC
  Administered 2023-01-27: 5 mg via ORAL
  Filled 2023-01-27: qty 5

## 2023-01-27 MED ORDER — OXYCODONE HCL 5 MG PO TABS: 5.0000 mg | ORAL_TABLET | Freq: Once | ORAL | Status: AC | PRN

## 2023-01-27 MED ORDER — DEXAMETHASONE SODIUM PHOSPHATE 10 MG/ML IJ SOLN
INTRAMUSCULAR | Status: DC | PRN
  Administered 2023-01-27: 10 mg via INTRAVENOUS

## 2023-01-27 MED ORDER — ACETAMINOPHEN 10 MG/ML IV SOLN: 1000.0000 mg | Freq: Once | INTRAVENOUS | Status: DC | PRN

## 2023-01-27 MED ORDER — MIDAZOLAM HCL 2 MG/2ML IJ SOLN
INTRAMUSCULAR | Status: AC
  Filled 2023-01-27: qty 2

## 2023-01-27 MED ORDER — SUGAMMADEX SODIUM 200 MG/2ML IV SOLN
INTRAVENOUS | Status: DC | PRN
  Administered 2023-01-27: 300 mg via INTRAVENOUS

## 2023-01-27 MED ORDER — DEXTROSE-SODIUM CHLORIDE 5-0.45 % IV SOLN: INTRAVENOUS | Status: DC

## 2023-01-27 MED ORDER — ACETAMINOPHEN 160 MG/5ML PO SOLN: 325.0000 mg | ORAL | Status: DC | PRN

## 2023-01-27 MED ORDER — LIDOCAINE HCL (PF) 2 % IJ SOLN
INTRAMUSCULAR | Status: AC
  Filled 2023-01-27: qty 5

## 2023-01-27 MED ORDER — ENSURE MAX PROTEIN PO LIQD
2.0000 [oz_av] | ORAL | Status: DC
  Administered 2023-01-28 (×4): 2 [oz_av] via ORAL
  Filled 2023-01-27 (×14): qty 330

## 2023-01-27 MED ORDER — BUPIVACAINE HCL (PF) 0.25 % IJ SOLN
INTRAMUSCULAR | Status: AC
  Filled 2023-01-27: qty 30

## 2023-01-27 MED ORDER — BUPIVACAINE LIPOSOME 1.3 % IJ SUSP: 20.0000 mL | Freq: Once | INTRAMUSCULAR | Status: DC

## 2023-01-27 MED ORDER — PROPOFOL 10 MG/ML IV BOLUS
INTRAVENOUS | Status: DC | PRN
  Administered 2023-01-27: 160 mg via INTRAVENOUS

## 2023-01-27 MED ORDER — 0.9 % SODIUM CHLORIDE (POUR BTL) OPTIME
TOPICAL | Status: DC | PRN
  Administered 2023-01-27: 1000 mL

## 2023-01-27 MED ORDER — ROCURONIUM BROMIDE 10 MG/ML (PF) SYRINGE
PREFILLED_SYRINGE | INTRAVENOUS | Status: DC | PRN
  Administered 2023-01-27: 10 mg via INTRAVENOUS
  Administered 2023-01-27: 70 mg via INTRAVENOUS

## 2023-01-27 SURGICAL SUPPLY — 67 items
ANTIFOG SOL W/FOAM PAD STRL (MISCELLANEOUS) ×1
APL PRP STRL LF DISP 70% ISPRP (MISCELLANEOUS) ×1
APL SKNCLS STERI-STRIP NONHPOA (GAUZE/BANDAGES/DRESSINGS) ×1
APPLIER CLIP ROT 13.4 12 LRG (CLIP) ×1
APR CLP LRG 13.4X12 ROT 20 MLT (CLIP) ×1
BAG COUNTER SPONGE SURGICOUNT (BAG) IMPLANT
BAG LAPAROSCOPIC 12 15 PORT 16 (BASKET) ×1 IMPLANT
BAG RETRIEVAL 12/15 (BASKET) ×1
BAG SPNG CNTER NS LX DISP (BAG)
BENZOIN TINCTURE PRP APPL 2/3 (GAUZE/BANDAGES/DRESSINGS) ×1 IMPLANT
BLADE SURG SZ11 CARB STEEL (BLADE) ×1 IMPLANT
BNDG ADH 1X3 SHEER STRL LF (GAUZE/BANDAGES/DRESSINGS) ×6 IMPLANT
BNDG ADH THN 3X1 STRL LF (GAUZE/BANDAGES/DRESSINGS) ×6
CABLE HIGH FREQUENCY MONO STRZ (ELECTRODE) IMPLANT
CHLORAPREP W/TINT 26 (MISCELLANEOUS) ×1 IMPLANT
CLIP APPLIE ROT 13.4 12 LRG (CLIP) IMPLANT
COVER SURGICAL LIGHT HANDLE (MISCELLANEOUS) ×1 IMPLANT
DRAPE UTILITY XL STRL (DRAPES) ×2 IMPLANT
ELECT REM PT RETURN 15FT ADLT (MISCELLANEOUS) ×1 IMPLANT
GAUZE 4X4 16PLY ~~LOC~~+RFID DBL (SPONGE) ×1 IMPLANT
GLOVE BIOGEL PI IND STRL 7.0 (GLOVE) ×1 IMPLANT
GLOVE SURG SS PI 7.0 STRL IVOR (GLOVE) ×1 IMPLANT
GOWN STRL REUS W/ TWL LRG LVL3 (GOWN DISPOSABLE) ×1 IMPLANT
GOWN STRL REUS W/ TWL XL LVL3 (GOWN DISPOSABLE) IMPLANT
GOWN STRL REUS W/TWL LRG LVL3 (GOWN DISPOSABLE) ×1
GOWN STRL REUS W/TWL XL LVL3 (GOWN DISPOSABLE)
GRASPER SUT TROCAR 14GX15 (MISCELLANEOUS) ×1 IMPLANT
IRRIG SUCT STRYKERFLOW 2 WTIP (MISCELLANEOUS) ×1
IRRIGATION SUCT STRKRFLW 2 WTP (MISCELLANEOUS) ×1 IMPLANT
KIT BASIN OR (CUSTOM PROCEDURE TRAY) ×1 IMPLANT
KIT TURNOVER KIT A (KITS) IMPLANT
MARKER SKIN DUAL TIP RULER LAB (MISCELLANEOUS) ×1 IMPLANT
MAT PREVALON FULL STRYKER (MISCELLANEOUS) IMPLANT
NDL SPNL 22GX3.5 QUINCKE BK (NEEDLE) ×1 IMPLANT
NEEDLE SPNL 22GX3.5 QUINCKE BK (NEEDLE) ×1 IMPLANT
PACK UNIVERSAL I (CUSTOM PROCEDURE TRAY) ×1 IMPLANT
RELOAD STAPLE 60 3.6 BLU REG (STAPLE) IMPLANT
RELOAD STAPLE 60 3.8 GOLD REG (STAPLE) IMPLANT
RELOAD STAPLE 60 4.1 GRN THCK (STAPLE) IMPLANT
RELOAD STAPLER BLUE 60MM (STAPLE) ×4 IMPLANT
RELOAD STAPLER GOLD 60MM (STAPLE) ×2 IMPLANT
RELOAD STAPLER GREEN 60MM (STAPLE) IMPLANT
SCISSORS LAP 5X45 EPIX DISP (ENDOMECHANICALS) IMPLANT
SET TUBE SMOKE EVAC HIGH FLOW (TUBING) ×1 IMPLANT
SHEARS HARMONIC ACE PLUS 45CM (MISCELLANEOUS) ×1 IMPLANT
SLEEVE GASTRECTOMY 40FR VISIGI (MISCELLANEOUS) ×1 IMPLANT
SLEEVE Z-THREAD 5X100MM (TROCAR) ×2 IMPLANT
SOLUTION ANTFG W/FOAM PAD STRL (MISCELLANEOUS) ×1 IMPLANT
SPIKE FLUID TRANSFER (MISCELLANEOUS) ×1 IMPLANT
STAPLE LINE REINFORCEMENT LAP (STAPLE) IMPLANT
STAPLER ECHELON LONG 3000 60 (ENDOMECHANICALS) ×1 IMPLANT
STAPLER ECHELON LONG 60 440 (INSTRUMENTS) ×1 IMPLANT
STAPLER RELOAD BLUE 60MM (STAPLE) ×4
STAPLER RELOAD GOLD 60MM (STAPLE) ×2
STAPLER RELOAD GREEN 60MM (STAPLE)
STRIP CLOSURE SKIN 1/2X4 (GAUZE/BANDAGES/DRESSINGS) ×1 IMPLANT
SUT ETHIBOND 0 36 GRN (SUTURE) IMPLANT
SUT MNCRL AB 4-0 PS2 18 (SUTURE) ×1 IMPLANT
SUT VICRYL 0 TIES 12 18 (SUTURE) ×1 IMPLANT
SYR 20ML LL LF (SYRINGE) ×1 IMPLANT
SYR 50ML LL SCALE MARK (SYRINGE) ×1 IMPLANT
SYS KII OPTICAL ACCESS 15MM (TROCAR) ×1
SYSTEM KII OPTICAL ACCESS 15MM (TROCAR) ×1 IMPLANT
TOWEL OR 17X26 10 PK STRL BLUE (TOWEL DISPOSABLE) ×1 IMPLANT
TOWEL OR NON WOVEN STRL DISP B (DISPOSABLE) ×1 IMPLANT
TROCAR Z-THREAD OPTICAL 5X100M (TROCAR) ×1 IMPLANT
TUBING CONNECTING 10 (TUBING) ×2 IMPLANT

## 2023-01-27 NOTE — Progress Notes (Signed)
Discussed QI "Goals for Discharge" document with patient including ambulation in halls, Incentive Spirometry use every hour, and oral care.  Also discussed pain and nausea control.  Enabled or verified head of bed 30 degree alarm activated.  BSTOP education provided including BSTOP information guide, "Guide for Pain Management after your Bariatric Procedure".  Diet progression education provided including "Bariatric Surgery Post-Op Food Plan Phase 1: Liquids".  Questions answered.  Will continue to partner with bedside RN and follow up with patient per protocol.    Thank you,  Calaya Gildner Taneah Masri, RN, MSN Bariatric Nurse Coordinator [phone] (office)  

## 2023-01-27 NOTE — Progress Notes (Signed)
PHARMACY CONSULT FOR:  Risk Assessment for Post-Discharge VTE Following Bariatric Surgery  Post-Discharge VTE Risk Assessment: This patient's probability of 30-day post-discharge VTE is increased due to the factors marked: x Sleeve gastrectomy   Liver disorder (transplant, cirrhosis, or nonalcoholic steatohepatitis)   Hx of VTE   Hemorrhage requiring transfusion   GI perforation, leak, or obstruction   ====================================================    Female    Age >/=60 years   x BMI >/=50 kg/m2    CHF    Dyspnea at Rest    Paraplegia  x  Non-gastric-band surgery    Operation Time >/=3 hr    Return to OR     Length of Stay >/= 3 d   Hypercoagulable condition   Significant venous stasis      Predicted probability of 30-day post-discharge VTE:  Other patient-specific factors to consider: - 0.27%   Recommendation for Discharge: No pharmacologic prophylaxis post-discharge        Courtney Mooney is a 37 y.o. female who underwent  laparoscopic Sleeve Gastrectomy (procedure) on 01/27/23   Case start: 1009 Case end: 1057   No Known Allergies  Patient Measurements: Weight: (!) 142.2 kg (313 lb 6.4 oz) Body mass index is 52.15 kg/m.  Recent Labs    01/27/23 1156  WBC 26.5*  HGB 13.1  HCT 42.3  PLT 418*  CREATININE 0.95   Estimated Creatinine Clearance: 115.5 mL/min (by C-G formula based on SCr of 0.95 mg/dL).    Past Medical History:  Diagnosis Date   Anemia    Anxiety    HSV-2 (herpes simplex virus 2) infection    Hypertension    PCOS (polycystic ovarian syndrome)    Pre-diabetes    Prediabetes    Vaginal Pap smear, abnormal    Vitamin D deficiency      Medications Prior to Admission  Medication Sig Dispense Refill Last Dose   amLODipine (NORVASC) 5 MG tablet Take 1 tablet (5 mg total) by mouth daily. 90 tablet 1 01/27/2023 at 0630   cetirizine (ZYRTEC) 10 MG tablet Take 10 mg by mouth daily.   01/27/2023 at 0630   Multiple Vitamin  (MULTIVITAMIN) capsule Take 1 capsule by mouth daily.   Past Month   spironolactone (ALDACTONE) 50 MG tablet Take 1 tablet by mouth once daily 90 tablet 1 01/26/2023   diclofenac Sodium (VOLTAREN) 1 % GEL Rub a small grape sized dollop on sore area beneath the [facility] joint of left shoulder 3 times daily. (Patient not taking: Reported on 08/15/2022) 150 g 1    FERROUS SULFATE PO Take 1 tablet by mouth daily.   More than a month   metFORMIN (GLUCOPHAGE-XR) 500 MG 24 hr tablet Take 1 tablet (500 mg total) by mouth at bedtime. (Patient not taking: Reported on 01/08/2023) 90 tablet 1 Not Taking   tirzepatide [facility]) 5 MG/0.5ML Pen Inject 5 mg into the skin once a week. (Patient not taking: Reported on 01/08/2023) 6 mL 1 Not Taking      Adalberto Cole, PharmD, BCPS 01/27/2023 5:57 PM

## 2023-01-27 NOTE — Anesthesia Procedure Notes (Signed)
Procedure Name: Intubation Date/Time: 01/27/2023 9:47 AM  Performed by: Florene Route, CRNAPre-anesthesia Checklist: Patient identified, Emergency Drugs available, Suction available and Patient being monitored Patient Re-evaluated:Patient Re-evaluated prior to induction Oxygen Delivery Method: Circle system utilized Preoxygenation: Pre-oxygenation with 100% oxygen Induction Type: IV induction Ventilation: Mask ventilation without difficulty and Oral airway inserted - appropriate to patient size Laryngoscope Size: Hyacinth Meeker and 2 Grade View: Grade I Tube type: Oral Tube size: 7.5 mm Number of attempts: 1 Airway Equipment and Method: Stylet and Oral airway Placement Confirmation: ETT inserted through vocal cords under direct vision, positive ETCO2 and breath sounds checked- equal and bilateral Secured at: 22 cm Tube secured with: Tape Dental Injury: Teeth and Oropharynx as per pre-operative assessment

## 2023-01-27 NOTE — Op Note (Signed)
Preoperative diagnosis: laparoscopic sleeve gastrectomy  Postoperative diagnosis: Same   Procedure: Upper endoscopy   Surgeon: Kristyanna Barcelo A Andrei Mccook, M.D.  Anesthesia: Gen.   Description of procedure: The endoscope was placed in the mouth and oropharynx and under endoscopic vision it was advanced to the esophagogastric junction which was identified at 36cm from the teeth.  The pouch was tensely insufflated while the upper abdomen was flooded with irrigation to perform a leak test, which was negative. No bubbles were seen.  The staple line was hemostatic and the lumen was evenly tubular without undue narrowing, angulation or twisting specifically at the incisura angularis. The lumen was decompressed and the scope was withdrawn without difficulty.    Reaghan Kawa A Kimarie Coor, M.D. General, Bariatric, & Minimally Invasive Surgery [facility]    

## 2023-01-27 NOTE — Anesthesia Postprocedure Evaluation (Signed)
Anesthesia Post Note  Patient: Higher education careers adviser  Procedure(s) Performed: LAPAROSCOPIC SLEEVE GASTRECTOMY UPPER GI ENDOSCOPY     Patient location during evaluation: PACU Anesthesia Type: General Level of consciousness: awake and alert Pain management: pain level controlled Vital Signs Assessment: post-procedure vital signs reviewed and stable Respiratory status: spontaneous breathing, nonlabored ventilation, respiratory function stable and patient connected to nasal cannula oxygen Cardiovascular status: blood pressure returned to baseline and stable Postop Assessment: no apparent nausea or vomiting Anesthetic complications: no  No notable events documented.  Last Vitals:  Vitals:   01/27/23 1245 01/27/23 1300  BP: (!) 164/99 (!) 165/98  Pulse: (!) 105 (!) 106  Resp: 17 18  Temp:    SpO2: 100% 99%    Last Pain:  Vitals:   01/27/23 1300  TempSrc:   PainSc: 0-No pain                 Shelton Silvas

## 2023-01-27 NOTE — Op Note (Signed)
Preop Diagnosis: Obesity Class III  Postop Diagnosis: same  Procedure performed: laparoscopic Sleeve Gastrectomy  Assitant: Phylliss Blakes  Indications:  The patient is a 37 y.o. year-old morbidly obese female who has been followed in the Bariatric Clinic as an outpatient. This patient was diagnosed with morbid obesity with a BMI of Body mass index is 52.15 kg/m. and significant co-morbidities including hypertension.  The patient was counseled extensively in the Bariatric Outpatient Clinic and after a thorough explanation of the risks and benefits of surgery (including death from complications, bowel leak, infection such as peritonitis and/or sepsis, internal hernia, bleeding, need for blood transfusion, bowel obstruction, organ failure, pulmonary embolus, deep venous thrombosis, wound infection, incisional hernia, skin breakdown, and others entailed on the consent form) and after a compliant diet and exercise program, the patient was scheduled for an elective laparoscopic sleeve gastrectomy.  Description of Operation:  Following informed consent, the patient was taken to the operating room and placed on the operating table in the supine position.  She had previously received prophylactic antibiotics and subcutaneous heparin for DVT prophylaxis in the pre-op holding area.  After induction of general endotracheal anesthesia by the anesthesiologist, the patient underwent placement of sequential compression devices and an oro-gastric tube.  A timeout was confirmed by the surgery and anesthesia teams.  The patient was adequately padded at all pressure points and placed on a footboard to prevent slippage from the OR table during extremes of position during surgery.  She underwent a routine sterile prep and drape of her entire abdomen.    Next, A transverse incision was made under the left subcostal area and a 5mm optical viewing trocar was introduced into the peritoneal cavity. Pneumoperitoneum was applied  with a high flow and low pressure. A laparoscope was inserted to confirm placement. A extraperitoneal block was then placed at the lateral abdominal wall using exparel diluted with marcaine. 5 additional incisions were placed: 1 5mm trocar to the left of the midline. 1 additional 5mm trocar in the left lateral area, 1 12mm trocar in the right mid abdomen, 1 5mm trocar in the right subcostal area, and a Nathanson retractor was placed through a subxiphoid incision.  Next, a hole was created through the lesser omentum along the greater curve of the stomach to enter the lesser sac. The vessels along the greater omentum were  Then ligated and divided using the Harmonic scalpel moving towards the spleen and then short gastric vessels were ligated and divided in the same fashion to fully mobilize the fundus. The left crus was identified to ensure completion of the dissection. Next the antrum was measured and dissection continued inferiorly along the greater curve towards the pylorus and stopped 6cm from the pylorus.   A 40Fr ViSiGi dilator was placed into the esophgaus and along the lesser curve of the stomach and placed on suction. 1 60mm Gold load echelon stapler(s) followed by 4 60mm blue load echelon stapler(s) were used to make the resection along the antrum being sure to stay well away from the angularis by angling the jaws of the stapler towards the greater curve and later completing the resection staying along the ViSiGi and ensuring the fundus was not retained by appropriately retracting it lateral. Air was inserted through the ViSiGi to perform a leak test showing no bubbles and a neutral lie of the stomach.  The assistant then went and performed an upper endoscopy and leak test. No bubbles were seen and the sleeve and antrum distended appropriately. The  specimen was then placed in an endocatch bag and removed by the 15mm port. The pressure was changed to 8 mm Hg. There were 3 areas of small bleed that  clips were used for hemostasis. Hemostasis was ensured. The fascia of the 15mm port was closed with a 0 vicryl by suture passer. Pneumoperitoneum was evacuated, all ports were removed and all incisions closed with 4-0 monocryl suture in subcuticular fashion. Steristrips and bandaids were put in place for dressing. The patient awoke from anesthesia and was brought to pacu in stable condition. All counts were correct.  Estimated blood loss: <72ml  Specimens:  Sleeve gastrectomy  Local Anesthesia: 50 ml Exparel:0.5% Marcaine mix  Post-Op Plan:       Pain Management: PO, prn      Antibiotics: Prophylactic      Anticoagulation: Prophylactic, Starting now      Post Op Studies/Consults: Not applicable      Intended Discharge: within 48h      Intended Outpatient Follow-Up: Two Week      Intended Outpatient Studies: Not Applicable      Other: Not Applicable  De Blanch Aksh Swart

## 2023-01-27 NOTE — Transfer of Care (Signed)
Immediate Anesthesia Transfer of Care Note  Patient: Courtney Mooney  Procedure(s) Performed: LAPAROSCOPIC SLEEVE GASTRECTOMY UPPER GI ENDOSCOPY  Patient Location: PACU  Anesthesia Type:General  Level of Consciousness: drowsy  Airway & Oxygen Therapy: Patient Spontanous Breathing and Patient connected to face mask oxygen  Post-op Assessment: Report given to RN and Post -op Vital signs reviewed and stable  Post vital signs: Reviewed and stable  Last Vitals:  Vitals Value Taken Time  BP 158/114 01/27/23 1106  Temp    Pulse 105 01/27/23 1107  Resp 21 01/27/23 1107  SpO2 100 % 01/27/23 1107  Vitals shown include unvalidated device data.  Last Pain:  Vitals:   01/27/23 0811  TempSrc: Oral  PainSc:          Complications: No notable events documented.

## 2023-01-27 NOTE — H&P (Signed)
Chief Complaint: RETURN WEIGHT LOSS (Preop for surgery date 01/27/2023; LSG and upper endo/)   History of Present Illness: Courtney Mooney is a 37 y.o. female who is seen today for bariatric preop discussion.  She has no new medical problems. She has no new medications. She has no questions.  Review of Systems: A complete review of systems was obtained from the patient. I have reviewed this information and discussed as appropriate with the patient. See HPI as well for other ROS.  Review of Systems  Constitutional: Negative.  HENT: Negative.  Eyes: Negative.  Respiratory: Negative.  Cardiovascular: Negative.  Gastrointestinal: Negative.  Genitourinary: Negative.  Musculoskeletal: Negative.  Skin: Negative.  Neurological: Negative.  Endo/Heme/Allergies: Negative.  Psychiatric/Behavioral: Negative.    Medical History: Past Medical History:  Diagnosis Date  Anxiety  Hypertension   There is no problem list on file for this patient.  Past Surgical History:  Procedure Laterality Date  CESAREAN SECTION 10/13/2008  CHOLECYSTECTOMY 03/13/2015  Dr. Brennan Bailey  CESAREAN SECTION 07/15/2017  LAPAROSCOPIC BILATERAL SALPINGECTOMY 10/24/2021  Dr. Sandford Craze    No Known Allergies  Current Outpatient Medications on File Prior to Visit  Medication Sig Dispense Refill  amLODIPine (NORVASC) 5 MG tablet Take 5 mg by mouth once daily  ASHWAGANDHA EXTRACT ORAL Take 1 capsule by mouth once daily  cetirizine (ZYRTEC) 10 MG tablet Take 10 mg by mouth once daily  metFORMIN (GLUCOPHAGE-XR) 500 MG XR tablet Take 500 mg by mouth at bedtime  multivitamin with minerals Cap Take 1 capsule by mouth once daily  spironolactone (ALDACTONE) 50 MG tablet Take 50 mg by mouth once daily   No current facility-administered medications on file prior to visit.   Family History  Problem Relation Age of Onset  High blood pressure (Hypertension) Mother  Obesity Mother  Obesity Father  Hyperlipidemia  (Elevated cholesterol) Father  High blood pressure (Hypertension) Father  Coronary Artery Disease (Blocked arteries around heart) Father  Diabetes Father  Deep vein thrombosis (DVT or abnormal blood clot formation) Sister  Obesity Sister    Social History   Tobacco Use  Smoking Status Never  Smokeless Tobacco Never    Social History   Socioeconomic History  Marital status: Single  Tobacco Use  Smoking status: Never  Smokeless tobacco: Never  Substance and Sexual Activity  Alcohol use: Yes  Comment: Occasionally  Drug use: Yes  Comment: Marijuana occasionally   Objective:   Vitals:  01/21/23 1012 01/21/23 1013  BP: 134/83  Pulse: 88  Resp: 16  Temp: 36.7 C (98 F)  SpO2: 97%  Weight: (!) 145 kg (319 lb 9.6 oz)  Height: 165.1 cm (5\' 5" )  PainSc: 0-No pain   Body mass index is 53.18 kg/m.  Physical Exam Constitutional:  Appearance: Normal appearance.  HENT:  Head: Normocephalic and atraumatic.  Pulmonary:  Effort: Pulmonary effort is normal.  Musculoskeletal:  General: Normal range of motion.  Cervical back: Normal range of motion.  Neurological:  General: No focal deficit present.  Mental Status: She is alert and oriented to person, place, and time. Mental status is at baseline.  Psychiatric:  Mood and Affect: Mood normal.  Behavior: Behavior normal.  Thought Content: Thought content normal.     Labs, Imaging and Diagnostic Testing:  I reviewed UGI and labs showing moderate reflux without hiatal hernia. A1c 6.1 otherwise labs were normal  Assessment and Plan:   Diagnoses and all orders for this visit:  Morbid (severe) obesity due to excess calories (CMS/HHS-HCC)  Essential hypertension  Iron deficiency    The patient meets weight loss surgery criteria. Due to the above reasons, I think minimally invasive vertical sleeve gastrectomy is the best option for the patient.   We discussed sleeve gastrectomy. We discussed the preoperative,  operative and postoperative process. I explained the surgery in detail including the performance of an EGD near the end of the surgery to test for leak. We discussed the typical hospital course including a 1-2 day stay baring any complications. The patient was given educational material. I quoted the patient that most patients can lose up to 50-70% of their excess weight. We did discuss the possibility of weight regain several years after the procedure.   The risks of infection, bleeding, pain, scarring, weight regain, too little or too much weight loss, vitamin deficiencies and need for lifelong vitamin supplementation, hair loss, need for protein supplementation, leaks, stricture, reflux, food intolerance, gallstone formation, hernia, need for reoperation, need for open surgery, injury to spleen or surrounding structures, DVT's, PE, and death again discussed with the patient and the patient expressed understanding and desires to proceed with minimally invasive sleeve gastrectomy, possible open, intraoperative endoscopy.  We discussed that before and after surgery that there would be an alteration in their diet. I explained that we may put them on a diet 2 weeks before surgery. I also explained that they would be on a liquid diet for 2 weeks after surgery. We discussed that they would have to avoid certain foods after surgery. We discussed the importance of physical activity as well as compliance with our dietary and supplement recommendations and routine follow-up.

## 2023-01-27 NOTE — Anesthesia Preprocedure Evaluation (Addendum)
Anesthesia Evaluation  Patient identified by MRN, date of birth, ID band Patient awake    Reviewed: Allergy & Precautions, NPO status , Patient's Chart, lab work & pertinent test results  Airway Mallampati: I  TM Distance: >3 FB Neck ROM: Full    Dental  (+) Teeth Intact, Dental Advisory Given   Pulmonary    breath sounds clear to auscultation       Cardiovascular hypertension, Pt. on medications  Rhythm:Regular Rate:Normal     Neuro/Psych    GI/Hepatic   Endo/Other  diabetes, Type 2, Oral Hypoglycemic Agents    Renal/GU      Musculoskeletal   Abdominal   Peds  Hematology   Anesthesia Other Findings   Reproductive/Obstetrics                             Anesthesia Physical Anesthesia Plan  ASA: 3  Anesthesia Plan: General   Post-op Pain Management: Tylenol PO (pre-op)*   Induction: Intravenous and Rapid sequence  PONV Risk Score and Plan: 4 or greater and Ondansetron, Dexamethasone, Midazolam and Scopolamine patch - Pre-op  Airway Management Planned: Oral ETT  Additional Equipment: None  Intra-op Plan:   Post-operative Plan: Extubation in OR  Informed Consent: I have reviewed the patients History and Physical, chart, labs and discussed the procedure including the risks, benefits and alternatives for the proposed anesthesia with the patient or authorized representative who has indicated his/her understanding and acceptance.     Dental advisory given  Plan Discussed with: CRNA  Anesthesia Plan Comments:        Anesthesia Quick Evaluation

## 2023-01-28 ENCOUNTER — Encounter (HOSPITAL_COMMUNITY): Payer: Self-pay | Admitting: General Surgery

## 2023-01-28 LAB — CBC WITH DIFFERENTIAL/PLATELET
Abs Immature Granulocytes: 0.07 10*3/uL (ref 0.00–0.07)
Basophils Absolute: 0 10*3/uL (ref 0.0–0.1)
Basophils Relative: 0 %
Eosinophils Absolute: 0 10*3/uL (ref 0.0–0.5)
Eosinophils Relative: 0 %
HCT: 38.8 % (ref 36.0–46.0)
Hemoglobin: 12 g/dL (ref 12.0–15.0)
Immature Granulocytes: 0 %
Lymphocytes Relative: 9 %
Lymphs Abs: 1.5 10*3/uL (ref 0.7–4.0)
MCH: 26.3 pg (ref 26.0–34.0)
MCHC: 30.9 g/dL (ref 30.0–36.0)
MCV: 85.1 fL (ref 80.0–100.0)
Monocytes Absolute: 0.9 10*3/uL (ref 0.1–1.0)
Monocytes Relative: 6 %
Neutro Abs: 13.5 10*3/uL — ABNORMAL HIGH (ref 1.7–7.7)
Neutrophils Relative %: 85 %
Platelets: 386 10*3/uL (ref 150–400)
RBC: 4.56 MIL/uL (ref 3.87–5.11)
RDW: 13.6 % (ref 11.5–15.5)
WBC: 16 10*3/uL — ABNORMAL HIGH (ref 4.0–10.5)
nRBC: 0 % (ref 0.0–0.2)

## 2023-01-28 LAB — BASIC METABOLIC PANEL
Anion gap: 8 (ref 5–15)
BUN: 10 mg/dL (ref 6–20)
CO2: 25 mmol/L (ref 22–32)
Calcium: 8.7 mg/dL — ABNORMAL LOW (ref 8.9–10.3)
Chloride: 103 mmol/L (ref 98–111)
Creatinine, Ser: 0.84 mg/dL (ref 0.44–1.00)
GFR, Estimated: >60 mL/min (ref >60)
Glucose, Bld: 137 mg/dL — ABNORMAL HIGH (ref 70–99)
Potassium: 4.2 mmol/L (ref 3.5–5.1)
Sodium: 136 mmol/L (ref 135–145)

## 2023-01-28 LAB — GLUCOSE, CAPILLARY
Glucose-Capillary: 117 mg/dL — ABNORMAL HIGH (ref 70–99)
Glucose-Capillary: 78 mg/dL (ref 70–99)

## 2023-01-28 MED ORDER — ONDANSETRON 4 MG PO TBDP
4.0000 mg | ORAL_TABLET | Freq: Four times a day (QID) | ORAL | 0 refills | Status: DC | PRN
Start: 2023-01-28 — End: 2024-02-04

## 2023-01-28 MED ORDER — ACETAMINOPHEN 500 MG PO TABS
1000.0000 mg | ORAL_TABLET | Freq: Three times a day (TID) | ORAL | 0 refills | Status: AC
Start: 2023-01-28 — End: 2023-02-02

## 2023-01-28 MED ORDER — OXYCODONE HCL 5 MG PO TABS
5.0000 mg | ORAL_TABLET | Freq: Four times a day (QID) | ORAL | 0 refills | Status: DC | PRN
Start: 2023-01-28 — End: 2024-02-04

## 2023-01-28 MED ORDER — PANTOPRAZOLE SODIUM 40 MG PO TBEC
40.0000 mg | DELAYED_RELEASE_TABLET | Freq: Every day | ORAL | 0 refills | Status: DC
Start: 2023-01-28 — End: 2024-02-04

## 2023-01-28 MED ORDER — GABAPENTIN 100 MG PO CAPS
200.0000 mg | ORAL_CAPSULE | Freq: Two times a day (BID) | ORAL | 0 refills | Status: DC
Start: 2023-01-28 — End: 2024-02-04

## 2023-01-28 NOTE — Plan of Care (Signed)
Patient is stable for discharge. Discharge instructions have been given, all questions answered. Patient is discharged to home with significant other.

## 2023-01-28 NOTE — Discharge Summary (Signed)
Physician Discharge Summary  Courtney Mooney ZOX:[id number] DOB: Jun 09, 1986 DOA: 01/27/2023  PCP: Mliss Sax, MD  Admit date: 01/27/2023 Discharge date:  01/28/2023   Recommendations for Outpatient Follow-up:   (include homehealth, outpatient follow-up instructions, specific recommendations for PCP to follow-up on, etc.)   Discharge Diagnoses:  Principal Problem:   Morbid (severe) obesity due to excess calories [facility])   Surgical Procedure: laparoscopic sleeve gastrectomy, upper endoscopy  Discharge Condition: Good Disposition: Home  Diet recommendation: Postoperative sleeve gastrectomy diet (liquids only)  Filed Weights   01/27/23 0811  Weight: (!) 142.2 kg     Hospital Course:  The patient was admitted after undergoing laparoscopic sleeve gastrectomy. POD 0 she ambulated well. POD 1 she was started on the water diet protocol and tolerated 300 ml in the first shift. Once meeting the water amount she was advanced to bariatric protein shakes which they tolerated and were discharged home POD 1.  Treatments: surgery: laparoscopic sleeve gastrectomy  Discharge Instructions  Discharge Instructions     Ambulate hourly while awake   Complete by: As directed    Call MD for:  difficulty breathing, headache or visual disturbances   Complete by: As directed    Call MD for:  persistant dizziness or light-headedness   Complete by: As directed    Call MD for:  persistant nausea and vomiting   Complete by: As directed    Call MD for:  redness, tenderness, or signs of infection (pain, swelling, redness, odor or green/yellow discharge around incision site)   Complete by: As directed    Call MD for:  severe uncontrolled pain   Complete by: As directed    Call MD for:  temperature >101 F   Complete by: As directed    Diet bariatric full liquid   Complete by: As directed    Discharge wound care:   Complete by: As directed    Remove Bandaids tomorrow, ok to shower tomorrow.  Steristrips may fall off in 1-3 weeks.   Incentive spirometry   Complete by: As directed    Perform hourly while awake      Allergies as of 01/28/2023   No Known Allergies      Medication List     STOP taking these medications    diclofenac Sodium 1 % Gel Commonly known as: Voltaren   metFORMIN 500 MG 24 hr tablet Commonly known as: GLUCOPHAGE-XR   spironolactone 50 MG tablet Commonly known as: ALDACTONE   tirzepatide 5 MG/0.5ML Pen Commonly known as: MOUNJARO       TAKE these medications    acetaminophen 500 MG tablet Commonly known as: TYLENOL Take 2 tablets (1,000 mg total) by mouth every 8 (eight) hours for 5 days.   amLODipine 5 MG tablet Commonly known as: NORVASC Take 1 tablet (5 mg total) by mouth daily.   cetirizine 10 MG tablet Commonly known as: ZYRTEC Take 10 mg by mouth daily.   FERROUS SULFATE PO Take 1 tablet by mouth daily.   gabapentin 100 MG capsule Commonly known as: NEURONTIN Take 2 capsules (200 mg total) by mouth every 12 (twelve) hours.   multivitamin capsule Take 1 capsule by mouth daily.   ondansetron 4 MG disintegrating tablet Commonly known as: ZOFRAN-ODT Take 1 tablet (4 mg total) by mouth every 6 (six) hours as needed for nausea or vomiting.   oxyCODONE 5 MG immediate release tablet Commonly known as: Oxy IR/ROXICODONE Take 1 tablet (5 mg total) by mouth every 6 (six) hours as  needed for severe pain.   pantoprazole 40 MG tablet Commonly known as: PROTONIX Take 1 tablet (40 mg total) by mouth daily.               Discharge Care Instructions  (From admission, onward)           Start     Ordered   01/28/23 0000  Discharge wound care:       Comments: Remove Bandaids tomorrow, ok to shower tomorrow. Steristrips may fall off in 1-3 weeks.   01/28/23 0752              The results of significant diagnostics from this hospitalization (including imaging, microbiology, ancillary and laboratory) are listed  below for reference.    Significant Diagnostic Studies: No results found.  Labs: Basic Metabolic Panel: Recent Labs  Lab 01/27/23 1156 01/28/23 0503  NA  --  136  K  --  4.2  CL  --  103  CO2  --  25  GLUCOSE  --  137*  BUN  --  10  CREATININE 0.95 0.84  CALCIUM  --  8.7*   Liver Function Tests: No results for input(s): "AST", "ALT", "ALKPHOS", "BILITOT", "PROT", "ALBUMIN" in the last 168 hours.  CBC: Recent Labs  Lab 01/27/23 1156 01/27/23 1653 01/28/23 0503  WBC 26.5*  --  16.0*  NEUTROABS  --   --  13.5*  HGB 13.1 13.3 12.0  HCT 42.3 42.4 38.8  MCV 87.0  --  85.1  PLT 418*  --  386    CBG: Recent Labs  Lab 01/27/23 2003 01/28/23 0725  GLUCAP 146* 117*    Principal Problem:   Morbid (severe) obesity due to excess calories (HCC)   VTE plan: no chemical prophylaxis recommended ([email or web address])  Time coordinating discharge: 15 min

## 2023-01-28 NOTE — Discharge Instructions (Signed)

## 2023-01-28 NOTE — Progress Notes (Signed)
Patient alert and oriented, pain is controlled. Patient is tolerating fluids, advanced to protein shake today, patient is tolerating well. Reviewed Gastric sleeve/bypass discharge instructions with patient and patient is able to articulate understanding. Provided information on BELT program, Support Group, BSTOP-D, and [facility] outpatient pharmacy. Communicated general update of patient status to surgeon. All questions answered. 24hr fluid recall is 420 mL per hydration protocol, bariatric nurse coordinator to make follow-up phone call within one week.

## 2023-01-30 ENCOUNTER — Encounter: Payer: Self-pay | Admitting: *Deleted

## 2023-01-30 ENCOUNTER — Telehealth: Payer: Self-pay | Admitting: *Deleted

## 2023-01-30 LAB — SURGICAL PATHOLOGY

## 2023-01-30 NOTE — Transitions of Care (Post Inpatient/ED Visit) (Signed)
01/30/2023  Name: Courtney Mooney MRN: [id number] DOB: [birth year]  Today's TOC FU Call Status: Today's TOC FU Call Status:: Successful TOC FU Call Competed TOC FU Call Complete Date: 01/30/23  Transition Care Management Follow-up Telephone Call Date of Discharge: 01/28/23 Discharge Facility: [facility]) Type of Discharge: Inpatient Admission Primary Inpatient Discharge Diagnosis:: morbid obesity; surgical laparascopic sleeve gastrectomy How have you been since you were released from the hospital?: Better ("I am doing well; just a little from the surgery but I am recuperating fine so far.  I have my follow up appointments scheduled like the surgeon told me to") Any questions or concerns?: No  Items Reviewed: Did you receive and understand the discharge instructions provided?: Yes (thoroughly reviewed with patient who verbalizes good understanding of same) Medications obtained,verified, and reconciled?: Yes (Medications Reviewed) (Full medication reconciliation/ review completed; no concerns or discrepancies identified; confirmed patient obtained/ is taking all newly Rx'd medications as instructed; self-manages medications and denies questions/ concerns around medications today) Any new allergies since your discharge?: No Dietary orders reviewed?: Yes Type of Diet Ordered:: post- op gastric sleeve: full liquid Do you have support at home?: Yes People in Home: significant other, child(ren), dependent Name of Support/Comfort Primary Source: Reports independent in self-care activities; supportive boyfriend assists as/ if needed/ indicated  Medications Reviewed Today: Medications Reviewed Today     Reviewed by Michaela Corner, RN (Registered Nurse) on 01/30/23 at 1409  Med List Status: <None>   Medication Order Taking? Sig Documenting Provider Last Dose Status Informant  acetaminophen (TYLENOL) 500 MG tablet [id number] Yes Take 2 tablets (1,000 mg total) by mouth every 8 (eight) hours  for 5 days. Kinsinger, De Blanch, MD Taking Active   amLODipine (NORVASC) 5 MG tablet [id number] Yes Take 1 tablet (5 mg total) by mouth daily. Mliss Sax, MD Taking Active Self  cetirizine (ZYRTEC) 10 MG tablet [id number] Yes Take 10 mg by mouth daily. [provider] Taking Active Self  FERROUS SULFATE PO [id number] No Take 1 tablet by mouth daily.  Patient not taking: Reported on 01/30/2023   [provider] Not Taking Active Self  gabapentin (NEURONTIN) 100 MG capsule [id number] Yes Take 2 capsules (200 mg total) by mouth every 12 (twelve) hours. Kinsinger, De Blanch, MD Taking Active   Multiple Vitamin (MULTIVITAMIN) capsule [id number] Yes Take 1 capsule by mouth daily. [provider] Taking Active Self  omeprazole (PRILOSEC OTC) 20 MG tablet [id number] Yes Take 20 mg by mouth daily. Mliss Sax, MD Taking Active Self           Med Note Michaela Corner   Fri Jan 30, 2023  2:08 PM) 01/30/23: Reports during TOC call, this medication was called in to outpt pharmacy today, as her insurance denied pantoprazole-- states she will pick up omeprazole this afternoon  ondansetron (ZOFRAN-ODT) 4 MG disintegrating tablet [id number] Yes Take 1 tablet (4 mg total) by mouth every 6 (six) hours as needed for nausea or vomiting. Kinsinger, De Blanch, MD Taking Active   oxyCODONE (OXY IR/ROXICODONE) 5 MG immediate release tablet [id number] Yes Take 1 tablet (5 mg total) by mouth every 6 (six) hours as needed for severe pain. Kinsinger, De Blanch, MD Taking Active   pantoprazole (PROTONIX) 40 MG tablet [id number] No Take 1 tablet (40 mg total) by mouth daily.  Patient not taking: Reported on 01/30/2023   Kinsinger, De Blanch, MD Not Taking Active            Med Note (  Michaela Corner   Fri Jan 30, 2023  2:09 PM) 01/30/23: reports during [facility] call this medication was not approved by insurance provider-- surgical team has called in omprazole instead           Home Care and  Equipment/Supplies: Were Home Health Services Ordered?: No Any new equipment or medical supplies ordered?: No  Functional Questionnaire: Do you need assistance with bathing/showering or dressing?: No Do you need assistance with meal preparation?: No Do you need assistance with eating?: No Do you have difficulty maintaining continence: No Do you need assistance with getting out of bed/getting out of a chair/moving?: No Do you have difficulty managing or taking your medications?: No  Follow up appointments reviewed: PCP Follow-up appointment confirmed?: Yes Date of PCP follow-up appointment?: 02/13/23 (verified this is recommended time frame for follow up per hospital discharging provider notes) Follow-up Provider: PCP, Dr. Doreene Burke [facility] Follow-up appointment confirmed?: Yes Date of Specialist follow-up appointment?: 02/18/23 (verified this is recommended time frame for follow up per hospital discharging provider notes) Follow-Up Specialty Provider:: surgeon- [facility] Do you need transportation to your follow-up appointment?: No Do you understand care options if your condition(s) worsen?: Yes-patient verbalized understanding  SDOH Interventions Today    Flowsheet Row Most Recent Value  SDOH Interventions   Food Insecurity Interventions Intervention Not Indicated  Transportation Interventions Intervention Not Indicated  [drives self]      TOC Interventions Today    Flowsheet Row Most Recent Value  TOC Interventions   TOC Interventions Discussed/Reviewed TOC Interventions Discussed, S/S of infection, Post op wound/incision care  [Patient declines need for ongoing/ further care coordination outreach,  no care coordination needs identified at time of TOC call today,  provided my direct contact information should questions/ concerns/ needs arise post-TOC call]      Interventions Today    Flowsheet Row Most Recent Value  Chronic Disease   Chronic disease during today's  visit Other  [surgical gastric sleeve gastrectomy- laparoscopic]  General Interventions   General Interventions Discussed/Reviewed General Interventions Discussed, Durable Medical Equipment (DME), Doctor Visits  Doctor Visits Discussed/Reviewed Doctor Visits Discussed, PCP, Specialist  Durable Medical Equipment (DME) BP Cuff, Other  [confirmed not currently requiring/ using assistive devices]  PCP/Specialist Visits Compliance with follow-up visit  Education Interventions   Education Provided Provided Education  Provided Verbal Education On Other  [reinforced post-discharge instructions to continue monitoring/ recording blood pressures at home as per baseline]  Nutrition Interventions   Nutrition Discussed/Reviewed Nutrition Discussed  Pharmacy Interventions   Pharmacy Dicussed/Reviewed Pharmacy Topics Discussed  [Full medication review with updating medication list in EHR per patient report]  Safety Interventions   Safety Discussed/Reviewed Safety Discussed      Caryl Pina, RN, BSN, CCRN Alumnus RN CM Care Coordination/ Transition of Care- [facility] Care Management [phone]: direct office

## 2023-02-04 ENCOUNTER — Telehealth (HOSPITAL_COMMUNITY): Payer: Self-pay | Admitting: *Deleted

## 2023-02-04 NOTE — Telephone Encounter (Signed)
1. Tell me about your pain and pain management?     Pt states that s/he has been experiencing some intermittent abdominal pain (mainly right lower abdominal incisional pain with movement and exertion), but is has been as expected and not intolerable.   Pt can tolerate protein shakes and water. Pt instructed to call [facility] for any concerns.     2. Let's talk about fluid intake. How much total fluid are you taking in?   Pt states that s/he is getting in at least 30oz of fluid including protein shakes, bottled water, and Gatorade Pt instructed to assess status and suggestions daily utilizing Hydration Action Plan on discharge folder and to call [facility] if in the "red zone".  Pt encouraged to set reminders on phone to help with taking sips and avoid being behind on daily fluid goals  3. How much protein have you taken in the last day?    Pt states that she is working to meet the goal of 60g of protein today. Pt aims to drink the reminder of goal throughout the day to meet criteria. Pt is getting in about 30g of protein currently per day.   4. Have you had nausea? Tell me about when you have experienced nausea and what you did to help?   Pt denies nausea.   5. Has the frequency or color changed with your urine?   Pt states that s/he is urinating "fine" with no changes in frequency or urgency.   6. Tell me what your incisions look like?   "Incisions look fine". Pt denies a fever, chills. Pt states incisions are not swollen, open, or draining. Pt encouraged to call [facility] if incisions change. a couple of the steri-strip incisions have come off.     7. Have you been passing gas? BM?   Pt states that they are having BMs.   Pt states that they have had a BM. Pt instructed to take either Miralax or MoM as instructed per "Gastric Bypass/Sleeve Discharge Home Care Instructions". Pt to call surgeon's office if not able to have BM with medication.      8. If a problem or question were to arise who  would you call? Do you know contact numbers for [facility], [facility], and [facility]?   Pt knows to call [facility] for surgical, [facility] for nutrition, and [facility] for non-urgent questions or concerns. Pt denies dehydration symptoms. Pt can describe s/sx of dehydration.   9. How has the walking going?   Pt states s/he is walking around and able to be active without difficulty.   10. Are you still using your incentive spirometer? If so, how often?   Pt states that s/he has not been doing the I.S. Pt encouraged to use incentive spirometer, at least 10x every hour while awake until s/he sees the surgeon.   11. How are your vitamins and calcium going? How are you taking them?     Pt states that s/he is taking her supplements and vitamins as directed. Pt does not like the flavors or texture. Discussed options to take MVI and calcium.

## 2023-02-10 ENCOUNTER — Encounter: Payer: Self-pay | Admitting: Dietician

## 2023-02-10 ENCOUNTER — Encounter: Payer: BC Managed Care – PPO | Attending: General Surgery | Admitting: Dietician

## 2023-02-10 VITALS — Ht 65.0 in | Wt 302.1 lb

## 2023-02-10 DIAGNOSIS — E669 Obesity, unspecified: Secondary | ICD-10-CM | POA: Insufficient documentation

## 2023-02-10 NOTE — Progress Notes (Signed)
2 Week Post-Operative Nutrition Class   Patient was seen on 02/10/2023 for Post-Operative Nutrition education at the [facility]    Surgery date: 01/27/2023 Surgery type: Sleeve Gastrectomy  Anthropometrics  Start weight at [facility]: 324.3 lbs (date: 11/24/2022)  Height: 65 in Weight today: 302.1 lbs   Clinical   Pharmacotherapy: History of weight loss medication used: n/a  Medical hx: obesity, HTN Medications: amlodipine, spironolactone, metformin, cetrizine, iron gummy, multivitamin  Labs: A1c 6.1, Vit B12, iron 26, iron saturation 10, LDL 121, glucose 109, WBC 16.1 Notable signs/symptoms: none noted Any previous deficiencies? No Bowel Habits: Every day to every other day no complaints   Body Composition Scale 01/27/2023  Current Body Weight 302.1  Total Body Fat % 48.3  Visceral Fat 18  Fat-Free Mass % 51.6   Total Body Water % 40.3  Muscle-Mass lbs 33.9  BMI 49.9  Body Fat Displacement          Torso  lbs 90.7         Left Leg  lbs 18.1         Right Leg  lbs 18.1         Left Arm  lbs 9.0         Right Arm  lbs 9.0   The following the learning objectives were met by the patient during this course: Identifies Soft Prepped Plan Advancement Guide  Identifies Soft, High Proteins (Phase 1), beginning 2 weeks post-operatively to 3 weeks post-operatively Identifies Additional Soft High Proteins, soft non-starchy vegetables, fruits and starches (Phase 2), beginning 3 weeks post-operatively to 3 months post-operatively Identifies appropriate sources of fluids, proteins, vegetables, fruits and starches Identifies appropriate fat sources and healthy verses unhealthy fat types   States protein, vegetable, fruit and starch recommendations and appropriate sources post-operatively Identifies the need for appropriate texture modifications, mastication, and bite sizes when consuming solids Identifies appropriate fat consumption and sources Identifies  appropriate multivitamin and calcium sources post-operatively Describes the need for physical activity post-operatively and will follow MD recommendations States when to call healthcare provider regarding medication questions or post-operative complications   Handouts given during class include: Soft Prepped Plan Advancement Guide   Follow-Up Plan: Patient will follow-up at [facility] in 10 weeks for 3 month post-op nutrition visit for diet advancement per MD.

## 2023-02-13 ENCOUNTER — Ambulatory Visit (INDEPENDENT_AMBULATORY_CARE_PROVIDER_SITE_OTHER): Payer: BC Managed Care – PPO | Admitting: Family Medicine

## 2023-02-13 VITALS — BP 118/82 | HR 77 | Temp 97.2°F | Ht 65.0 in | Wt 302.8 lb

## 2023-02-13 DIAGNOSIS — R7303 Prediabetes: Secondary | ICD-10-CM

## 2023-02-13 DIAGNOSIS — I10 Essential (primary) hypertension: Secondary | ICD-10-CM | POA: Diagnosis not present

## 2023-02-13 LAB — CBC WITH DIFFERENTIAL/PLATELET
Absolute Monocytes: 775 cells/uL (ref 200–950)
Eosinophils Relative: 1.3 %
Hemoglobin: 12.7 g/dL (ref 11.7–15.5)
MCH: 26.9 pg — ABNORMAL LOW (ref 27.0–33.0)
MCV: 83.3 fL (ref 80.0–100.0)
MPV: 10 fL (ref 7.5–12.5)
Neutro Abs: 8789 cells/uL — ABNORMAL HIGH (ref 1500–7800)
Platelets: 405 10*3/uL — ABNORMAL HIGH (ref 140–400)
RDW: 13.1 % (ref 11.0–15.0)

## 2023-02-13 NOTE — Progress Notes (Signed)
Established Patient Office Visit   Subjective:  Patient ID: Courtney Mooney, female    DOB: 05-04-1986  Age: 37 y.o. MRN: [id number]  Chief Complaint  Patient presents with   Medical Management of Chronic Issues    6 week follow up. Pt had bariatric surgery on 01/27/2023. Pt states she lost almost 25 pounds.     HPI Encounter Diagnoses  Name Primary?   Essential hypertension Yes   Prediabetes    Fu of dm and htn. Sp gastric sleeve placement 3 wks ago. Doing well. Has lost 20lbs. BP in the <130/80 range. Aldactone dc'd. Not taking metformin..    Review of Systems  Constitutional: Negative.   HENT: Negative.    Eyes:  Negative for blurred vision, discharge and redness.  Respiratory: Negative.    Cardiovascular: Negative.   Gastrointestinal:  Positive for constipation. Negative for abdominal pain, blood in stool, diarrhea, melena, nausea and vomiting.  Genitourinary: Negative.   Musculoskeletal: Negative.  Negative for myalgias.  Skin:  Negative for rash.  Neurological:  Negative for tingling, loss of consciousness and weakness.  Endo/Heme/Allergies:  Negative for polydipsia.     Current Outpatient Medications:    amLODipine (NORVASC) 5 MG tablet, Take 1 tablet (5 mg total) by mouth daily., Disp: 90 tablet, Rfl: 1   cetirizine (ZYRTEC) 10 MG tablet, Take 10 mg by mouth daily., Disp: , Rfl:    gabapentin (NEURONTIN) 100 MG capsule, Take 2 capsules (200 mg total) by mouth every 12 (twelve) hours., Disp: 20 capsule, Rfl: 0   Multiple Vitamin (MULTIVITAMIN) capsule, Take 1 capsule by mouth daily., Disp: , Rfl:    omeprazole (PRILOSEC OTC) 20 MG tablet, Take 20 mg by mouth daily., Disp: , Rfl:    omeprazole (PRILOSEC) 20 MG capsule, Take 20 mg by mouth daily., Disp: , Rfl:    ondansetron (ZOFRAN-ODT) 4 MG disintegrating tablet, Take 1 tablet (4 mg total) by mouth every 6 (six) hours as needed for nausea or vomiting., Disp: 20 tablet, Rfl: 0   FERROUS SULFATE PO, Take 1 tablet by  mouth daily. (Patient not taking: Reported on 01/30/2023), Disp: , Rfl:    oxyCODONE (OXY IR/ROXICODONE) 5 MG immediate release tablet, Take 1 tablet (5 mg total) by mouth every 6 (six) hours as needed for severe pain. (Patient not taking: Reported on 02/13/2023), Disp: 10 tablet, Rfl: 0   pantoprazole (PROTONIX) 40 MG tablet, Take 1 tablet (40 mg total) by mouth daily. (Patient not taking: Reported on 01/30/2023), Disp: 90 tablet, Rfl: 0   Objective:     BP 118/82   Pulse 77   Temp (!) 97.2 F (36.2 C)   Ht 5\' 5"  (1.651 m)   Wt (!) 302 lb 12.8 oz (137.3 kg)   LMP 01/25/2023   SpO2 97%   BMI 50.39 kg/m    Physical Exam Constitutional:      General: She is not in acute distress.    Appearance: Normal appearance. She is not ill-appearing, toxic-appearing or diaphoretic.  HENT:     Head: Normocephalic and atraumatic.     Right Ear: External ear normal.     Left Ear: External ear normal.     Mouth/Throat:     Mouth: Mucous membranes are moist.     Pharynx: Oropharynx is clear. No oropharyngeal exudate or posterior oropharyngeal erythema.  Eyes:     General: No scleral icterus.       Right eye: No discharge.        Left eye:  No discharge.     Extraocular Movements: Extraocular movements intact.     Conjunctiva/sclera: Conjunctivae normal.     Pupils: Pupils are equal, round, and reactive to light.  Cardiovascular:     Rate and Rhythm: Normal rate and regular rhythm.  Pulmonary:     Effort: Pulmonary effort is normal. No respiratory distress.     Breath sounds: Normal breath sounds.  Abdominal:     General: Bowel sounds are normal. There is no distension.     Tenderness: There is no abdominal tenderness. There is no guarding or rebound.    Musculoskeletal:     Cervical back: No rigidity or tenderness.  Skin:    General: Skin is warm and dry.  Neurological:     Mental Status: She is alert and oriented to person, place, and time.  Psychiatric:        Mood and Affect: Mood  normal.        Behavior: Behavior normal.      No results found for any visits on 02/13/23.    The ASCVD Risk score (Arnett DK, et al., 2019) failed to calculate for the following reasons:   The 2019 ASCVD risk score is only valid for ages 67 to 66    Assessment & Plan:   Essential hypertension -     CBC with Differential/Platelet -     Basic metabolic panel  Prediabetes -     Hemoglobin A1c    Return in about 3 months (around 05/16/2023).   Will continue weight loss efforts and amlodipine. Hold metformin if A1c is < 6.2.  Mliss Sax, MD

## 2023-02-14 LAB — BASIC METABOLIC PANEL
BUN: 17 mg/dL (ref 7–25)
CO2: 25 mmol/L (ref 20–32)
Calcium: 9.1 mg/dL (ref 8.6–10.2)
Chloride: 101 mmol/L (ref 98–110)
Creat: 0.82 mg/dL (ref 0.50–0.97)
Glucose, Bld: 75 mg/dL (ref 65–99)
Potassium: 4.1 mmol/L (ref 3.5–5.3)
Sodium: 140 mmol/L (ref 135–146)

## 2023-02-14 LAB — CBC WITH DIFFERENTIAL/PLATELET
Basophils Absolute: 57 cells/uL (ref 0–200)
Basophils Relative: 0.5 %
Eosinophils Absolute: 148 cells/uL (ref 15–500)
HCT: 39.3 % (ref 35.0–45.0)
Lymphs Abs: 1630 cells/uL (ref 850–3900)
MCHC: 32.3 g/dL (ref 32.0–36.0)
Monocytes Relative: 6.8 %
Neutrophils Relative %: 77.1 %
RBC: 4.72 10*6/uL (ref 3.80–5.10)
Total Lymphocyte: 14.3 %
WBC: 11.4 10*3/uL — ABNORMAL HIGH (ref 3.8–10.8)

## 2023-02-14 LAB — HEMOGLOBIN A1C
Hgb A1c MFr Bld: 6 % of total Hgb — ABNORMAL HIGH (ref <5.7)
Mean Plasma Glucose: 126 mg/dL
eAG (mmol/L): 7 mmol/L

## 2023-02-19 ENCOUNTER — Telehealth: Payer: Self-pay | Admitting: Dietician

## 2023-02-19 NOTE — Telephone Encounter (Signed)
RD called pt to verify fluid intake once starting soft, solid proteins 2 week post-bariatric surgery.   Daily Fluid intake: 50 oz. Daily Protein intake: 60 grams Bowel Habits: better; doing well now; Miralax daily and magnesium oxide or citrate  Concerns/issues: tolerating chicken; pt states fluid is getting better

## 2023-03-09 ENCOUNTER — Other Ambulatory Visit: Payer: Self-pay | Admitting: Family Medicine

## 2023-03-09 DIAGNOSIS — I10 Essential (primary) hypertension: Secondary | ICD-10-CM

## 2023-04-02 ENCOUNTER — Ambulatory Visit (HOSPITAL_COMMUNITY): Payer: BC Managed Care – PPO | Admitting: Licensed Clinical Social Worker

## 2023-04-28 ENCOUNTER — Other Ambulatory Visit: Payer: Self-pay

## 2023-04-30 ENCOUNTER — Encounter: Payer: BC Managed Care – PPO | Attending: General Surgery | Admitting: Dietician

## 2023-04-30 ENCOUNTER — Encounter: Payer: Self-pay | Admitting: Dietician

## 2023-04-30 VITALS — Ht 65.0 in | Wt 277.7 lb

## 2023-04-30 DIAGNOSIS — E669 Obesity, unspecified: Secondary | ICD-10-CM | POA: Insufficient documentation

## 2023-04-30 NOTE — Progress Notes (Signed)
Bariatric Nutrition Follow-Up Visit Medical Nutrition Therapy  Appt Start Time: 2:00   End Time: 2:28   Surgery date: 01/27/2023 Surgery type: Sleeve Gastrectomy  NUTRITION ASSESSMENT  Anthropometrics  Start weight at [facility]: 324.3 lbs (date: 11/24/2022)  Height: 65 in Weight today: 277.7 lbs   Clinical   Medical hx: obesity, HTN Medications: amlodipine, spironolactone, metformin, cetrizine, iron gummy, multivitamin  Labs: A1c 6.1, Vit B12, iron 26, iron saturation 10, LDL 121, glucose 109, WBC 40.9 Notable signs/symptoms: none noted Any previous deficiencies? No Bowel Habits: Every day to every other day no complaints   Body Composition Scale 01/27/2023 04/30/2023  Current Body Weight 302.1 277.7  Total Body Fat % 48.3 46.6  Visceral Fat 18 16  Fat-Free Mass % 51.6 53.3   Total Body Water % 40.3 41.1  Muscle-Mass lbs 33.9 33.6  BMI 49.9 45.9  Body Fat Displacement           Torso  lbs 90.7 80.3         Left Leg  lbs 18.1 16.0         Right Leg  lbs 18.1 16.0         Left Arm  lbs 9.0 8.0         Right Arm  lbs 9.0 8.0     Lifestyle & Dietary Hx  Pt states she can tell she is losing weight, stating all her scrubs she wears for work are all too big. Pt states her back does not hurt anymore. Pt states she is not tracking her protein. Pt states she puts her portion on a baby bowl/plate. Pt states constipation has gotten better. Pt states she has some resistance bands and kettle bells for resistance exercise.  Estimated daily fluid intake: 64 oz (most days) Estimated daily protein intake: not tracking Supplements: multivitamin and calcium Current average weekly physical activity: walk on lunch break for 30 minutes, 5 days a week   24-Hr Dietary Recall First Meal: [place] sausage and grits or eggs with [place] sausage Snack: yogurt  Second Meal: left overs like hamburger pattie with squash Snack: yogurt  Third Meal: hamburger patty with squash Snack: sf pop  cycle Beverages: water or water with sf flavorings  Post-Op Goals/ Signs/ Symptoms Using straws: no Drinking while eating: no Chewing/swallowing difficulties: no Changes in vision: no Changes to mood/headaches: no Hair loss/changes to skin/nails: no Difficulty focusing/concentrating: no Sweating: no Limb weakness: no Dizziness/lightheadedness: no Palpitations: no  Carbonated/caffeinated beverages: no N/V/D/C/Gas: no Abdominal pain: no Dumping syndrome: no   NUTRITION DIAGNOSIS  Overweight/obesity ([place]-3.3) related to past poor dietary habits and physical inactivity as evidenced by completed bariatric surgery and following dietary guidelines for continued weight loss and healthy nutrition status.   NUTRITION INTERVENTION Nutrition counseling (C-1) and education (E-2) to facilitate bariatric surgery goals, including: Diet advancement to the standard prep plan The importance of consuming adequate calories as well as certain nutrients daily due to the body's need for essential vitamins, minerals, and fats The importance of daily physical activity and to reach a goal of at least 150 minutes of moderate to vigorous physical activity weekly (or as directed by their physician) due to benefits such as increased musculature and improved lab values The importance of intuitive eating specifically learning hunger-satiety cues and understanding the importance of learning a new body: The importance of mindful eating to avoid grazing behaviors   Goals Track Protein Increase physical activity; add in resistance activities (resistance bands and kettle bells at home)  Handouts Provided Include  Standard Prep Plan Advancement Guide  Learning Style & Readiness for Change Teaching method utilized: Visual & Auditory  Demonstrated degree of understanding via: Teach Back  Readiness Level: Action Barriers to learning/adherence to lifestyle change: nothing identified  RD's Notes for Next Visit Assess  adherence to pt chosen goals  MONITORING & EVALUATION Dietary intake, weekly physical activity, body weight.  Next Steps Patient is to follow-up in 3 months for 6 month post-op follow-up.

## 2023-05-07 ENCOUNTER — Encounter: Payer: Self-pay | Admitting: Family Medicine

## 2023-05-08 NOTE — Telephone Encounter (Signed)
PAPERWORK/FORMS received  Dropped off by: Submitted via MyChart Call back #: [phone] Individual made aware of 3-5 business day turn around (YES/NO): Y GREEN charge sheet completed and patient made aware of possible charge (YES/NO): N Placed in provider folder at front desk. ~~~ route to CMA/provider Team  CLINICAL USE BELOW THIS LINE (use X to signify action taken)  __X_ Form received and placed in providers office for signature. ___ Form completed and faxed to LOA Dept.  ___ Form completed & LVM to notify patient ready for pick up.  ___ Charge sheet and copy of form in front office folder for office supervisor.

## 2023-05-18 ENCOUNTER — Ambulatory Visit: Payer: BC Managed Care – PPO | Admitting: Family Medicine

## 2023-05-21 DIAGNOSIS — E569 Vitamin deficiency, unspecified: Secondary | ICD-10-CM | POA: Diagnosis not present

## 2023-05-21 DIAGNOSIS — I10 Essential (primary) hypertension: Secondary | ICD-10-CM | POA: Diagnosis not present

## 2023-05-21 DIAGNOSIS — Z9884 Bariatric surgery status: Secondary | ICD-10-CM | POA: Diagnosis not present

## 2023-05-25 ENCOUNTER — Ambulatory Visit: Payer: BC Managed Care – PPO | Admitting: Skilled Nursing Facility1

## 2023-05-29 DIAGNOSIS — I10 Essential (primary) hypertension: Secondary | ICD-10-CM | POA: Diagnosis not present

## 2023-05-29 DIAGNOSIS — E569 Vitamin deficiency, unspecified: Secondary | ICD-10-CM | POA: Diagnosis not present

## 2023-05-29 DIAGNOSIS — Z9884 Bariatric surgery status: Secondary | ICD-10-CM | POA: Diagnosis not present

## 2023-06-01 ENCOUNTER — Other Ambulatory Visit: Payer: Self-pay | Admitting: Family Medicine

## 2023-06-01 ENCOUNTER — Other Ambulatory Visit (HOSPITAL_COMMUNITY): Payer: Self-pay

## 2023-06-01 DIAGNOSIS — I10 Essential (primary) hypertension: Secondary | ICD-10-CM

## 2023-06-01 MED ORDER — AMLODIPINE BESYLATE 5 MG PO TABS
5.0000 mg | ORAL_TABLET | Freq: Every day | ORAL | 0 refills | Status: DC
Start: 2023-06-01 — End: 2023-08-07
  Filled 2023-06-01: qty 90, 90d supply, fill #0

## 2023-06-02 ENCOUNTER — Other Ambulatory Visit (HOSPITAL_COMMUNITY): Payer: Self-pay

## 2023-06-29 IMAGING — US US PELVIS COMPLETE WITH TRANSVAGINAL
1 series · 13 of 25 positions shown · non-contrast
Comparison: None

CLINICAL DATA: Dysmenorrhea, LMP 03/06/2021

EXAM:
TRANSABDOMINAL AND TRANSVAGINAL ULTRASOUND OF PELVIS
TECHNIQUE: Both transabdominal and transvaginal ultrasound examinations of the
pelvis were performed. Transabdominal technique was performed for
global imaging of the pelvis including uterus, ovaries, adnexal
regions, and pelvic cul-de-sac. It was necessary to proceed with
endovaginal exam following the transabdominal exam to visualize the
uterus, endometrium, and ovaries. Transabdominal imaging limited by
decompressed urinary bladder and body habitus.

[Series 1: us pelvis complete with transvaginal · 13 of 100 slices shown]
[im 1/100]
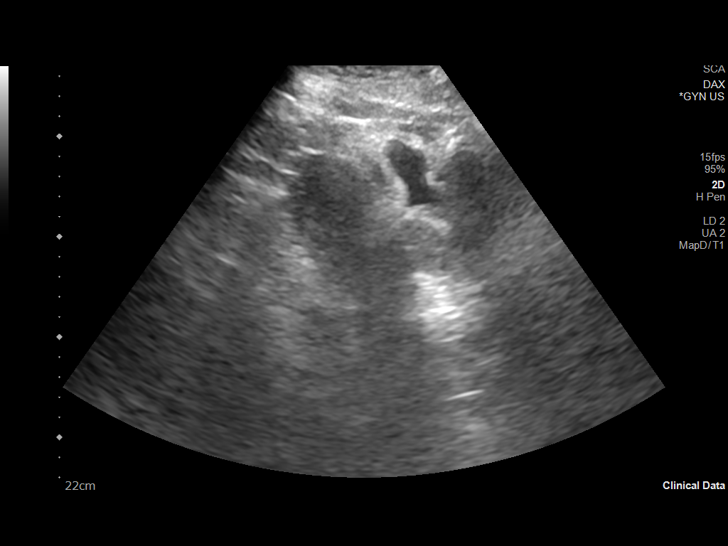
[im 9/100]
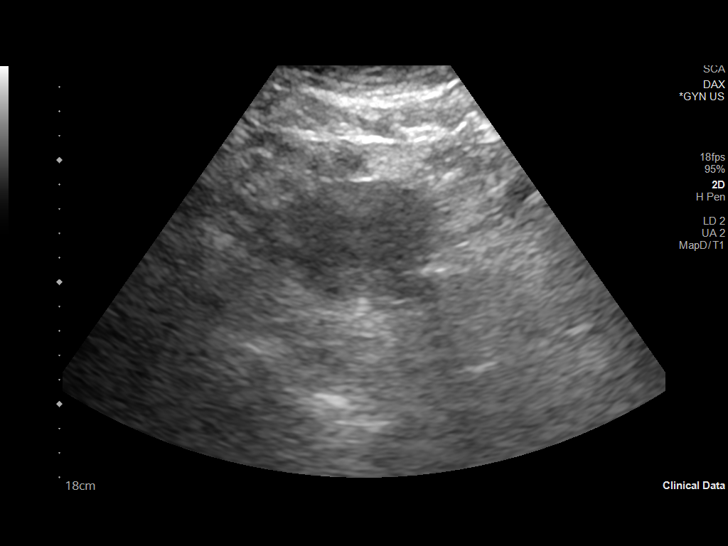
[im 17/100]
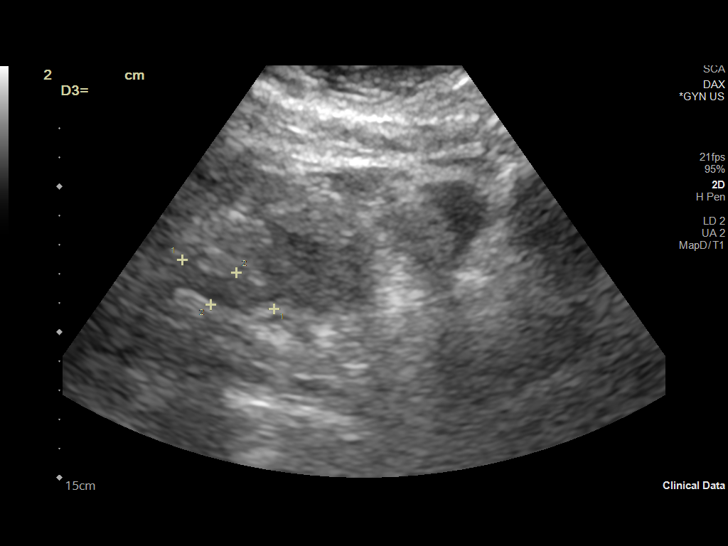
[im 25/100]
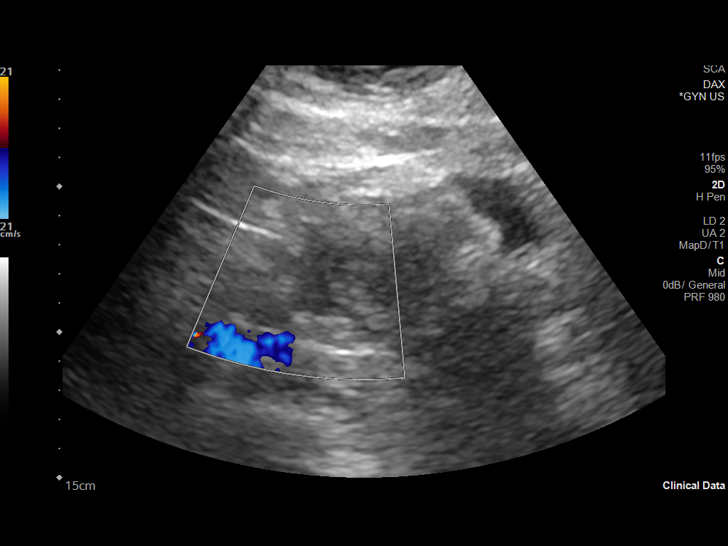
[im 34/100]
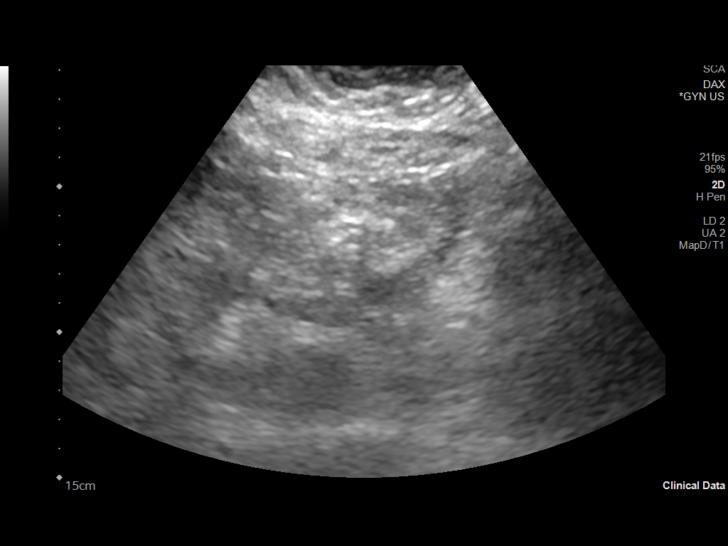
[im 42/100]
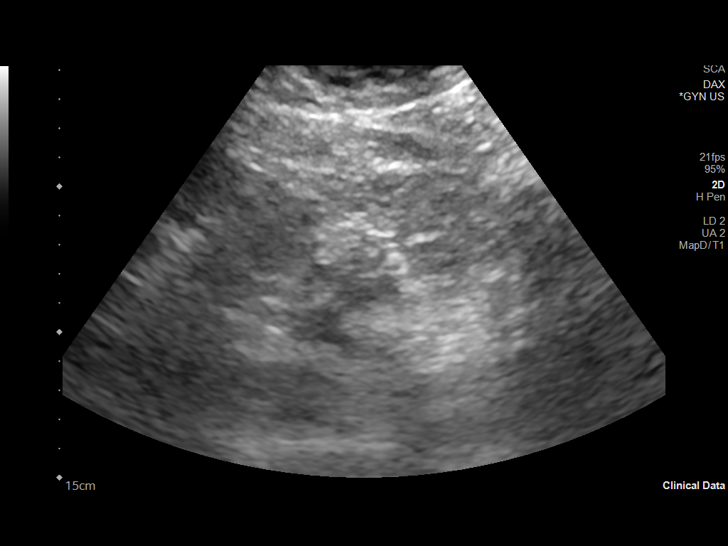
[im 50/100]
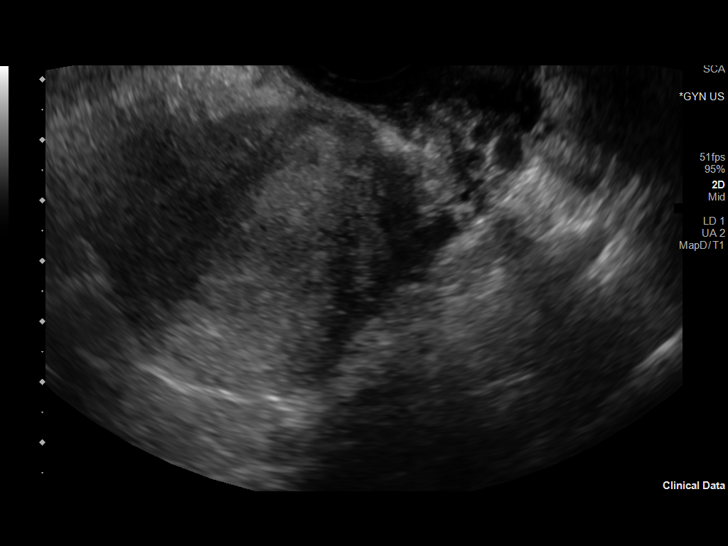
[im 58/100]
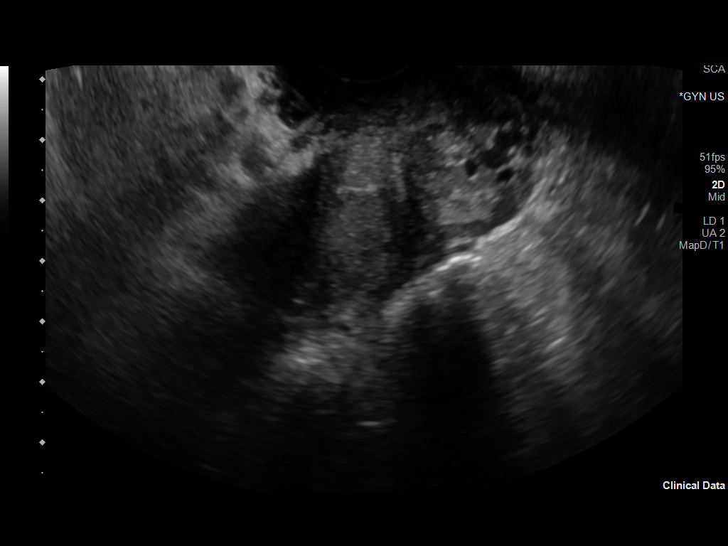
[im 67/100]
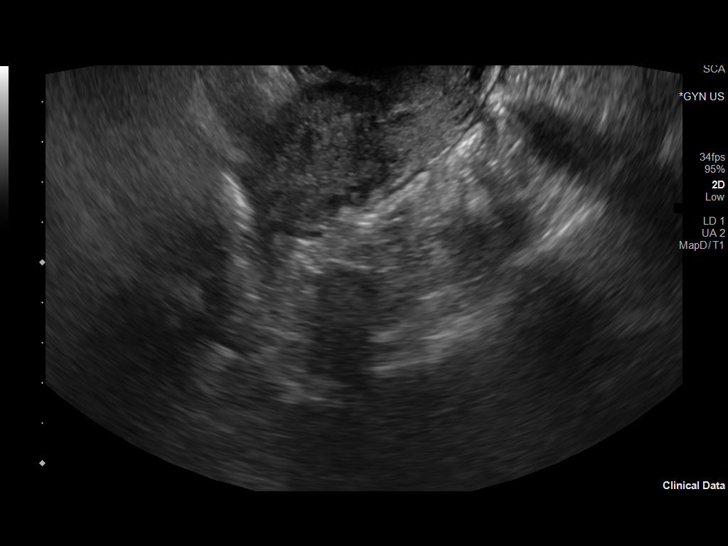
[im 75/100]
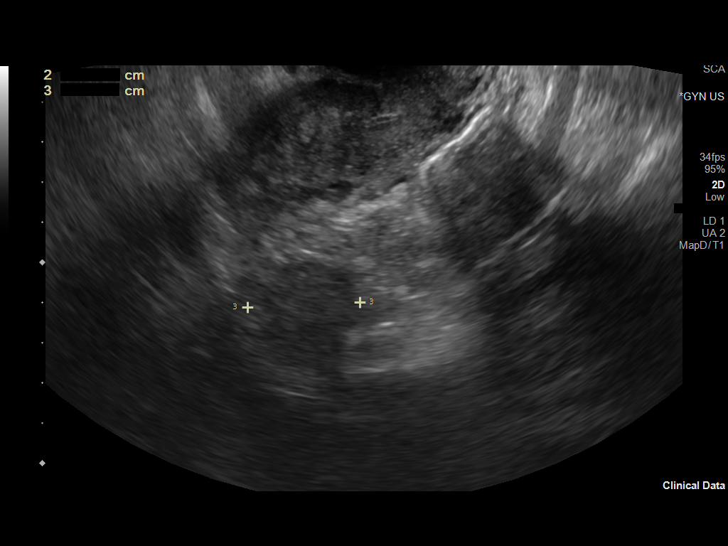
[im 83/100]
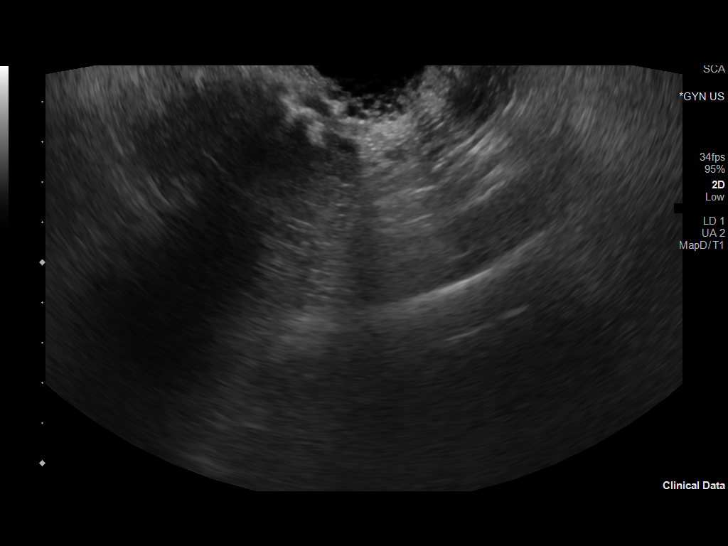
[im 91/100]
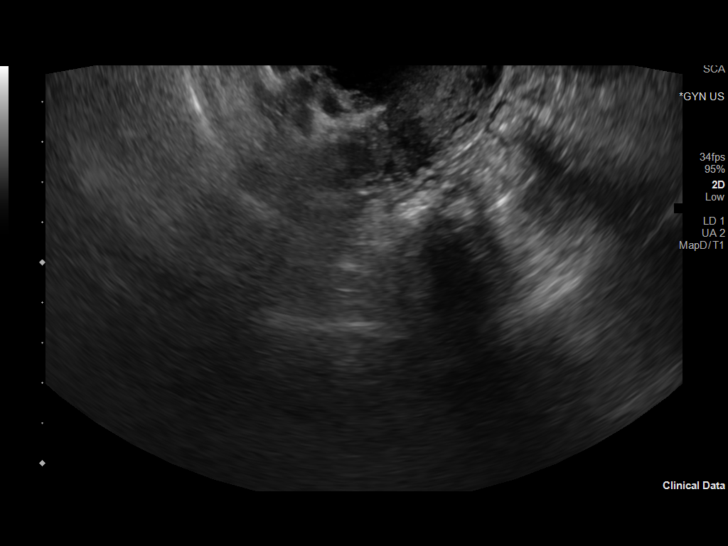
[im 100/100]
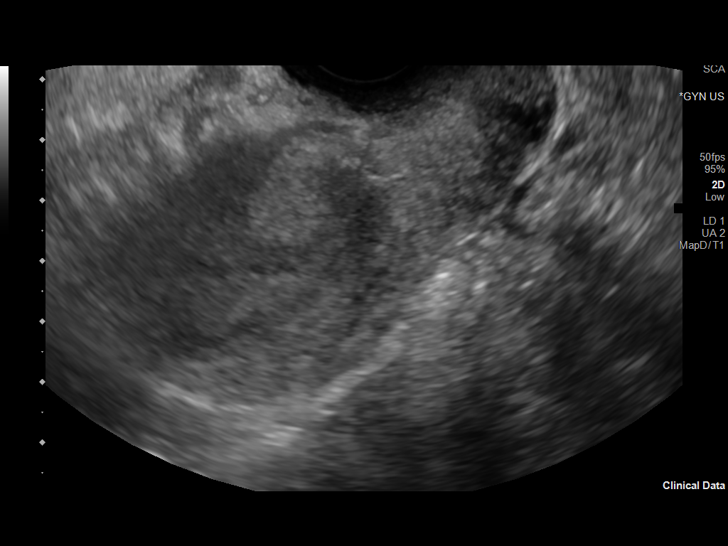

[13 of 25 positions shown; findings below may reference images not displayed]

FINDINGS: Uterus

Measurements: 8.0 x 4.6 x 4.8 cm = volume: 91 mL. Anteverted.
Heterogeneous myometrium. Anterior wall intramural leiomyoma 2.0 x
2.0 x 1.8 cm. No additional discrete masses.

Endometrium

Thickness: 6 mm.  No endometrial fluid or focal abnormality

Right ovary

Measurements: 3.1 x 1.5 x 2.8 cm = volume: 7.0 mL. Normal morphology
without mass

Left ovary

Measurements: 5.1 x 2.4 x 1.7 cm = volume: 10.7 mL. Normal
morphology without mass

Other findings

Trace free pelvic fluid.  No adnexal masses.
IMPRESSION: Single intramural leiomyoma anterior uterus 2.0 cm diameter.

Remainder of exam unremarkable.

## 2023-08-03 ENCOUNTER — Encounter: Payer: BC Managed Care – PPO | Attending: General Surgery | Admitting: Dietician

## 2023-08-03 VITALS — Ht 65.0 in | Wt 263.7 lb

## 2023-08-03 DIAGNOSIS — E669 Obesity, unspecified: Secondary | ICD-10-CM | POA: Insufficient documentation

## 2023-08-03 NOTE — Progress Notes (Signed)
 Bariatric Nutrition Follow-Up Visit Medical Nutrition Therapy  Appt Start Time: 1352  End Time: 1424   Surgery date: 01/27/2023 Surgery type: Sleeve Gastrectomy  NUTRITION ASSESSMENT  Anthropometrics  Start weight at [facility]: 324.3 lbs (date: 11/24/2022)  Height: 65 in Weight today: 263.7 lbs   Clinical   Medical hx: obesity, HTN Medications: see list  Labs: A1c 6.1, Vit B12, iron 26, iron saturation 10, LDL 121, glucose 109, WBC 88.6 Notable signs/symptoms: none noted Any previous deficiencies? No Bowel Habits: Every day to every other day no complaints   Body Composition Scale 01/27/2023 04/30/2023 08/03/2023  Current Body Weight 302.1 277.7 263.7  Total Body Fat % 48.3 46.6 45.4  Visceral Fat 18 16 15   Fat-Free Mass % 51.6 53.3 54.5   Total Body Water  % 40.3 41.1 41.7  Muscle-Mass lbs 33.9 33.6 33.4  BMI 49.9 45.9 43.5  Body Fat Displacement            Torso  lbs 90.7 80.3 74.2         Left Leg  lbs 18.1 16.0 14.8         Right Leg  lbs 18.1 16.0 14.8         Left Arm  lbs 9.0 8.0 7.4         Right Arm  lbs 9.0 8.0 7.4     Lifestyle & Dietary Hx  Pt states she is consistent with her fluid intake, stating most days she gets to 64 oz. Pt states sometimes she forgets to take her calcium supplements, stating she is going to set alarms in her phone. Pt states she needs to improve her meal planning and prepping. Pt states she needs to start tracking protein to see where she is at right now with her protein food intake.  Estimated daily fluid intake: 64 oz (most days) Estimated daily protein intake: not tracking Supplements: multivitamin and calcium Current average weekly physical activity: participating in BELT, stating she will end that in February.  24-Hr Dietary Recall First Meal: turkey sausage and grits or eggs, or plain greek yogurt with fruit or  whole grain thins Snack: yogurt with unflavored protein powder Second Meal: baked pork chops and broccoli Snack:  yogurt  Third Meal: baked pork chops and broccoli Snack: not usually, sometimes popcorn or fruit Beverages: water  or water  with sf flavorings  Post-Op Goals/ Signs/ Symptoms Using straws: no Drinking while eating: no Chewing/swallowing difficulties: no Changes in vision: no Changes to mood/headaches: no Hair loss/changes to skin/nails: no Difficulty focusing/concentrating: no Sweating: no Limb weakness: no Dizziness/lightheadedness: no Palpitations: no  Carbonated/caffeinated beverages: no N/V/D/C/Gas: no Abdominal pain: no Dumping syndrome: no   NUTRITION DIAGNOSIS  Overweight/obesity ([place]-3.3) related to past poor dietary habits and physical inactivity as evidenced by completed bariatric surgery and following dietary guidelines for continued weight loss and healthy nutrition status.   NUTRITION INTERVENTION Nutrition counseling (C-1) and education (E-2) to facilitate bariatric surgery goals, including: Diet advancement to the standard prep plan The importance of consuming adequate calories as well as certain nutrients daily due to the body's need for essential vitamins, minerals, and fats The importance of daily physical activity and to reach a goal of at least 150 minutes of moderate to vigorous physical activity weekly (or as directed by their physician) due to benefits such as increased musculature and improved lab values The importance of intuitive eating specifically learning hunger-satiety cues and understanding the importance of learning a new body: The importance of mindful eating to  avoid grazing behaviors  Encouraged patient to honor their body's internal hunger and fullness cues.  Throughout the day, check in mentally and rate hunger. Stop eating when satisfied not full regardless of how much food is left on the plate.  Get more if still hungry 20-30 minutes later.  The key is to honor satisfaction so throughout the meal, rate fullness factor and stop when comfortably  satisfied not physically full. The key is to honor hunger and fullness without any feelings of guilt or shame.  Pay attention to what the internal cues are, rather than any external factors. This will enhance the confidence you have in listening to your own body and following those internal cues enabling you to increase how often you eat when you are hungry not out of appetite and stop when you are satisfied not full.  Encouraged pt to continue to eat balanced meals inclusive of non starchy vegetables 2 times a day 7 days a week Encouraged pt to choose lean protein sources: limiting beef, pork, sausage, hotdogs, and lunch meat Encourage pt to choose healthy fats such as plant based limiting animal fats Encouraged pt to continue to drink a minium 64 fluid ounces with half being plain water  to satisfy proper hydration    Goals Re-engage: Track Protein; aim for protein from food Continue: Increase physical activity; add in resistance activities (resistance bands and kettle bells at home); gym membership for after BELT is over New: meal planning and prepping  Handouts Provided Include  Bariatric 6 Months to Maintenance Healthy Eating Guide   Learning Style & Readiness for Change Teaching method utilized: Visual & Auditory  Demonstrated degree of understanding via: Teach Back  Readiness Level: Action Barriers to learning/adherence to lifestyle change: nothing identified  RD's Notes for Next Visit Assess adherence to pt chosen goals  MONITORING & EVALUATION Dietary intake, weekly physical activity, body weight.  Next Steps Patient is to follow-up in 6 months for 1 year post-op follow-up.

## 2023-08-07 ENCOUNTER — Encounter: Payer: Self-pay | Admitting: Family Medicine

## 2023-08-07 ENCOUNTER — Ambulatory Visit: Payer: BC Managed Care – PPO | Admitting: Family Medicine

## 2023-08-07 ENCOUNTER — Other Ambulatory Visit (HOSPITAL_COMMUNITY): Payer: Self-pay

## 2023-08-07 VITALS — BP 124/78 | HR 88 | Temp 98.0°F | Ht 65.0 in | Wt 266.0 lb

## 2023-08-07 DIAGNOSIS — I10 Essential (primary) hypertension: Secondary | ICD-10-CM | POA: Diagnosis not present

## 2023-08-07 DIAGNOSIS — R7303 Prediabetes: Secondary | ICD-10-CM | POA: Diagnosis not present

## 2023-08-07 LAB — HEMOGLOBIN A1C: Hgb A1c MFr Bld: 6 % (ref 4.6–6.5)

## 2023-08-07 LAB — BASIC METABOLIC PANEL
BUN: 20 mg/dL (ref 6–23)
CO2: 29 meq/L (ref 19–32)
Calcium: 9.1 mg/dL (ref 8.4–10.5)
Chloride: 105 meq/L (ref 96–112)
Creatinine, Ser: 0.81 mg/dL (ref 0.40–1.20)
GFR: 91.48 mL/min (ref >60.00)
Glucose, Bld: 93 mg/dL (ref 70–99)
Potassium: 3.7 meq/L (ref 3.5–5.1)
Sodium: 140 meq/L (ref 135–145)

## 2023-08-07 MED ORDER — AMLODIPINE BESYLATE 5 MG PO TABS
5.0000 mg | ORAL_TABLET | Freq: Every day | ORAL | 0 refills | Status: DC
Start: 2023-08-07 — End: 2023-11-25
  Filled 2023-08-07 – 2023-08-08 (×2): qty 90, 90d supply, fill #0

## 2023-08-07 NOTE — Progress Notes (Signed)
 Established Patient Office Visit   Subjective:  Patient ID: Courtney Mooney, female    DOB: 11/11/1985  Age: 38 y.o. MRN: [id number]  Chief Complaint  Patient presents with   Hypertension    Follow up.     Hypertension Pertinent negatives include no blurred vision.   Encounter Diagnoses  Name Primary?   Prediabetes Yes   Essential hypertension    Down 36 pounds since last office visit!  Continues to do well status post gastric sleeve resection back in July 2024.  So far surgery is checking nutritional levels.  She is working out.  Continues with amlodipine  5 mg for hypertension.   Review of Systems  Constitutional: Negative.   HENT: Negative.    Eyes:  Negative for blurred vision, discharge and redness.  Respiratory: Negative.    Cardiovascular: Negative.   Gastrointestinal:  Negative for abdominal pain.  Genitourinary: Negative.   Musculoskeletal: Negative.  Negative for myalgias.  Skin:  Negative for rash.  Neurological:  Negative for tingling, loss of consciousness and weakness.  Endo/Heme/Allergies:  Negative for polydipsia.     Current Outpatient Medications:    cetirizine (ZYRTEC) 10 MG tablet, Take 10 mg by mouth daily., Disp: , Rfl:    Multiple Vitamin (MULTIVITAMIN) capsule, Take 1 capsule by mouth daily., Disp: , Rfl:    omeprazole (PRILOSEC OTC) 20 MG tablet, Take 20 mg by mouth daily., Disp: , Rfl:    omeprazole (PRILOSEC) 20 MG capsule, Take 20 mg by mouth daily., Disp: , Rfl:    ondansetron  (ZOFRAN -ODT) 4 MG disintegrating tablet, Take 1 tablet (4 mg total) by mouth every 6 (six) hours as needed for nausea or vomiting., Disp: 20 tablet, Rfl: 0   amLODipine  (NORVASC ) 5 MG tablet, Take 1 tablet (5 mg total) by mouth daily., Disp: 90 tablet, Rfl: 0   FERROUS SULFATE PO, Take 1 tablet by mouth daily. (Patient not taking: Reported on 08/07/2023), Disp: , Rfl:    gabapentin  (NEURONTIN ) 100 MG capsule, Take 2 capsules (200 mg total) by mouth every 12 (twelve)  hours., Disp: 20 capsule, Rfl: 0   oxyCODONE  (OXY IR/ROXICODONE ) 5 MG immediate release tablet, Take 1 tablet (5 mg total) by mouth every 6 (six) hours as needed for severe pain. (Patient not taking: Reported on 08/07/2023), Disp: 10 tablet, Rfl: 0   pantoprazole  (PROTONIX ) 40 MG tablet, Take 1 tablet (40 mg total) by mouth daily. (Patient not taking: Reported on 08/07/2023), Disp: 90 tablet, Rfl: 0   Objective:     BP 124/78   Pulse 88   Temp 98 F (36.7 C)   Ht 5' 5 (1.651 m)   Wt 266 lb (120.7 kg)   BMI 44.26 kg/m  BP Readings from Last 3 Encounters:  08/07/23 124/78  02/13/23 118/82  01/28/23 132/80   Wt Readings from Last 3 Encounters:  08/07/23 266 lb (120.7 kg)  08/03/23 263 lb 11.2 oz (119.6 kg)  04/30/23 277 lb 11.2 oz (126 kg)      Physical Exam Constitutional:      General: She is not in acute distress.    Appearance: Normal appearance. She is not ill-appearing, toxic-appearing or diaphoretic.  HENT:     Head: Normocephalic and atraumatic.     Right Ear: External ear normal.     Left Ear: External ear normal.     Mouth/Throat:     Mouth: Mucous membranes are moist.     Pharynx: Oropharynx is clear. No oropharyngeal exudate or posterior oropharyngeal erythema.  Eyes:     General: No scleral icterus.       Right eye: No discharge.        Left eye: No discharge.     Extraocular Movements: Extraocular movements intact.     Conjunctiva/sclera: Conjunctivae normal.     Pupils: Pupils are equal, round, and reactive to light.  Cardiovascular:     Rate and Rhythm: Normal rate and regular rhythm.  Pulmonary:     Effort: Pulmonary effort is normal. No respiratory distress.     Breath sounds: Normal breath sounds. No wheezing or rales.  Musculoskeletal:     Cervical back: No rigidity or tenderness.  Skin:    General: Skin is warm and dry.  Neurological:     Mental Status: She is alert and oriented to person, place, and time.  Psychiatric:        Mood and Affect:  Mood normal.        Behavior: Behavior normal.      No results found for any visits on 08/07/23.    The ASCVD Risk score (Arnett DK, et al., 2019) failed to calculate for the following reasons:   The 2019 ASCVD risk score is only valid for ages 47 to 18    Assessment & Plan:   Prediabetes -     Basic metabolic panel -     Hemoglobin A1c  Essential hypertension -     amLODIPine  Besylate; Take 1 tablet (5 mg total) by mouth daily.  Dispense: 90 tablet; Refill: 0 -     Basic metabolic panel    Return in about 6 months (around 02/04/2024).    Elsie Sim Lent, MD

## 2023-08-08 ENCOUNTER — Other Ambulatory Visit (HOSPITAL_COMMUNITY): Payer: Self-pay

## 2023-08-10 ENCOUNTER — Other Ambulatory Visit (HOSPITAL_COMMUNITY): Payer: Self-pay

## 2023-09-22 ENCOUNTER — Ambulatory Visit: Payer: Self-pay | Admitting: Family Medicine

## 2023-09-22 NOTE — Telephone Encounter (Signed)
 Copied from CRM [id number]. Topic: Clinical - Red Word Triage >> Sep 22, 2023  8:30 AM Gurney Maxin H wrote: [facility] that prompted transfer to Nurse Triage: Diagnosed with bursitis in left shoulder having a nagging achy pain in shoulder, especially with movement. Pain a 7   Chief Complaint: Shoulder Pain Symptoms: Shoulder Pain Frequency: Acute Pertinent Negatives: Patient denies numbness, weakness, swelling, or color changes.  Disposition: [] ED /[] Urgent Care (no appt availability in office) / [x] Appointment(In office/virtual)/ []  [facility]/ [] Home Care/ [] Refused Recommended Disposition /[] [facility]/ []  Follow-up with PCP Additional Notes: [facility] contacted via phone regarding concerns of Left Shoulder Pain. Discussed symptoms, severity, and duration. Based on assessment, patient was advised to see PCP in office. The patient reports a nagging, aching shoulder pain that is constant and exacerbated by every movement. Diclofenac is no longer providing relief. Patient verbalized understanding and agreement with plan. Documentation provided.     Reason for Disposition  [1] MODERATE pain (e.g., interferes with normal activities) AND [2] present > 3 days  Answer Assessment - Initial Assessment Questions 1. ONSET: "When did the pain start?"     Since November  2. LOCATION: "Where is the pain located?"     Left Shoulder  3. PAIN: "How bad is the pain?" (Scale 1-10; or mild, moderate, severe)   - MILD (1-3): doesn't interfere with normal activities   - MODERATE (4-7): interferes with normal activities (e.g., work or school) or awakens from sleep   - SEVERE (8-10): excruciating pain, unable to do any normal activities, unable to move arm at all due to pain     7  4. WORK OR EXERCISE: "Has there been any recent work or exercise that involved this part of the body?"     Reports working out, but has reduced amount of weight  5. CAUSE: "What do you think is causing the shoulder  pain?"     Unsure, any movement exacerbates pain  6. OTHER SYMPTOMS: "Do you have any other symptoms?" (e.g., neck pain, swelling, rash, fever, numbness, weakness)     Sore, Tender  7. PREGNANCY: "Is there any chance you are pregnant?" "When was your last menstrual period?"     No, Now  Protocols used: Shoulder Pain-A-AH

## 2023-09-24 ENCOUNTER — Ambulatory Visit: Payer: BC Managed Care – PPO | Admitting: Family Medicine

## 2023-11-25 ENCOUNTER — Other Ambulatory Visit: Payer: Self-pay

## 2023-11-25 ENCOUNTER — Other Ambulatory Visit: Payer: Self-pay | Admitting: Family Medicine

## 2023-11-25 DIAGNOSIS — I10 Essential (primary) hypertension: Secondary | ICD-10-CM

## 2023-11-25 MED ORDER — AMLODIPINE BESYLATE 5 MG PO TABS
5.0000 mg | ORAL_TABLET | Freq: Every day | ORAL | 0 refills | Status: DC
Start: 2023-11-25 — End: 2024-02-04
  Filled 2023-11-26: qty 90, 90d supply, fill #0

## 2023-11-26 ENCOUNTER — Other Ambulatory Visit (HOSPITAL_COMMUNITY): Payer: Self-pay

## 2024-01-28 ENCOUNTER — Ambulatory Visit: Payer: BC Managed Care – PPO | Admitting: Dietician

## 2024-02-02 ENCOUNTER — Encounter: Attending: Dietician | Admitting: Dietician

## 2024-02-02 ENCOUNTER — Encounter: Payer: Self-pay | Admitting: Dietician

## 2024-02-02 DIAGNOSIS — E669 Obesity, unspecified: Secondary | ICD-10-CM | POA: Insufficient documentation

## 2024-02-02 NOTE — Progress Notes (Signed)
 Bariatric Nutrition Follow-Up Visit Medical Nutrition Therapy  Appt Start Time: 1423  End Time: 1446  Virtual Visit via Video Note  I connected with pt virtually and verified that I am speaking with the correct person using two identifiers.   Surgery date: 01/27/2023 Surgery type: Sleeve Gastrectomy  NUTRITION ASSESSMENT  Anthropometrics  Start weight at [facility]: 324.3 lbs (date: 11/24/2022)  Height: 65 in Weight today: pt reports 259.8 lbs   Clinical   Medical hx: obesity, HTN Medications: see list  Labs: A1c 6.1, Vit B12, iron 26, iron saturation 10, LDL 121, glucose 109, WBC 88.6 Notable signs/symptoms: none noted Any previous deficiencies? No Bowel Habits: Every day to every other day no complaints   Body Composition Scale 01/27/2023 04/30/2023 08/03/2023  Current Body Weight 302.1 277.7 263.7  Total Body Fat % 48.3 46.6 45.4  Visceral Fat 18 16 15   Fat-Free Mass % 51.6 53.3 54.5   Total Body Water  % 40.3 41.1 41.7  Muscle-Mass lbs 33.9 33.6 33.4  BMI 49.9 45.9 43.5  Body Fat Displacement            Torso  lbs 90.7 80.3 74.2         Left Leg  lbs 18.1 16.0 14.8         Right Leg  lbs 18.1 16.0 14.8         Left Arm  lbs 9.0 8.0 7.4         Right Arm  lbs 9.0 8.0 7.4     Lifestyle & Dietary Hx  Pt states she is intentional with getting protein with every meal or snack. Pt states she is going to her doctor on Thursday, stating she will have her A1c checked. Pt states she is gravitating toward sweets.  Estimated daily fluid intake: 64 oz (most days) Estimated daily protein intake: 60 grams Supplements: multivitamin and calcium Current average weekly physical activity: walking 3-4 days per week at lunch hour, light weights at home 3-4 days per week 20-30 minutes.  24-Hr Dietary Recall First Meal: [place] sausage and grits or eggs, or plain greek yogurt with fruit or  whole grain thins or smoothie (protein powder, kale/spinach, frozen fruit) Snack: yogurt with  unflavored protein powder or granola and yogurt Second Meal: baked pork chops and broccoli or grilled chicken, asparagus and sweet potatoes Snack: yogurt or apples and peanut butter Third Meal: baked pork chops and broccoli Snack: not usually, sometimes popcorn or fruit Beverages: water  or water  with sf flavorings  Post-Op Goals/ Signs/ Symptoms Using straws: no Drinking while eating: no Chewing/swallowing difficulties: no Changes in vision: no Changes to mood/headaches: no Hair loss/changes to skin/nails: no Difficulty focusing/concentrating: no Sweating: no Limb weakness: no Dizziness/lightheadedness: no Palpitations: no  Carbonated/caffeinated beverages: no N/V/D/C/Gas: no Abdominal pain: no Dumping syndrome: no   NUTRITION DIAGNOSIS  Overweight/obesity ([place]-3.3) related to past poor dietary habits and physical inactivity as evidenced by completed bariatric surgery and following dietary guidelines for continued weight loss and healthy nutrition status.   NUTRITION INTERVENTION Nutrition counseling (C-1) and education (E-2) to facilitate bariatric surgery goals, including: Diet advancement to the standard prep plan The importance of consuming adequate calories as well as certain nutrients daily due to the body's need for essential vitamins, minerals, and fats The importance of daily physical activity and to reach a goal of at least 150 minutes of moderate to vigorous physical activity weekly (or as directed by their physician) due to benefits such as increased musculature and improved lab  values The importance of intuitive eating specifically learning hunger-satiety cues and understanding the importance of learning a new body: The importance of mindful eating to avoid grazing behaviors  Why you need complex carbohydrates: Whole grains and other complex carbohydrates are required to have a healthy diet. Whole grains provide fiber which can help with blood glucose levels and help keep  you satiated. Fruits and starchy vegetables provide essential vitamins and minerals required for immune function, eyesight support, brain support, bone density, wound healing and many other functions within the body. According to the current evidenced based 2020-2025 Dietary Guidelines for Americans, complex carbohydrates are part of a healthy eating pattern which is associated with a decreased risk for type 2 diabetes, cancers, and cardiovascular disease.  Avoiding sugar after bariatric surgery is one of the most critical dietary changes for long-term success, primarily to prevent dumping syndrome and weight regain. Due to the altered digestive anatomy, consuming simple sugars causes them to dump rapidly into the small intestine. This rapid influx triggers a cascade of unpleasant symptoms, known as dumping syndrome, which can include nausea, cramping, diarrhea, sweating, dizziness, and a rapid heartbeat. Beyond immediate discomfort, consistently consuming sugar contributes to high-calorie intake with minimal nutritional value, making it easier to consume excess calories without feeling full. This undermines the surgical restriction and directly increases the risk of weight regain, sabotaging the long-term health benefits of the surgery.  Goals Re-engage: Track Protein; aim for protein from food Continue: Increase physical activity; add in resistance activities (resistance bands and kettle bells at home); gym membership for after BELT is over Continue: meal planning and prepping New: avoid refined sugar and other simple carbohydrates  Handouts Provided Include  Emailed 1 Year Maintenance Plan  Learning Style & Readiness for Change Teaching method utilized: Visual & Auditory  Demonstrated degree of understanding via: Teach Back  Readiness Level: Action Barriers to learning/adherence to lifestyle change: nothing identified  RD's Notes for Next Visit Assess adherence to pt chosen  goals  MONITORING & EVALUATION Dietary intake, weekly physical activity, body weight.  Next Steps Patient is to return in 3 months for follow-up.

## 2024-02-04 ENCOUNTER — Ambulatory Visit: Payer: BC Managed Care – PPO | Admitting: Family Medicine

## 2024-02-04 ENCOUNTER — Encounter: Payer: Self-pay | Admitting: Family Medicine

## 2024-02-04 ENCOUNTER — Ambulatory Visit: Payer: Self-pay | Admitting: Family Medicine

## 2024-02-04 ENCOUNTER — Other Ambulatory Visit (HOSPITAL_COMMUNITY): Payer: Self-pay

## 2024-02-04 VITALS — BP 124/78 | HR 67 | Temp 97.7°F | Ht 65.0 in | Wt 260.4 lb

## 2024-02-04 DIAGNOSIS — E7800 Pure hypercholesterolemia, unspecified: Secondary | ICD-10-CM

## 2024-02-04 DIAGNOSIS — M7552 Bursitis of left shoulder: Secondary | ICD-10-CM | POA: Diagnosis not present

## 2024-02-04 DIAGNOSIS — I10 Essential (primary) hypertension: Secondary | ICD-10-CM

## 2024-02-04 DIAGNOSIS — R7303 Prediabetes: Secondary | ICD-10-CM

## 2024-02-04 LAB — COMPREHENSIVE METABOLIC PANEL WITH GFR
ALT: 12 U/L (ref 0–35)
AST: 17 U/L (ref 0–37)
Albumin: 3.8 g/dL (ref 3.5–5.2)
Alkaline Phosphatase: 76 U/L (ref 39–117)
BUN: 21 mg/dL (ref 6–23)
CO2: 33 meq/L — ABNORMAL HIGH (ref 19–32)
Calcium: 9.1 mg/dL (ref 8.4–10.5)
Chloride: 102 meq/L (ref 96–112)
Creatinine, Ser: 1.04 mg/dL (ref 0.40–1.20)
GFR: 67.54 mL/min (ref >60.00)
Glucose, Bld: 91 mg/dL (ref 70–99)
Potassium: 4.1 meq/L (ref 3.5–5.1)
Sodium: 137 meq/L (ref 135–145)
Total Bilirubin: 0.4 mg/dL (ref 0.2–1.2)
Total Protein: 6.9 g/dL (ref 6.0–8.3)

## 2024-02-04 LAB — CBC
HCT: 40 % (ref 36.0–46.0)
Hemoglobin: 12.8 g/dL (ref 12.0–15.0)
MCHC: 31.9 g/dL (ref 30.0–36.0)
MCV: 84.6 fl (ref 78.0–100.0)
Platelets: 305 K/uL (ref 150.0–400.0)
RBC: 4.72 Mil/uL (ref 3.87–5.11)
RDW: 13.8 % (ref 11.5–15.5)
WBC: 8.8 K/uL (ref 4.0–10.5)

## 2024-02-04 LAB — LDL CHOLESTEROL, DIRECT: Direct LDL: 97 mg/dL

## 2024-02-04 LAB — HEMOGLOBIN A1C: Hgb A1c MFr Bld: 5.9 % (ref 4.6–6.5)

## 2024-02-04 MED ORDER — AMLODIPINE BESYLATE 10 MG PO TABS
10.0000 mg | ORAL_TABLET | Freq: Every day | ORAL | 1 refills | Status: DC
  Filled 2024-02-04: qty 90, 90d supply, fill #0
  Filled 2024-05-02: qty 90, 90d supply, fill #1

## 2024-02-04 NOTE — Progress Notes (Signed)
 Established Patient Office Visit   Subjective:  Patient ID: Courtney Mooney, female    DOB: [birth year]  Age: 39 y.o. MRN: [id number]  Chief Complaint  Patient presents with   Follow-up    6 months left shoulder pain, [place] letter [place].    HPI Encounter Diagnoses  Name Primary?   Prediabetes Yes   Essential hypertension    Bursitis of left shoulder    Elevated LDL cholesterol level    Continues weight loss efforts after gastric bypass.  Brings in a list of blood pressures that reveal systolic pressures in the 120-130 range over upper 80s to 90s.  Ongoing discomfort in left shoulder.  Right-hand-dominant.  Continues follow-up with general surgery after gastric bypass.   Review of Systems  Constitutional: Negative.   HENT: Negative.    Eyes:  Negative for blurred vision, discharge and redness.  Respiratory: Negative.    Cardiovascular: Negative.   Gastrointestinal:  Negative for abdominal pain.  Genitourinary: Negative.   Musculoskeletal:  Positive for joint pain. Negative for myalgias.  Skin:  Negative for rash.  Neurological:  Negative for tingling, loss of consciousness and weakness.  Endo/Heme/Allergies:  Negative for polydipsia.     Current Outpatient Medications:    amLODipine  (NORVASC ) 10 MG tablet, Take 1 tablet (10 mg total) by mouth daily., Disp: 90 tablet, Rfl: 1   calcium citrate (CALCITRATE - DOSED IN MG ELEMENTAL CALCIUM) 950 (200 Ca) MG tablet, Take 12.5 mg of elemental calcium by mouth 3 (three) times daily., Disp: , Rfl:    cetirizine (ZYRTEC) 10 MG tablet, Take 10 mg by mouth daily., Disp: , Rfl:    famotidine  (PEPCID ) 20 MG tablet, Take 20 mg by mouth 1 day or 1 dose., Disp: , Rfl:    Multiple Vitamins-Minerals (BARIATRIC MULTIVITAMINS) TABS, Take 500 1e11 [employer] by mouth 3 (three) times daily., Disp: , Rfl:    FERROUS SULFATE PO, Take 1 tablet by mouth daily. (Patient not taking: Reported on 08/07/2023), Disp: ,  Rfl:    Multiple Vitamin (MULTIVITAMIN) capsule, Take 1 capsule by mouth daily. (Patient not taking: Reported on 02/04/2024), Disp: , Rfl:    Objective:     BP 124/78 (BP Location: Right Arm, Patient Position: Sitting, Cuff Size: Normal)   Pulse 67   Temp 97.7 F (36.5 C) (Temporal)   Ht 5' 5 (1.651 m)   Wt 260 lb 6.4 oz (118.1 kg)   SpO2 99%   BMI 43.33 kg/m  BP Readings from Last 3 Encounters:  02/04/24 124/78  08/07/23 124/78  02/13/23 118/82   Wt Readings from Last 3 Encounters:  02/04/24 260 lb 6.4 oz (118.1 kg)  08/07/23 266 lb (120.7 kg)  08/03/23 263 lb 11.2 oz (119.6 kg)      Physical Exam Constitutional:      General: She is not in acute distress.    Appearance: Normal appearance. She is not ill-appearing, toxic-appearing or diaphoretic.  HENT:     Head: Normocephalic and atraumatic.     Right Ear: External ear normal.     Left Ear: External ear normal.  Eyes:     General: No scleral icterus.       Right eye: No discharge.        Left eye: No discharge.     Extraocular Movements: Extraocular movements intact.     Conjunctiva/sclera: Conjunctivae normal.     Pupils: Pupils are equal, round, and reactive to light.  Cardiovascular:  Rate and Rhythm: Normal rate and regular rhythm.  Pulmonary:     Effort: Pulmonary effort is normal. No respiratory distress.     Breath sounds: Normal breath sounds.  Abdominal:     General: Bowel sounds are normal.  Musculoskeletal:     Left shoulder: Tenderness (Tenderness in the bicipital groove in the subacromial bursa) present. Normal range of motion.     Cervical back: No rigidity or tenderness.  Skin:    General: Skin is warm and dry.  Neurological:     Mental Status: She is alert and oriented to person, place, and time.  Psychiatric:        Mood and Affect: Mood normal.        Behavior: Behavior normal.   1 year problems   No results found for any visits on 02/04/24.    The ASCVD Risk score (Arnett DK,  et al., 2019) failed to calculate for the following reasons:   The 2019 ASCVD risk score is only valid for ages 57 to 35    Assessment & Plan:   Prediabetes -     Comprehensive metabolic panel with GFR -     Hemoglobin A1c  Essential hypertension -     amLODIPine  Besylate; Take 1 tablet (10 mg total) by mouth daily.  Dispense: 90 tablet; Refill: 1 -     CBC -     Comprehensive metabolic panel with GFR  Bursitis of left shoulder -     Ambulatory referral to Sports Medicine  Elevated LDL cholesterol level -     LDL cholesterol, direct    Return in about 6 months (around 08/06/2024), or if symptoms worsen or fail to improve.  Continue weight loss efforts and exercise.  Have increased amlodipine  to 10 mg daily.  Look out for swelling in the lower extremities.  Continue follow-up with surgery for post gastric bypass labs.  Sports medicine referral for subacromial bursitis. Elsie Sim Lent, MD

## 2024-02-09 ENCOUNTER — Ambulatory Visit: Admitting: Sports Medicine

## 2024-02-09 ENCOUNTER — Encounter: Payer: Self-pay | Admitting: Sports Medicine

## 2024-02-09 VITALS — BP 118/72 | Ht 65.0 in | Wt 260.0 lb

## 2024-02-09 DIAGNOSIS — M67912 Unspecified disorder of synovium and tendon, left shoulder: Secondary | ICD-10-CM | POA: Diagnosis not present

## 2024-02-09 MED ORDER — METHYLPREDNISOLONE ACETATE 40 MG/ML IJ SUSP
40.0000 mg | Freq: Once | INTRAMUSCULAR | Status: AC
Start: 2024-02-09 — End: 2024-02-09
  Administered 2024-02-09: 40 mg via INTRA_ARTICULAR

## 2024-02-09 NOTE — Progress Notes (Signed)
   Subjective:    Patient ID: Courtney Mooney, female    DOB: 1986-03-30, 38 y.o.   MRN: [id number]  HPI chief complaint: Left shoulder pain  Patient is a very pleasant right-hand-dominant 38 year old female that presents today with over 6 months of left lateral shoulder pain.  No trauma that she can recall.  Pain is worse with any sort of shoulder motion.  She has tried ibuprofen , Voltaren  gel, and heat.  These have been minimally helpful.  She denies similar issues in the past.  No recent shoulder imaging.  No prior shoulder surgery.  Past medical history reviewed Medications reviewed Allergies reviewed  Review of Systems As above    Objective:   Physical Exam  Well-developed, well-nourished.  No acute distress  Left shoulder: Full range of motion with a positive painful arc.  No soft tissue swelling.  Positive empty can, positive Hawkins.  Rotator cuff strength is 5/5 but does reproduce pain.  Neurovascularly intact distally.      Assessment & Plan:   Left shoulder pain secondary to rotator cuff tendinopathy/subacromial bursitis  Left subacromial space is injected with cortisone today.  This is accomplished atraumatically under sterile technique.  She tolerates this without difficulty.  She will start physical therapy at our [facility] outpatient facility in [place].  If symptoms persist despite physical therapy then consider imaging at that time.  Follow-up for ongoing or recalcitrant issues.  Consent obtained and verified. Time-out conducted. Noted no overlying erythema, induration, or other signs of local infection. Skin prepped in a sterile fashion. Topical analgesic spray: Ethyl chloride. Joint: Left shoulder, subacromial Needle: 25-gauge 1.5 inch Completed without difficulty. Meds: 3 cc 1% Xylocaine , 1 cc (40 mg) Depo-Medrol   This note was dictated using Dragon naturally speaking software and may contain errors in syntax, spelling, or content which have not been  identified prior to signing this note.

## 2024-04-22 DIAGNOSIS — E569 Vitamin deficiency, unspecified: Secondary | ICD-10-CM | POA: Diagnosis not present

## 2024-04-22 DIAGNOSIS — Z9884 Bariatric surgery status: Secondary | ICD-10-CM | POA: Diagnosis not present

## 2024-04-29 ENCOUNTER — Encounter: Payer: Self-pay | Admitting: Family Medicine

## 2024-05-12 ENCOUNTER — Ambulatory Visit: Admitting: Family Medicine

## 2024-08-07 ENCOUNTER — Other Ambulatory Visit: Payer: Self-pay | Admitting: Family Medicine

## 2024-08-07 DIAGNOSIS — I10 Essential (primary) hypertension: Secondary | ICD-10-CM

## 2024-08-08 ENCOUNTER — Other Ambulatory Visit: Payer: Self-pay

## 2024-08-08 ENCOUNTER — Other Ambulatory Visit (HOSPITAL_COMMUNITY): Payer: Self-pay

## 2024-08-08 ENCOUNTER — Encounter: Payer: Self-pay | Admitting: Family Medicine

## 2024-08-08 ENCOUNTER — Ambulatory Visit (INDEPENDENT_AMBULATORY_CARE_PROVIDER_SITE_OTHER): Admitting: Family Medicine

## 2024-08-08 VITALS — BP 124/83 | HR 84 | Temp 98.6°F | Resp 16 | Ht 65.0 in | Wt 265.6 lb

## 2024-08-08 DIAGNOSIS — Z1231 Encounter for screening mammogram for malignant neoplasm of breast: Secondary | ICD-10-CM

## 2024-08-08 DIAGNOSIS — Z131 Encounter for screening for diabetes mellitus: Secondary | ICD-10-CM

## 2024-08-08 DIAGNOSIS — I10 Essential (primary) hypertension: Secondary | ICD-10-CM | POA: Diagnosis not present

## 2024-08-08 DIAGNOSIS — Z6841 Body Mass Index (BMI) 40.0 and over, adult: Secondary | ICD-10-CM

## 2024-08-08 DIAGNOSIS — Z0000 Encounter for general adult medical examination without abnormal findings: Secondary | ICD-10-CM | POA: Diagnosis not present

## 2024-08-08 DIAGNOSIS — Z13220 Encounter for screening for lipoid disorders: Secondary | ICD-10-CM

## 2024-08-08 LAB — URINALYSIS, ROUTINE W REFLEX MICROSCOPIC
Bilirubin Urine: NEGATIVE
Leukocytes,Ua: NEGATIVE
Nitrite: NEGATIVE
Specific Gravity, Urine: 1.01 (ref 1.000–1.030)
Total Protein, Urine: NEGATIVE
Urine Glucose: NEGATIVE
Urobilinogen, UA: 0.2 (ref 0.0–1.0)
pH: 7 (ref 5.0–8.0)

## 2024-08-08 LAB — COMPREHENSIVE METABOLIC PANEL WITH GFR
ALT: 10 U/L (ref 3–35)
AST: 16 U/L (ref 5–37)
Albumin: 3.8 g/dL (ref 3.5–5.2)
Alkaline Phosphatase: 78 U/L (ref 39–117)
BUN: 15 mg/dL (ref 6–23)
CO2: 29 meq/L (ref 19–32)
Calcium: 8.9 mg/dL (ref 8.4–10.5)
Chloride: 103 meq/L (ref 96–112)
Creatinine, Ser: 1 mg/dL (ref 0.40–1.20)
GFR: 70.54 mL/min
Glucose, Bld: 92 mg/dL (ref 70–99)
Potassium: 3.7 meq/L (ref 3.5–5.1)
Sodium: 137 meq/L (ref 135–145)
Total Bilirubin: 0.4 mg/dL (ref 0.2–1.2)
Total Protein: 7.1 g/dL (ref 6.0–8.3)

## 2024-08-08 LAB — CBC WITH DIFFERENTIAL/PLATELET
Basophils Absolute: 0.1 K/uL (ref 0.0–0.1)
Basophils Relative: 1.1 % (ref 0.0–3.0)
Eosinophils Absolute: 0 K/uL (ref 0.0–0.7)
Eosinophils Relative: 0.5 % (ref 0.0–5.0)
HCT: 40.4 % (ref 36.0–46.0)
Hemoglobin: 13.1 g/dL (ref 12.0–15.0)
Lymphocytes Relative: 18 % (ref 12.0–46.0)
Lymphs Abs: 1.5 K/uL (ref 0.7–4.0)
MCHC: 32.4 g/dL (ref 30.0–36.0)
MCV: 84.4 fl (ref 78.0–100.0)
Monocytes Absolute: 0.5 K/uL (ref 0.1–1.0)
Monocytes Relative: 6.4 % (ref 3.0–12.0)
Neutro Abs: 6 K/uL (ref 1.4–7.7)
Neutrophils Relative %: 74 % (ref 43.0–77.0)
Platelets: 325 K/uL (ref 150.0–400.0)
RBC: 4.78 Mil/uL (ref 3.87–5.11)
RDW: 13.6 % (ref 11.5–15.5)
WBC: 8.2 K/uL (ref 4.0–10.5)

## 2024-08-08 LAB — LIPID PANEL
Cholesterol: 165 mg/dL (ref 28–200)
HDL: 46.5 mg/dL
LDL Cholesterol: 109 mg/dL — ABNORMAL HIGH (ref 10–99)
NonHDL: 118.06
Total CHOL/HDL Ratio: 4
Triglycerides: 43 mg/dL (ref 10.0–149.0)
VLDL: 8.6 mg/dL (ref 0.0–40.0)

## 2024-08-08 LAB — HEMOGLOBIN A1C: Hgb A1c MFr Bld: 5.7 % (ref 4.6–6.5)

## 2024-08-08 MED ORDER — AMLODIPINE BESYLATE 10 MG PO TABS
10.0000 mg | ORAL_TABLET | Freq: Every day | ORAL | 1 refills | Status: AC
  Filled 2024-08-08: qty 90, 90d supply, fill #0

## 2024-08-08 MED ORDER — HYDROCHLOROTHIAZIDE 25 MG PO TABS
25.0000 mg | ORAL_TABLET | Freq: Every day | ORAL | 3 refills | Status: AC
  Filled 2024-08-08: qty 90, 90d supply, fill #0

## 2024-08-08 NOTE — Progress Notes (Signed)
 "  Established Patient Office Visit   Subjective:  Patient ID: Courtney Mooney, female    DOB: [birth year]  Age: 39 y.o. MRN: [id number]  Chief Complaint  Patient presents with   Annual Exam    HPI Encounter Diagnoses  Name Primary?   Healthcare maintenance Yes   Breast cancer screening by mammogram    BMI 40.0-44.9, adult (HCC)    Screening for cholesterol level    Screening for diabetes mellitus    Essential hypertension    For physical and general follow-up.  Recently engaged.  She continues her weight loss [facility].  Status post gastric sleeve in 7/24.  Recently restarting exercise and improving diet.  She is planning on working with a nutritionist.  Continues to work at [employer].  Continues amlodipine  10 mg for hypertension.  Systolic pressures have been in the less than 130 range.  Diastolic pressures have been averaging up into the 80s.   Review of Systems  Constitutional: Negative.   HENT: Negative.    Eyes:  Negative for blurred vision, discharge and redness.  Respiratory: Negative.    Cardiovascular: Negative.   Gastrointestinal:  Negative for abdominal pain.  Genitourinary: Negative.   Musculoskeletal: Negative.  Negative for myalgias.  Skin:  Negative for rash.  Neurological:  Negative for tingling, loss of consciousness and weakness.  Endo/Heme/Allergies:  Negative for polydipsia.      08/08/2024    8:17 AM 02/04/2024    8:57 AM 11/24/2022    8:17 AM  Depression screen PHQ 2/9  Decreased Interest 0 0 0  Down, Depressed, Hopeless 0 0 0  PHQ - 2 Score 0 0 0  Altered sleeping 0 0   Tired, decreased energy 0 1   Change in appetite 0 1   Feeling bad or failure about yourself  0 0   Trouble concentrating 0 0   Moving slowly or fidgety/restless 0 0   Suicidal thoughts 0 0   PHQ-9 Score 0 2    Difficult doing work/chores Not difficult at all Not difficult at all      Data saved with a previous flowsheet row definition     Current Medications[1]    Objective:     BP 124/83 (BP Location: Left Arm, Patient Position: Sitting, Cuff Size: Large)   Pulse 84   Temp 98.6 F (37 C) (Oral)   Resp 16   Ht 5' 5 (1.651 m)   Wt 265 lb 9.6 oz (120.5 kg)   SpO2 100%   BMI 44.20 kg/m  BP Readings from Last 3 Encounters:  08/08/24 124/83  02/09/24 118/72  02/04/24 124/78   Wt Readings from Last 3 Encounters:  08/08/24 265 lb 9.6 oz (120.5 kg)  02/09/24 260 lb (117.9 kg)  02/04/24 260 lb 6.4 oz (118.1 kg)      Physical Exam Constitutional:      General: She is not in acute distress.    Appearance: Normal appearance. She is not ill-appearing, toxic-appearing or diaphoretic.  HENT:     Head: Normocephalic and atraumatic.     Right Ear: Tympanic membrane, ear canal and external ear normal.     Left Ear: Tympanic membrane, ear canal and external ear normal.     Mouth/Throat:     Mouth: Mucous membranes are moist.     Pharynx: Oropharynx is clear. No oropharyngeal exudate or posterior oropharyngeal erythema.  Eyes:     General: No scleral icterus.       Right eye: No discharge.  Left eye: No discharge.     Extraocular Movements: Extraocular movements intact.     Conjunctiva/sclera: Conjunctivae normal.     Pupils: Pupils are equal, round, and reactive to light.  Cardiovascular:     Rate and Rhythm: Normal rate and regular rhythm.  Pulmonary:     Effort: Pulmonary effort is normal. No respiratory distress.     Breath sounds: Normal breath sounds. No wheezing, rhonchi or rales.  Abdominal:     General: Bowel sounds are normal.  Musculoskeletal:     Cervical back: No rigidity or tenderness.  Lymphadenopathy:     Cervical: No cervical adenopathy.  Skin:    General: Skin is warm and dry.  Neurological:     Mental Status: She is alert and oriented to person, place, and time.  Psychiatric:        Mood and Affect: Mood normal.        Behavior: Behavior normal.      No results found for any visits on  08/08/24.    The 10-year ASCVD risk score (Arnett DK, et al., 2019) is: 3.6%    Assessment & Plan:   Healthcare maintenance -     CBC with Differential/Platelet -     Urinalysis, Routine w reflex microscopic  Breast cancer screening by mammogram  BMI 40.0-44.9, adult (HCC)  Screening for cholesterol level -     Lipid panel -     Comprehensive metabolic panel with GFR  Screening for diabetes mellitus -     Hemoglobin A1c -     Comprehensive metabolic panel with GFR  Essential hypertension -     hydroCHLOROthiazide ; Take 1 tablet (25 mg total) by mouth daily.  Dispense: 90 tablet; Refill: 3 -     amLODIPine  Besylate; Take 1 tablet (10 mg total) by mouth daily.  Dispense: 90 tablet; Refill: 1    Return in about 6 months (around 02/05/2025).  Continue amlodipine .  Add HCTZ.  Encouraged regular exercise for 30 minutes daily.  She will start back to work with her nutritionist.  The copayment at healthy weight management is a barrier for her.  Information given on health maintenance and disease prevention.  Information was given about the mobile mammogram and she will schedule an appointment.  Elsie Sim Lent, MD  08/11/24 With menses for urine collection.      [1]  Current Outpatient Medications:    calcium citrate (CALCITRATE - DOSED IN MG ELEMENTAL CALCIUM) 950 (200 Ca) MG tablet, Take 12.5 mg of elemental calcium by mouth 3 (three) times daily., Disp: , Rfl:    cetirizine (ZYRTEC) 10 MG tablet, Take 10 mg by mouth daily., Disp: , Rfl:    hydrochlorothiazide  (HYDRODIURIL ) 25 MG tablet, Take 1 tablet (25 mg total) by mouth daily., Disp: 90 tablet, Rfl: 3   Multiple Vitamin (MULTIVITAMIN) capsule, Take 1 capsule by mouth daily., Disp: , Rfl:    Multiple Vitamins-Minerals (BARIATRIC MULTIVITAMINS) TABS, Take 500 1e11 [employer] by mouth 3 (three) times daily., Disp: , Rfl:    omeprazole (PRILOSEC) 40 MG capsule, Take 40 mg by mouth daily., Disp: , Rfl:    amLODipine   (NORVASC ) 10 MG tablet, Take 1 tablet (10 mg total) by mouth daily., Disp: 90 tablet, Rfl: 1  "

## 2024-08-09 ENCOUNTER — Ambulatory Visit: Payer: Self-pay | Admitting: Family Medicine

## 2024-08-09 ENCOUNTER — Other Ambulatory Visit (HOSPITAL_COMMUNITY): Payer: Self-pay

## 2024-08-16 ENCOUNTER — Telehealth: Payer: Self-pay

## 2024-08-16 NOTE — Telephone Encounter (Signed)
 Copied from CRM #[id number]. Topic: Appointments - Transfer of Care >> Aug 16, 2024  4:01 PM Alexandria E wrote: Pt is requesting to transfer FROM: Dr. Elsie Lent Pt is requesting to transfer TO: Dr. Reuben Burkes Reason for requested transfer: Patient said her current PCP will be retiring soon. It is the responsibility of the team the patient would like to transfer to (Dr. Reuben Burkes) to reach out to the patient if for any reason this transfer is not acceptable.

## 2024-08-20 NOTE — Telephone Encounter (Signed)
 Ok for [employer]

## 2024-09-12 ENCOUNTER — Encounter

## 2025-02-06 ENCOUNTER — Ambulatory Visit: Admitting: Family Medicine

## 2025-02-08 ENCOUNTER — Encounter
